# Patient Record
Sex: Male | Born: 1935
Health system: Southern US, Community
[De-identification: ages and names within clinical notes are randomized; demographics above are authoritative.]

## PROBLEM LIST (undated history)

## (undated) DIAGNOSIS — I10 Essential (primary) hypertension: Secondary | ICD-10-CM

## (undated) DIAGNOSIS — C801 Malignant (primary) neoplasm, unspecified: Secondary | ICD-10-CM

## (undated) DIAGNOSIS — N189 Chronic kidney disease, unspecified: Secondary | ICD-10-CM

## (undated) DIAGNOSIS — E785 Hyperlipidemia, unspecified: Secondary | ICD-10-CM

## (undated) DIAGNOSIS — J189 Pneumonia, unspecified organism: Secondary | ICD-10-CM

## (undated) DIAGNOSIS — E039 Hypothyroidism, unspecified: Secondary | ICD-10-CM

## (undated) DIAGNOSIS — M549 Dorsalgia, unspecified: Secondary | ICD-10-CM

## (undated) HISTORY — DX: Dorsalgia, unspecified: M54.9

## (undated) HISTORY — PX: BACK SURGERY: SHX140

## (undated) HISTORY — PX: COLONOSCOPY: SHX174

## (undated) HISTORY — DX: Chronic kidney disease, unspecified: N18.9

## (undated) HISTORY — PX: CARPAL TUNNEL RELEASE: SHX101

## (undated) HISTORY — DX: Hyperlipidemia, unspecified: E78.5

## (undated) HISTORY — PX: OTHER SURGICAL HISTORY: SHX169

## (undated) HISTORY — PX: HERNIA REPAIR: SHX51

---

## 1998-10-25 ENCOUNTER — Encounter: Payer: Self-pay | Admitting: Family Medicine

## 1998-10-25 ENCOUNTER — Ambulatory Visit (HOSPITAL_COMMUNITY): Admission: RE | Admit: 1998-10-25 | Discharge: 1998-10-25 | Payer: Self-pay | Admitting: Family Medicine

## 1998-11-23 ENCOUNTER — Encounter: Payer: Self-pay | Admitting: Neurosurgery

## 1998-11-23 ENCOUNTER — Ambulatory Visit (HOSPITAL_COMMUNITY): Admission: RE | Admit: 1998-11-23 | Discharge: 1998-11-23 | Payer: Self-pay | Admitting: Neurosurgery

## 1998-11-30 ENCOUNTER — Ambulatory Visit (HOSPITAL_COMMUNITY): Admission: RE | Admit: 1998-11-30 | Discharge: 1998-11-30 | Payer: Self-pay | Admitting: Neurosurgery

## 1998-11-30 ENCOUNTER — Encounter: Payer: Self-pay | Admitting: Neurosurgery

## 1998-12-15 ENCOUNTER — Ambulatory Visit (HOSPITAL_COMMUNITY): Admission: RE | Admit: 1998-12-15 | Discharge: 1998-12-15 | Payer: Self-pay | Admitting: Neurosurgery

## 2002-12-15 ENCOUNTER — Ambulatory Visit (HOSPITAL_COMMUNITY): Admission: RE | Admit: 2002-12-15 | Discharge: 2002-12-15 | Payer: Self-pay | Admitting: Gastroenterology

## 2002-12-15 ENCOUNTER — Encounter (INDEPENDENT_AMBULATORY_CARE_PROVIDER_SITE_OTHER): Payer: Self-pay | Admitting: *Deleted

## 2008-07-21 ENCOUNTER — Ambulatory Visit (HOSPITAL_COMMUNITY): Admission: RE | Admit: 2008-07-21 | Discharge: 2008-07-21 | Payer: Self-pay | Admitting: Urology

## 2008-07-22 ENCOUNTER — Ambulatory Visit: Admission: RE | Admit: 2008-07-22 | Discharge: 2008-10-20 | Payer: Self-pay | Admitting: Radiation Oncology

## 2008-09-02 ENCOUNTER — Ambulatory Visit (HOSPITAL_BASED_OUTPATIENT_CLINIC_OR_DEPARTMENT_OTHER): Admission: RE | Admit: 2008-09-02 | Discharge: 2008-09-02 | Payer: Self-pay | Admitting: Urology

## 2009-12-30 ENCOUNTER — Ambulatory Visit (HOSPITAL_COMMUNITY): Admission: RE | Admit: 2009-12-30 | Discharge: 2009-12-30 | Payer: Self-pay | Admitting: General Surgery

## 2011-03-27 LAB — DIFFERENTIAL
Eosinophils Relative: 4 % (ref 0–5)
Lymphocytes Relative: 22 % (ref 12–46)
Lymphs Abs: 2.1 10*3/uL (ref 0.7–4.0)
Monocytes Absolute: 0.8 10*3/uL (ref 0.1–1.0)

## 2011-03-27 LAB — BASIC METABOLIC PANEL
GFR calc Af Amer: >60 mL/min (ref >60)
GFR calc non Af Amer: 54 mL/min — ABNORMAL LOW (ref >60)
Potassium: 4.8 mEq/L (ref 3.5–5.1)
Sodium: 141 mEq/L (ref 135–145)

## 2011-03-27 LAB — CBC
HCT: 42.7 % (ref 39.0–52.0)
Hemoglobin: 14.3 g/dL (ref 13.0–17.0)
Platelets: 253 10*3/uL (ref 150–400)
WBC: 9.7 10*3/uL (ref 4.0–10.5)

## 2011-05-09 NOTE — Op Note (Signed)
NAMEJADARRIAN, Keith Wiley               ACCOUNT NO.:  [id number]   MEDICAL RECORD NO.:  [id number]          PATIENT TYPE:  AMB   LOCATION:  [facility]                         FACILITY:  [facility]:  Corky Downs, M.D.DATE OF BIRTH:  1936-03-13   DATE OF PROCEDURE:  09/02/2008  DATE OF DISCHARGE:                               OPERATIVE REPORT   PREOPERATIVE DIAGNOSIS:  T2a Gleason 4 + 3 adenocarcinoma of prostate.   POSTOPERATIVE DIAGNOSIS:  T2a Gleason 4 + 3 adenocarcinoma of prostate.   OPERATION:  Prostate brachytherapy and cystoscopy.   ANESTHESIA:  General.   SURGEON:  Corky Downs, M.D.   ASSISTANT:  Arloa Koh, M.D. .   BRIEF HISTORY:  This 75 year old white male was admitted with a clinical  T2a Gleason 4 +3 adenocarcinoma of prostate for prostate brachytherapy  as definitive treatment.  His PSA was 6.2 in December.  He had a 40-gram  prostate.  Most of the biopsies were positive on both apex.  He had  previous biopsy 12 years ago that was negative.  He did have good  general health otherwise.   The patient was placed in dorsal lithotomy position, after satisfactory  induction of general endotracheal anesthesia, was prepped and draped  with Betadine and given IV Cipro.  Time out was then performed, and the  rectal tube, the Foley catheter and the 7.5 MHz prostate ultrasound  probe was inserted in the rectum.  Dr. Valere Dross then proceeded to plan the  treatment for the brachytherapy.   After this was done, I was called to the OR and began placing the  needles per protocol, sparing the rectum and the urethra as much as  possible.  A total of 68 seeds with 25 needles of 125 were used which  seemed to give a nice pattern on the post-therapy films.  This was done  with ultrasound guidance.  At the completion, the Foley catheter was  then removed, and a flexible cystoscope used to inspect the anterior  urethra.  No seeds were seen in the prostatic urethra.  The  bladder was  entered.  Bladder was also free of any seeds; even on the retrospective  view of the bladder neck, I could not see any loose seeds.  The bladder  was normal, 2+ trabeculated.  Scope removed, and a #16 Foley catheter  was replaced, and the patient taken to the recovery room in good  condition.  He will be later discharged as an outpatient to have his  catheter removed in 48 hours and come back to the office in 3 weeks for  both Dr. Valere Dross and Dr. Serita Butcher.      Corky Downs, M.D.  Electronically Signed    HMK/MEDQ  D:  09/02/2008  T:  09/02/2008  Job:  [id number]

## 2011-05-12 NOTE — Op Note (Signed)
   NAME:  Keith Wiley, Keith Wiley                         ACCOUNT NO.:  [id number]   MEDICAL RECORD NO.:  [id number]                   PATIENT TYPE:  AMB   LOCATION:  ENDO                                 FACILITY:  [facility]   PHYSICIAN:  Jeryl Columbia, M.D.                 DATE OF BIRTH:  07-27-1936   DATE OF PROCEDURE:  12/15/2002  DATE OF DISCHARGE:                                 OPERATIVE REPORT   PROCEDURE PERFORMED:  Colonoscopy with biopsy.   ENDOSCOPIST:  Jeryl Columbia, M.D.   INDICATIONS FOR PROCEDURE:  Screening and bright red blood per rectum.  Consent was signed after the risks, benefits, methods and options were  thoroughly discussed in the office.   MEDICINES USED:  Demerol 50 mg, Versed 4 mg.   DESCRIPTION OF PROCEDURE:  Rectal inspection was pertinent for external  hemorrhoids, small.  Digital exam was negative.  A video colonoscope was  inserted and easily advanced around the colon to the cecum.  This did  require some abdominal pressure but no position changes.  On insertion, no  abnormalities were seen.  The cecum was identified by the appendiceal  orifice and the ileocecal valve.  The scope was slowly withdrawn.  The prep  was adequate.  There was some liquid stool that required washing and  suctioning.  The cecum, ascending and majority of the transverse were  normal.  Beginning at about the splenic flexure, a few tiny hyperplastic  appearing polyps were seen both the splenic flexure, descending and sigmoid.  They were cold biopsied and put in the same container.  No other  abnormalities were seen as we slowly withdrew back to the rectum.  Once back  in the rectum, the scope was retroflexed pertinent for some internal  hemorrhoids.  Scope was straightened and readvanced a short ways up the left  side of the colon, air was suctioned, scope removed.  The patient tolerated  the procedure well without obvious complication.   ENDOSCOPIC DIAGNOSIS:  1. Internal and external  small hemorrhoids.  2. Left-sided probable hyperplastic appearing polyps cold biopsied.  3. Otherwise within normal limits to the cecum.   PLAN:  Await pathology to determine future colonic screening, yearly rectals  and guaiacs per Dr. Laurance Flatten, happy to see back p.r.n.                                               Jeryl Columbia, M.D.   MEM/MEDQ  D:  12/15/2002  T:  12/15/2002  Job:  [id number]   cc:   Chipper Herb, M.D.  [facility]  [place] [address]  Fax: [phone]

## 2011-09-27 LAB — COMPREHENSIVE METABOLIC PANEL
Albumin: 4
BUN: 25 — ABNORMAL HIGH
Chloride: 105
Creatinine, Ser: 1.5
GFR calc non Af Amer: 46 — ABNORMAL LOW
Glucose, Bld: 92
Total Bilirubin: 0.8

## 2011-09-27 LAB — URINALYSIS, ROUTINE W REFLEX MICROSCOPIC
Bilirubin Urine: NEGATIVE
Hgb urine dipstick: NEGATIVE
Ketones, ur: NEGATIVE
Nitrite: NEGATIVE
Protein, ur: NEGATIVE
Specific Gravity, Urine: 1.018
Urobilinogen, UA: 0.2

## 2011-09-27 LAB — CBC
HCT: 45.9
Hemoglobin: 15.2
MCV: 88.5
Platelets: 261
WBC: 7.5

## 2011-09-27 LAB — PROTIME-INR
INR: 1
Prothrombin Time: 12.9

## 2011-09-27 LAB — APTT: aPTT: 30

## 2012-03-29 ENCOUNTER — Other Ambulatory Visit: Payer: Self-pay | Admitting: Neurosurgery

## 2012-03-29 DIAGNOSIS — M5410 Radiculopathy, site unspecified: Secondary | ICD-10-CM

## 2012-03-29 DIAGNOSIS — M542 Cervicalgia: Secondary | ICD-10-CM

## 2012-04-01 ENCOUNTER — Ambulatory Visit
Admission: RE | Admit: 2012-04-01 | Discharge: 2012-04-01 | Disposition: A | Discharge status: Home / Self Care | Payer: Medicare Other | Source: Ambulatory Visit | Attending: Neurosurgery | Admitting: Neurosurgery

## 2012-04-01 VITALS — BP 148/79 | HR 83 | Ht 67.0 in | Wt 206.0 lb

## 2012-04-01 DIAGNOSIS — M5410 Radiculopathy, site unspecified: Secondary | ICD-10-CM

## 2012-04-01 DIAGNOSIS — M542 Cervicalgia: Secondary | ICD-10-CM

## 2012-04-01 MED ORDER — IOHEXOL 300 MG/ML  SOLN
10.0000 mL | Freq: Once | INTRAMUSCULAR | Status: AC | PRN
  Administered 2012-04-01: 10 mL via INTRATHECAL

## 2012-04-01 MED ORDER — DIAZEPAM 5 MG PO TABS
5.0000 mg | ORAL_TABLET | Freq: Once | ORAL | Status: AC
  Administered 2012-04-01: 5 mg via ORAL

## 2012-04-01 NOTE — Discharge Instructions (Signed)
Myelogram Discharge Instructions  1. Go home and rest quietly for the next 24 hours.  It is important to lie flat for the next 24 hours.  Get up only to go to the restroom.  You may lie in the bed or on a couch on your back, your stomach, your left side or your right side.  You may have one pillow under your head.  You may have pillows between your knees while you are on your side or under your knees while you are on your back.  2. DO NOT drive today.  Recline the seat as far back as it will go, while still wearing your seat belt, on the way home.  3. You may get up to go to the bathroom as needed.  You may sit up for 10 minutes to eat.  You may resume your normal diet and medications unless otherwise indicated.  Drink lots of extra fluids today and tomorrow.  4. The incidence of headache, nausea, or vomiting is about 5% (one in 20 patients).  If you develop a headache, lie flat and drink plenty of fluids until the headache goes away.  Caffeinated beverages may be helpful.  If you develop severe nausea and vomiting or a headache that does not go away with flat bed rest, call [phone].  5. You may resume normal activities after your 24 hours of bed rest is over; however, do not exert yourself strongly or do any heavy lifting tomorrow.  6. Call your physician for a follow-up appointment.  The results of your myelogram will be sent directly to your physician by the following day.  7. If you have any questions or if complications develop after you arrive home, please call [phone].  Discharge instructions have been explained to the patient.  The patient, or the person responsible for the patient, fully understands these instructions.

## 2012-04-02 ENCOUNTER — Other Ambulatory Visit (HOSPITAL_COMMUNITY): Payer: Self-pay | Admitting: Diagnostic Radiology

## 2012-05-02 ENCOUNTER — Other Ambulatory Visit: Payer: Self-pay | Admitting: Neurosurgery

## 2012-05-06 ENCOUNTER — Encounter (HOSPITAL_COMMUNITY): Payer: Self-pay

## 2012-05-07 ENCOUNTER — Encounter (HOSPITAL_COMMUNITY)
Admission: RE | Admit: 2012-05-07 | Discharge: 2012-05-07 | Disposition: A | Discharge status: Home / Self Care | Payer: Medicare Other | Source: Ambulatory Visit | Attending: Anesthesiology | Admitting: Anesthesiology

## 2012-05-07 ENCOUNTER — Encounter (HOSPITAL_COMMUNITY)
Admission: RE | Admit: 2012-05-07 | Discharge: 2012-05-07 | Disposition: A | Discharge status: Home / Self Care | Payer: Medicare Other | Source: Ambulatory Visit | Attending: Neurosurgery | Admitting: Neurosurgery

## 2012-05-07 ENCOUNTER — Encounter (HOSPITAL_COMMUNITY): Payer: Self-pay

## 2012-05-07 HISTORY — DX: Essential (primary) hypertension: I10

## 2012-05-07 HISTORY — DX: Hypothyroidism, unspecified: E03.9

## 2012-05-07 HISTORY — DX: Malignant (primary) neoplasm, unspecified: C80.1

## 2012-05-07 LAB — BASIC METABOLIC PANEL
Calcium: 10 mg/dL (ref 8.4–10.5)
GFR calc Af Amer: 31 mL/min — ABNORMAL LOW (ref >90)
GFR calc non Af Amer: 27 mL/min — ABNORMAL LOW (ref >90)
Sodium: 143 mEq/L (ref 135–145)

## 2012-05-07 LAB — CBC
Platelets: 293 10*3/uL (ref 150–400)
RBC: 4.88 MIL/uL (ref 4.22–5.81)
WBC: 19.8 10*3/uL — ABNORMAL HIGH (ref 4.0–10.5)

## 2012-05-07 LAB — SURGICAL PCR SCREEN: Staphylococcus aureus: POSITIVE — AB

## 2012-05-07 NOTE — Pre-Procedure Instructions (Signed)
[place]  05/07/2012   Your procedure is scheduled on:  05/08/12  Report to [facility] at 1000 AM.  Call this number if you have problems the morning of surgery: [phone]   Remember:   Do not eat food:After Midnight.  May have clear liquids: up to 4 Hours before arrival.  Clear liquids include soda, tea, black coffee, apple or grape juice, broth.  Take these medicines the morning of surgery with A SIP OF WATER: norvasc,synthroid   Do not wear jewelry, make-up or nail polish.  Do not wear lotions, powders, or perfumes. You may wear deodorant.  Do not shave 48 hours prior to surgery. Men may shave face and neck.  Do not bring valuables to the hospital.  Contacts, dentures or bridgework may not be worn into surgery.  Leave suitcase in the car. After surgery it may be brought to your room.  For patients admitted to the hospital, checkout time is 11:00 AM the day of discharge.   Patients discharged the day of surgery will not be allowed to drive home.  Name and phone number of your driver: family  Special Instructions: CHG Shower Use Special Wash: 1/2 bottle night before surgery and 1/2 bottle morning of surgery.   Please read over the following fact sheets that you were given: Pain Booklet, Coughing and Deep Breathing, MRSA Information and Surgical Site Infection Prevention

## 2012-05-14 ENCOUNTER — Encounter (HOSPITAL_COMMUNITY)
Admission: RE | Disposition: A | Discharge status: Home / Self Care | Payer: Self-pay | Source: Ambulatory Visit | Attending: Neurosurgery

## 2012-05-14 ENCOUNTER — Inpatient Hospital Stay (HOSPITAL_COMMUNITY): Payer: Medicare Other

## 2012-05-14 ENCOUNTER — Encounter (HOSPITAL_COMMUNITY): Payer: Self-pay | Admitting: *Deleted

## 2012-05-14 ENCOUNTER — Inpatient Hospital Stay (HOSPITAL_COMMUNITY): Payer: Medicare Other | Admitting: *Deleted

## 2012-05-14 ENCOUNTER — Inpatient Hospital Stay (HOSPITAL_COMMUNITY)
Admission: RE | Admit: 2012-05-14 | Discharge: 2012-05-17 | DRG: 473 | Disposition: A | Discharge status: Home / Self Care | Payer: Medicare Other | Source: Ambulatory Visit | Attending: Neurosurgery | Admitting: Neurosurgery

## 2012-05-14 DIAGNOSIS — M109 Gout, unspecified: Secondary | ICD-10-CM | POA: Diagnosis present

## 2012-05-14 DIAGNOSIS — Z79899 Other long term (current) drug therapy: Secondary | ICD-10-CM

## 2012-05-14 DIAGNOSIS — I10 Essential (primary) hypertension: Secondary | ICD-10-CM | POA: Diagnosis present

## 2012-05-14 DIAGNOSIS — M5412 Radiculopathy, cervical region: Secondary | ICD-10-CM | POA: Diagnosis present

## 2012-05-14 DIAGNOSIS — Z8546 Personal history of malignant neoplasm of prostate: Secondary | ICD-10-CM

## 2012-05-14 DIAGNOSIS — M5030 Other cervical disc degeneration, unspecified cervical region: Principal | ICD-10-CM | POA: Diagnosis present

## 2012-05-14 DIAGNOSIS — Z01810 Encounter for preprocedural cardiovascular examination: Secondary | ICD-10-CM

## 2012-05-14 DIAGNOSIS — Z7982 Long term (current) use of aspirin: Secondary | ICD-10-CM

## 2012-05-14 DIAGNOSIS — Z87891 Personal history of nicotine dependence: Secondary | ICD-10-CM

## 2012-05-14 DIAGNOSIS — Z01812 Encounter for preprocedural laboratory examination: Secondary | ICD-10-CM

## 2012-05-14 DIAGNOSIS — E039 Hypothyroidism, unspecified: Secondary | ICD-10-CM | POA: Diagnosis present

## 2012-05-14 SURGERY — ANTERIOR CERVICAL DECOMPRESSION/DISCECTOMY FUSION 4 LEVELS
Anesthesia: General | Site: Neck | Wound class: Clean

## 2012-05-14 MED ORDER — CEFAZOLIN SODIUM-DEXTROSE 2-3 GM-% IV SOLR
INTRAVENOUS | Status: AC
  Filled 2012-05-14: qty 50

## 2012-05-14 MED ORDER — ACETAMINOPHEN 325 MG PO TABS: 650.0000 mg | ORAL_TABLET | ORAL | Status: DC | PRN

## 2012-05-14 MED ORDER — VECURONIUM BROMIDE 10 MG IV SOLR
INTRAVENOUS | Status: DC | PRN
  Administered 2012-05-14 (×2): 5 mg via INTRAVENOUS
  Administered 2012-05-14: 1 mg via INTRAVENOUS
  Administered 2012-05-14: 2 mg via INTRAVENOUS
  Administered 2012-05-14 (×2): 5 mg via INTRAVENOUS

## 2012-05-14 MED ORDER — ACETAMINOPHEN 650 MG RE SUPP: 650.0000 mg | RECTAL | Status: DC | PRN

## 2012-05-14 MED ORDER — PROPOFOL 10 MG/ML IV EMUL
INTRAVENOUS | Status: DC | PRN
  Administered 2012-05-14: 200 mg via INTRAVENOUS

## 2012-05-14 MED ORDER — LACTATED RINGERS IV SOLN
INTRAVENOUS | Status: DC | PRN
  Administered 2012-05-14 (×3): via INTRAVENOUS

## 2012-05-14 MED ORDER — HYDROCHLOROTHIAZIDE 25 MG PO TABS
25.0000 mg | ORAL_TABLET | Freq: Every day | ORAL | Status: DC
  Administered 2012-05-14 – 2012-05-17 (×4): 25 mg via ORAL
  Filled 2012-05-14 (×4): qty 1

## 2012-05-14 MED ORDER — THROMBIN 20000 UNITS EX KIT
PACK | CUTANEOUS | Status: DC | PRN
  Administered 2012-05-14: 13:00:00 via TOPICAL

## 2012-05-14 MED ORDER — ONDANSETRON HCL 4 MG/2ML IJ SOLN: 4.0000 mg | INTRAMUSCULAR | Status: DC | PRN

## 2012-05-14 MED ORDER — PHENOL 1.4 % MT LIQD: 1.0000 | OROMUCOSAL | Status: DC | PRN

## 2012-05-14 MED ORDER — MENTHOL 3 MG MT LOZG: 1.0000 | LOZENGE | OROMUCOSAL | Status: DC | PRN

## 2012-05-14 MED ORDER — MIDAZOLAM HCL 5 MG/5ML IJ SOLN
INTRAMUSCULAR | Status: DC | PRN
  Administered 2012-05-14: 2 mg via INTRAVENOUS

## 2012-05-14 MED ORDER — THROMBIN 5000 UNITS EX SOLR
OROMUCOSAL | Status: DC | PRN
  Administered 2012-05-14 (×2): via TOPICAL

## 2012-05-14 MED ORDER — VITAMIN C 500 MG PO TABS
500.0000 mg | ORAL_TABLET | Freq: Every day | ORAL | Status: DC
  Administered 2012-05-14 – 2012-05-17 (×4): 500 mg via ORAL
  Filled 2012-05-14 (×4): qty 1

## 2012-05-14 MED ORDER — CEFAZOLIN SODIUM 1-5 GM-% IV SOLN
INTRAVENOUS | Status: DC | PRN
  Administered 2012-05-14: 1 g via INTRAVENOUS
  Administered 2012-05-14: 2 g via INTRAVENOUS

## 2012-05-14 MED ORDER — HEMOSTATIC AGENTS (NO CHARGE) OPTIME: TOPICAL | Status: DC | PRN

## 2012-05-14 MED ORDER — THROMBIN 5000 UNITS EX SOLR: CUTANEOUS | Status: DC | PRN

## 2012-05-14 MED ORDER — CEFAZOLIN SODIUM 1-5 GM-% IV SOLN
1.0000 g | Freq: Three times a day (TID) | INTRAVENOUS | Status: AC
  Administered 2012-05-14 – 2012-05-15 (×2): 1 g via INTRAVENOUS
  Filled 2012-05-14 (×2): qty 50

## 2012-05-14 MED ORDER — SIMVASTATIN 20 MG PO TABS
20.0000 mg | ORAL_TABLET | Freq: Every day | ORAL | Status: DC
  Administered 2012-05-15 – 2012-05-16 (×2): 20 mg via ORAL
  Filled 2012-05-14 (×4): qty 1

## 2012-05-14 MED ORDER — DEXAMETHASONE SODIUM PHOSPHATE 4 MG/ML IJ SOLN
4.0000 mg | Freq: Four times a day (QID) | INTRAMUSCULAR | Status: DC
  Administered 2012-05-15 (×2): 4 mg via INTRAVENOUS
  Filled 2012-05-14 (×14): qty 1

## 2012-05-14 MED ORDER — SODIUM CHLORIDE 0.9 % IJ SOLN: 3.0000 mL | INTRAMUSCULAR | Status: DC | PRN

## 2012-05-14 MED ORDER — OXYCODONE-ACETAMINOPHEN 5-325 MG PO TABS
1.0000 | ORAL_TABLET | ORAL | Status: DC | PRN
  Administered 2012-05-15: 1 via ORAL
  Filled 2012-05-14: qty 2

## 2012-05-14 MED ORDER — SUCCINYLCHOLINE CHLORIDE 20 MG/ML IJ SOLN
INTRAMUSCULAR | Status: DC | PRN
  Administered 2012-05-14: 100 mg via INTRAVENOUS

## 2012-05-14 MED ORDER — SODIUM CHLORIDE 0.9 % IJ SOLN
3.0000 mL | Freq: Two times a day (BID) | INTRAMUSCULAR | Status: DC
  Administered 2012-05-14: 3 mL via INTRAVENOUS
  Administered 2012-05-15: 09:00:00 via INTRAVENOUS
  Administered 2012-05-15 – 2012-05-17 (×4): 3 mL via INTRAVENOUS

## 2012-05-14 MED ORDER — ALLOPURINOL 100 MG PO TABS
100.0000 mg | ORAL_TABLET | Freq: Every day | ORAL | Status: DC
  Administered 2012-05-14 – 2012-05-17 (×4): 100 mg via ORAL
  Filled 2012-05-14 (×4): qty 1

## 2012-05-14 MED ORDER — CEFAZOLIN SODIUM 1-5 GM-% IV SOLN
INTRAVENOUS | Status: AC
  Filled 2012-05-14: qty 50

## 2012-05-14 MED ORDER — ACETAMINOPHEN 10 MG/ML IV SOLN
INTRAVENOUS | Status: AC
  Administered 2012-05-14: 1000 mg via INTRAVENOUS
  Filled 2012-05-14: qty 100

## 2012-05-14 MED ORDER — AMLODIPINE BESYLATE 5 MG PO TABS
5.0000 mg | ORAL_TABLET | Freq: Every day | ORAL | Status: DC
  Administered 2012-05-14 – 2012-05-17 (×4): 5 mg via ORAL
  Filled 2012-05-14 (×4): qty 1

## 2012-05-14 MED ORDER — DEXAMETHASONE 4 MG PO TABS
4.0000 mg | ORAL_TABLET | Freq: Four times a day (QID) | ORAL | Status: DC
  Administered 2012-05-14 – 2012-05-17 (×9): 4 mg via ORAL
  Filled 2012-05-14 (×15): qty 1

## 2012-05-14 MED ORDER — SIMVASTATIN 40 MG PO TABS: 40.0000 mg | ORAL_TABLET | Freq: Every day | ORAL | Status: DC

## 2012-05-14 MED ORDER — LEVOTHYROXINE SODIUM 50 MCG PO TABS
50.0000 ug | ORAL_TABLET | Freq: Every day | ORAL | Status: DC
  Administered 2012-05-14 – 2012-05-17 (×4): 50 ug via ORAL
  Filled 2012-05-14 (×4): qty 1

## 2012-05-14 MED ORDER — SODIUM CHLORIDE 0.9 % IV SOLN: 250.0000 mL | INTRAVENOUS | Status: DC

## 2012-05-14 MED ORDER — OLMESARTAN MEDOXOMIL-HCTZ 40-25 MG PO TABS: 1.0000 | ORAL_TABLET | Freq: Every day | ORAL | Status: DC

## 2012-05-14 MED ORDER — GLYCOPYRROLATE 0.2 MG/ML IJ SOLN
INTRAMUSCULAR | Status: DC | PRN
  Administered 2012-05-14: .6 mg via INTRAVENOUS

## 2012-05-14 MED ORDER — PREDNISONE 20 MG PO TABS
40.0000 mg | ORAL_TABLET | Freq: Every day | ORAL | Status: DC
  Administered 2012-05-14: 40 mg via ORAL
  Filled 2012-05-14 (×2): qty 2

## 2012-05-14 MED ORDER — 0.9 % SODIUM CHLORIDE (POUR BTL) OPTIME
TOPICAL | Status: DC | PRN
  Administered 2012-05-14 (×2): 1000 mL

## 2012-05-14 MED ORDER — SODIUM CHLORIDE 0.9 % IV SOLN
INTRAVENOUS | Status: DC
  Administered 2012-05-14: 20:00:00 via INTRAVENOUS

## 2012-05-14 MED ORDER — FENTANYL CITRATE 0.05 MG/ML IJ SOLN
INTRAMUSCULAR | Status: DC | PRN
  Administered 2012-05-14 (×2): 50 ug via INTRAVENOUS
  Administered 2012-05-14: 100 ug via INTRAVENOUS
  Administered 2012-05-14: 50 ug via INTRAVENOUS
  Administered 2012-05-14 (×2): 100 ug via INTRAVENOUS
  Administered 2012-05-14: 50 ug via INTRAVENOUS

## 2012-05-14 MED ORDER — MORPHINE SULFATE 2 MG/ML IJ SOLN
2.0000 mg | INTRAMUSCULAR | Status: DC | PRN
  Administered 2012-05-14: 2 mg via INTRAVENOUS
  Filled 2012-05-14: qty 1

## 2012-05-14 MED ORDER — DEXAMETHASONE SODIUM PHOSPHATE 4 MG/ML IJ SOLN
INTRAMUSCULAR | Status: DC | PRN
  Administered 2012-05-14: 10 mg via INTRAVENOUS

## 2012-05-14 MED ORDER — PHENYLEPHRINE HCL 10 MG/ML IJ SOLN
INTRAMUSCULAR | Status: DC | PRN
  Administered 2012-05-14 (×2): 40 ug via INTRAVENOUS

## 2012-05-14 MED ORDER — ONDANSETRON HCL 4 MG/2ML IJ SOLN
INTRAMUSCULAR | Status: DC | PRN
  Administered 2012-05-14: 4 mg via INTRAVENOUS

## 2012-05-14 MED ORDER — ZOLPIDEM TARTRATE 5 MG PO TABS: 5.0000 mg | ORAL_TABLET | Freq: Every evening | ORAL | Status: DC | PRN

## 2012-05-14 MED ORDER — IRBESARTAN 300 MG PO TABS
300.0000 mg | ORAL_TABLET | Freq: Every day | ORAL | Status: DC
  Administered 2012-05-14 – 2012-05-17 (×4): 300 mg via ORAL
  Filled 2012-05-14 (×4): qty 1

## 2012-05-14 MED ORDER — NEOSTIGMINE METHYLSULFATE 1 MG/ML IJ SOLN
INTRAMUSCULAR | Status: DC | PRN
  Administered 2012-05-14: 4 mg via INTRAVENOUS

## 2012-05-14 SURGICAL SUPPLY — 59 items
BANDAGE GAUZE ELAST BULKY 4 IN (GAUZE/BANDAGES/DRESSINGS) ×4 IMPLANT
BENZOIN TINCTURE PRP APPL 2/3 (GAUZE/BANDAGES/DRESSINGS) ×2 IMPLANT
BIT DRILL SM SPINE QC 14 (BIT) ×2 IMPLANT
BLADE ULTRA TIP 2M (BLADE) ×2 IMPLANT
BUR BARREL STRAIGHT FLUTE 4.0 (BURR) ×2 IMPLANT
BUR MATCHSTICK NEURO 3.0 LAGG (BURR) ×2 IMPLANT
CANISTER SUCTION 2500CC (MISCELLANEOUS) ×2 IMPLANT
CLOTH BEACON ORANGE TIMEOUT ST (SAFETY) ×2 IMPLANT
CONT SPEC 4OZ CLIKSEAL STRL BL (MISCELLANEOUS) ×2 IMPLANT
COVER MAYO STAND STRL (DRAPES) ×2 IMPLANT
DRAIN JACKSON PRATT 10MM FLAT (MISCELLANEOUS) ×2 IMPLANT
DRAPE LAPAROTOMY 100X72 PEDS (DRAPES) ×2 IMPLANT
DRAPE MICROSCOPE LEICA (MISCELLANEOUS) ×2 IMPLANT
DRAPE POUCH INSTRU U-SHP 10X18 (DRAPES) ×2 IMPLANT
DRAPE PROXIMA HALF (DRAPES) ×4 IMPLANT
DURAPREP 6ML APPLICATOR 50/CS (WOUND CARE) ×2 IMPLANT
ELECT CAUTERY BLADE 6.4 (BLADE) ×2 IMPLANT
ELECT REM PT RETURN 9FT ADLT (ELECTROSURGICAL) ×2
ELECTRODE REM PT RTRN 9FT ADLT (ELECTROSURGICAL) ×1 IMPLANT
EVACUATOR SILICONE 100CC (DRAIN) ×2 IMPLANT
GAUZE SPONGE 4X4 16PLY XRAY LF (GAUZE/BANDAGES/DRESSINGS) ×4 IMPLANT
GLOVE BIOGEL M 8.0 STRL (GLOVE) ×2 IMPLANT
GLOVE BIOGEL PI IND STRL 8 (GLOVE) ×1 IMPLANT
GLOVE BIOGEL PI INDICATOR 8 (GLOVE) ×1
GLOVE ECLIPSE 7.5 STRL STRAW (GLOVE) ×8 IMPLANT
GLOVE EXAM NITRILE LRG STRL (GLOVE) IMPLANT
GLOVE EXAM NITRILE MD LF STRL (GLOVE) ×2 IMPLANT
GLOVE EXAM NITRILE XL STR (GLOVE) IMPLANT
GLOVE EXAM NITRILE XS STR PU (GLOVE) IMPLANT
GOWN BRE IMP SLV AUR LG STRL (GOWN DISPOSABLE) IMPLANT
GOWN BRE IMP SLV AUR XL STRL (GOWN DISPOSABLE) IMPLANT
GOWN STRL REIN 2XL LVL4 (GOWN DISPOSABLE) IMPLANT
HEAD HALTER (SOFTGOODS) ×2 IMPLANT
HEMOSTAT POWDER KIT SURGIFOAM (HEMOSTASIS) ×4 IMPLANT
KIT BASIN OR (CUSTOM PROCEDURE TRAY) ×2 IMPLANT
KIT ROOM TURNOVER OR (KITS) ×2 IMPLANT
NEEDLE SPNL 22GX3.5 QUINCKE BK (NEEDLE) ×4 IMPLANT
NS IRRIG 1000ML POUR BTL (IV SOLUTION) ×4 IMPLANT
PACK LAMINECTOMY NEURO (CUSTOM PROCEDURE TRAY) ×2 IMPLANT
PATTIES SURGICAL .5 X1 (DISPOSABLE) ×2 IMPLANT
PENCIL BUTTON HOLSTER BLD 10FT (ELECTRODE) ×2 IMPLANT
PLATE ANT CERV XTEND 4 LV 63 (Plate) ×2 IMPLANT
PUTTY DBX 1CC (Putty) ×2 IMPLANT
PUTTY DBX 1CC DEPUY (Putty) ×1 IMPLANT
RUBBERBAND STERILE (MISCELLANEOUS) ×4 IMPLANT
SCREW XTD VAR 4.2 SELF TAP (Screw) ×4 IMPLANT
SCREW XTD VAR 4.2 SELF TAP 12 (Screw) ×8 IMPLANT
SPACER COLONIAL SZ 9-11X12-7 (Spacer) ×4 IMPLANT
SPONGE GAUZE 4X4 12PLY (GAUZE/BANDAGES/DRESSINGS) ×2 IMPLANT
SPONGE INTESTINAL PEANUT (DISPOSABLE) ×4 IMPLANT
SPONGE SURGIFOAM ABS GEL 100 (HEMOSTASIS) ×2 IMPLANT
STRIP CLOSURE SKIN 1/2X4 (GAUZE/BANDAGES/DRESSINGS) ×2 IMPLANT
STRIP ILIUM TRICORT 2.2CMX60MM (Neuro Prosthesis/Implant) ×2 IMPLANT
SUT VIC AB 3-0 SH 8-18 (SUTURE) ×4 IMPLANT
SYR 20ML ECCENTRIC (SYRINGE) ×2 IMPLANT
TAPE CLOTH SURG 4X10 WHT LF (GAUZE/BANDAGES/DRESSINGS) ×2 IMPLANT
TOWEL OR 17X24 6PK STRL BLUE (TOWEL DISPOSABLE) IMPLANT
TOWEL OR 17X26 10 PK STRL BLUE (TOWEL DISPOSABLE) IMPLANT
WATER STERILE IRR 1000ML POUR (IV SOLUTION) ×2 IMPLANT

## 2012-05-14 NOTE — H&P (Signed)
Keith Wiley is an 76 y.o. male.   Chief Complaint: neck pain HPI: history of neck pain with radiation to the left upper extremity,which going on for at least 3 months influenza 1999 he had a posterior decompression. He feels that the pain and weakness is getting worse and wants to go ahead with surgery  Past Medical History  Diagnosis Date  . Hypothyroidism   . Gout   . Cancer     prostrate  . Hypertension     dr Redge Gainer  pcp    Past Surgical History  Procedure Date  . Carpal tunnel release   . Back surgery     cervical   1989  . Prostrate     seeds inplant   2009  . Hernia repair     No family history on file. Social History:  reports that he quit smoking about 35 years ago. His smoking use included Cigarettes. He has a 20 pack-year smoking history. He has never used smokeless tobacco. He reports that he does not drink alcohol or use illicit drugs.  Allergies: No Known Allergies  No prescriptions prior to admission    No results found for this or any previous visit (from the past 48 hour(s)). No results found.  Review of Systems  Constitutional: Negative.   HENT: Positive for neck pain.   Eyes: Negative.   Respiratory: Negative.   Cardiovascular:       Arterial hypertension  Genitourinary: Negative.   Skin: Negative.   Neurological: Positive for focal weakness.  Endo/Heme/Allergies: Negative.   Psychiatric/Behavioral: Negative.     There were no vitals taken for this visit. Physical Exam hent, nl. Neck,decrease of flexibility associated with pain cv,nl. Lungs, ronchii. Abdomen, nl. Extremities, nl NEURO weakness of deltoids. Dtr, nl. Sensory, nl. Mri, laminectomies from 3 to 7. Severe ddd at c45, 56 67 7t1.  Assessment/Plan The procedure will be anterior decompression and fusion c4 t0 t1 with poss corpectomy of 6. Risks and benefits were fully explained to him  Shanitra Phillippi M 05/14/2012, 7:54 AM

## 2012-05-14 NOTE — Preoperative (Signed)
Beta Blockers   Reason not to administer Beta Blockers:Not Applicable 

## 2012-05-14 NOTE — Progress Notes (Signed)
Op note [id number]

## 2012-05-14 NOTE — Progress Notes (Signed)
C45,and c7t1 discectomy, corpectomy 6 and fusion c4 to t1

## 2012-05-14 NOTE — Anesthesia Preprocedure Evaluation (Addendum)
Anesthesia Evaluation  Patient identified by MRN, date of birth, ID band Patient awake    Reviewed: Allergy & Precautions, H&P , NPO status , Patient's Chart, lab work & pertinent test results  Airway Mallampati: II TM Distance: >3 FB Neck ROM: Limited    Dental  (+) Upper Dentures, Lower Dentures and Dental Advisory Given   Pulmonary neg pulmonary ROS,  breath sounds clear to auscultation        Cardiovascular hypertension, Pt. on medications Rhythm:Regular Rate:Normal     Neuro/Psych negative neurological ROS     GI/Hepatic negative GI ROS, Neg liver ROS,   Endo/Other  Hypothyroidism   Renal/GU negative Renal ROS     Musculoskeletal  (+) Arthritis -,   Abdominal (+) + obese,   Peds  Hematology negative hematology ROS (+)   Anesthesia Other Findings   Reproductive/Obstetrics                           Anesthesia Physical Anesthesia Plan  ASA: II  Anesthesia Plan: General   Post-op Pain Management:    Induction: Intravenous  Airway Management Planned: Oral ETT  Additional Equipment:   Intra-op Plan:   Post-operative Plan: Extubation in OR  Informed Consent: I have reviewed the patients History and Physical, chart, labs and discussed the procedure including the risks, benefits and alternatives for the proposed anesthesia with the patient or authorized representative who has indicated his/her understanding and acceptance.   Dental advisory given  Plan Discussed with: Anesthesiologist  Anesthesia Plan Comments:        Anesthesia Quick Evaluation

## 2012-05-14 NOTE — Anesthesia Postprocedure Evaluation (Signed)
  Anesthesia Post-op Note  Patient: Keith Wiley  Procedure(s) Performed: Procedure(s) (LRB): ANTERIOR CERVICAL DECOMPRESSION/DISCECTOMY FUSION 4 LEVELS (N/A)  Patient Location: PACU  Anesthesia Type: General  Level of Consciousness: awake, alert  and oriented  Airway and Oxygen Therapy: Patient Spontanous Breathing and Patient connected to nasal cannula oxygen  Post-op Pain: mild  Post-op Assessment: Post-op Vital signs reviewed  Post-op Vital Signs: Reviewed  Complications: No apparent anesthesia complications

## 2012-05-14 NOTE — Transfer of Care (Signed)
Immediate Anesthesia Transfer of Care Note  Patient: Keith Wiley  Procedure(s) Performed: Procedure(s) (LRB): ANTERIOR CERVICAL DECOMPRESSION/DISCECTOMY FUSION 4 LEVELS (N/A)  Patient Location: PACU  Anesthesia Type: General  Level of Consciousness: awake, alert  and oriented  Airway & Oxygen Therapy: Patient Spontanous Breathing and Patient connected to nasal cannula oxygen  Post-op Assessment: Report given to PACU RN and Post -op Vital signs reviewed and stable  Post vital signs: Reviewed and stable  Complications: No apparent anesthesia complications

## 2012-05-15 NOTE — Progress Notes (Signed)
UR COMPLETED  

## 2012-05-15 NOTE — Op Note (Signed)
NAMEDETRON, TURVEY NO.:  [id number]  MEDICAL RECORD NO.:  [id number]  LOCATION:  [id number]                         FACILITY:  [facility]  PHYSICIAN:  Leeroy Cha, M.D.   DATE OF BIRTH:  02-09-1936  DATE OF PROCEDURE:  05/14/2012 DATE OF DISCHARGE:                              OPERATIVE REPORT   PREOPERATIVE DIAGNOSIS:  Degenerative disk disease, C4 to thoracic 1 with chronic radiculopathy, left worse than the right side, status post cervical laminectomy.  PREOPERATIVE DIAGNOSIS:  Degenerative disk disease C4 to thoracic 1 with chronic radiculopathy, left worse than the right side, status post cervical laminectomy.  PROCEDURE:  Anterior 4-5 diskectomy with fusion with cage 9-mm height, C7 through thoracic 1 diskectomy, decompression of spinal cord, foraminotomy, fusion with cage of 9 mm. C6 corpectomy, decompression of spinal cord, foraminotomy to decompress the C6 and C7 nerve root bilaterally and interbody fusion with allograft.  Plate from C4 to thoracic 1.  Microscope.  SURGEON:  Leeroy Cha, M.D.  ASSISTANT:  Faythe Ghee, MD  CLINICAL HISTORY:  Mr. Maharrey is a gentleman who has been complaining of neck pain with radiation to both upper extremities, mostly going to the right upper extremity__________. The patient had failed conservative treatment. In the past, this gentlemen had posterior cervical laminectomy from c3 to C7. He developed _kyphosis at_________ 5-6 and 6-7.  The patient had lot of neck pain with poor mobility__________  of the cervical spine.  Because of the finding, surgery was advised.he and his wife knew the risks of the __________   surgery.  PROCEDURE:  The patient was taken to the OR.  After intubation, the left side of the neck was cleaned with DuraPrep. Drapes were applied. Longitudinal incision in the left side from the angle of maxilla down to the _sternum_________ was made through the skin, subcutaneous tissue,  platysma. Retraction was done laterally, and we went straight to the cervical area.  The x-ray showed that the needle was at a level of C3-C4, and then we went 1 space below to c45 __________ .  From then on, we drilled anterior ligament at the level of C4 through C5. __________ space. We opened the posterior ligament and decompression of the C5 nerve root was done bilaterally.  The left side was worse than the right side. A cage was inserted._then a discectomy at c7t1 was done with insertion of a cage at this level_________ . __________ .  Decompression of the c8 nerve roots was done. We tried to see the spaces at c56 and 67 but there was no landmark. space between those levels.  The x-ray showed no space__________ whatsoever.  Because of that, we made  A mark __________ between C5 and C7, and with the drill we shaved part ot the anterior spine __________   __________ , and we were able to see part of the C5-C6 and C6-C7 spaces  Decompression was done posteriorly, yellow ligament was also removed and we were able __________ to decompress the C6 and C7 nerve roots bilaterally. We have a good space to the spinal cord _and nerve roots_________. Allograft, was taylor to fill up the gap_. Manually traction of the neck was  done and the allograft was introduced_________  . Then, the area was irrigated.  X-ray were repeated, which showed good position of the graft. Then a plate through C4 down to thoracic 1, 6 screws was used.   .  The area was irrigated. A large drain aws left in the precervical area, __________  the wound was closed with Vicryl and Steri-Strips. Because of the long procedure, we are going to keep Mr. Bembenek   Will be in the intensive care unit overnight.          ______________________________ Leeroy Cha, M.D.     EB/MEDQ  D:  05/14/2012  T:  05/14/2012  Job:  [id number]

## 2012-05-15 NOTE — Progress Notes (Signed)
[name], male   DOB: March 09, 1936, 76 y.o.   MRN: [id number] Doing well,no weakness, drain working well. Plan, to 3000

## 2012-05-15 NOTE — Progress Notes (Signed)
Occupational Therapy Evaluation Patient Details Name: Keith Wiley MRN: [id number] DOB: Jan 10, 1936 Today's Date: 05/15/2012 Time: [id number] OT Time Calculation (min): 34 min  OT Assessment / Plan / Recommendation Clinical Impression  Pt s/p ACDF 4 levels thus affecting PLOF.  Will benefit from acute OT to address below problem list in prep for d/c home with wife.    OT Assessment  Patient needs continued OT Services    Follow Up Recommendations  No OT follow up;Supervision/Assistance - 24 hour    Barriers to Discharge      Equipment Recommendations   (to determine need for shower chair)    Recommendations for Other Services    Frequency  Min 2X/week    Precautions / Restrictions Precautions Precautions: Cervical Required Braces or Orthoses: Cervical Brace (thoracic lumbar attachment) Cervical Brace: Hard collar;Applied in supine position   Pertinent Vitals/Pain Pt reporting 1/10 pain in neck.    ADL  Upper Body Dressing: Performed;Set up Where Assessed - Upper Body Dressing: Unsupported sitting Lower Body Dressing: Performed;Min guard Where Assessed - Lower Body Dressing: Unsupported sit to stand Toilet Transfer: Simulated;Min guard Toilet Transfer Method:  (ambulating) Science writer:  (chair) Equipment Used: Gait belt (cervical collar) Transfers/Ambulation Related to ADLs: Pt ambulaed ~100 ft with min guard assist for safety. ADL Comments: Educated pt and wife on adhering to cervical precautions during ADL activities.  Discussed use of shower chair for tub at home for safety and to maximize independence with ADLs while also maintaining cervical precautions.    OT Diagnosis: Acute pain  OT Problem List: Decreased knowledge of use of DME or AE;Decreased knowledge of precautions;Pain OT Treatment Interventions: Self-care/ADL training;Therapeutic activities;Patient/family education   OT Goals Acute Rehab OT Goals OT Goal Formulation: With patient Time For  Goal Achievement: 05/29/12 Potential to Achieve Goals: Good ADL Goals Pt Will Perform Tub/Shower Transfer: Tub transfer;with supervision;Shower seat with back;Ambulation ADL Goal: Tub/Shower Transfer - Progress: Goal set today Additional ADL Goal #1: Pt will perform all 3 toileting tasks with supervision. ADL Goal: Additional Goal #1 - Progress: Goal set today Miscellaneous OT Goals Miscellaneous OT Goal #1: Pt will be able to verbalize and maintain cervical precautions during all ADL activity with supervision. OT Goal: Miscellaneous Goal #1 - Progress: Goal set today  Visit Information  Last OT Received On: 05/15/12 Assistance Needed: +1 PT/OT Co-Evaluation/Treatment: Yes    Subjective Data      Prior Functioning  Home Living Lives With: Spouse Available Help at Discharge: Family Type of Home: House Home Access: Stairs to enter [employer] of Steps: 1 Entrance Stairs-Rails: None Home Layout: One level Bathroom Shower/Tub: Tub/shower unit;Door [employer] Toilet: Standard Bathroom Accessibility: Yes How Accessible: Accessible via walker Home Adaptive Equipment: Hand-held shower hose Prior Function Level of Independence: Independent Able to Take Stairs?: Yes Driving: Yes Vocation: Retired Corporate investment banker: No difficulties Dominant Hand: Right    Cognition  Overall Cognitive Status: Appears within functional limits for tasks assessed/performed Arousal/Alertness: Awake/alert Orientation Level: Appears intact for tasks assessed Behavior During Session: [facility] for tasks performed    Extremity/Trunk Assessment Right Upper Extremity Assessment RUE ROM/Strength/Tone: Within functional levels RUE Sensation: WFL - Light Touch;WFL - Proprioception RUE Coordination: WFL - gross/fine motor Left Upper Extremity Assessment LUE ROM/Strength/Tone: Within functional levels LUE Sensation: WFL - Proprioception;WFL - Light Touch LUE Coordination: WFL - gross/fine  motor   Mobility Bed Mobility Bed Mobility: Not assessed (pt up in chair) Transfers Transfers: Sit to Stand;Stand to Sit Sit to Stand: 4: Min  guard;From chair/3-in-1;With armrests Stand to Sit: 4: Min guard;To chair/3-in-1;With armrests Details for Transfer Assistance: [place] for safety with cues for proper technique   Exercise    Balance    End of Session OT - End of Session Equipment Utilized During Treatment: Gait belt;Cervical collar Activity Tolerance: Patient tolerated treatment well Patient left: in chair;with call bell/phone within reach;with family/visitor present Nurse Communication: Mobility status  05/15/2012 Darrol Jump OTR/L Pager [phone] Office [phone]  Darrol Jump 05/15/2012, 4:33 PM

## 2012-05-15 NOTE — Progress Notes (Signed)
Orthopedic Tech Progress Note Patient Details:  Keith Wiley Nov 22, 1936 [id number] Brace order in chart review under notes completed 05/14/2012 at 6:00pm. Patient ID: [name], male   DOB: 06/18/1936, 76 y.o.   MRN: [id number]   [name] 05/15/2012, 6:47 PM

## 2012-05-15 NOTE — Progress Notes (Signed)
Clinical Social Worker received referral for New-SNF placement.  CSW reviewed chart and noted that current plan is home with family.  CSW to sign off, please re consult Unit Based CSW for referral if needed.   Dala Dock, MSW, [place]

## 2012-05-15 NOTE — Evaluation (Signed)
Physical Therapy Evaluation Patient Details Name: Keith Wiley MRN: [id number] DOB: 1936-11-27 Today's Date: 05/15/2012 Time: [id number] PT Time Calculation (min): 34 min  PT Assessment / Plan / Recommendation Clinical Impression  Pt is 76 y/o male admitted for s/p anterior lumbar fusion.  Pt moving well and will benefit from acute PT services to prepare for safe d/c home.    PT Assessment  Patient needs continued PT services    Follow Up Recommendations  No PT follow up    Barriers to Discharge        lEquipment Recommendations  None recommended by PT    Recommendations for Other Services     Frequency Min 5X/week    Precautions / Restrictions Precautions Precautions: Cervical Required Braces or Orthoses: Cervical Brace (thoracic lumbar attachment) Cervical Brace: Hard collar;Applied in supine position   Pertinent Vitals/Pain 1/10 neck pain      Mobility  Transfers Transfers: Sit to Stand;Stand to Sit Sit to Stand: 4: Min guard;From chair/3-in-1;With armrests Stand to Sit: 4: Min guard;To chair/3-in-1;With armrests Details for Transfer Assistance: Minguard for safety with cues for proper technique Ambulation/Gait Ambulation/Gait Assistance: 4: Min guard Ambulation Distance (Feet): 100 Feet Assistive device: None Ambulation/Gait Assistance Details: Minguard for safety;  Cues for proper LE placement  Gait Pattern: Right flexed knee in stance;Decreased trunk rotation;Wide base of support    Exercises     PT Diagnosis: Difficulty walking;Abnormality of gait;Generalized weakness;Acute pain  PT Problem List: Decreased knowledge of use of DME;Decreased balance;Decreased activity tolerance;Pain PT Treatment Interventions: DME instruction;Gait training;Stair training;Functional mobility training;Therapeutic activities;Therapeutic exercise;Balance training;Patient/family education   PT Goals Acute Rehab PT Goals PT Goal Formulation: With patient Time For Goal  Achievement: 05/22/12 Potential to Achieve Goals: Good Pt will Roll Supine to Right Side: with modified independence PT Goal: Rolling Supine to Right Side - Progress: Goal set today Pt will go Supine/Side to Sit: with modified independence PT Goal: Supine/Side to Sit - Progress: Goal set today Pt will Sit at Edge of Bed: Independently;1-2 min PT Goal: Sit at [employer] Of Bed - Progress: Goal set today Pt will go Sit to Supine/Side: with modified independence PT Goal: Sit to Supine/Side - Progress: Goal set today Pt will go Sit to Stand: with modified independence PT Goal: Sit to Stand - Progress: Goal set today Pt will go Stand to Sit: with modified independence PT Goal: Stand to Sit - Progress: Goal set today Pt will Ambulate: >150 feet;with modified independence;with least restrictive assistive device PT Goal: Ambulate - Progress: Goal set today Pt will Go Up / Down Stairs: 1-2 stairs;with modified independence;with least restrictive assistive device PT Goal: Up/Down Stairs - Progress: Goal set today  Visit Information  Last PT Received On: 05/15/12 Assistance Needed: +1 PT/OT Co-Evaluation/Treatment: Yes    Subjective Data  Subjective: "I'm feeling good and ready to walk." Patient Stated Goal: To go home and return playing with my great-grandson.   Prior Functioning  Home Living Lives With: Spouse Available Help at Discharge: Family Type of Home: House Home Access: Stairs to enter [employer] of Steps: 1 Entrance Stairs-Rails: None Home Layout: One level Bathroom Shower/Tub: Tub/shower unit;Door [employer] Toilet: Standard Bathroom Accessibility: Yes How Accessible: Accessible via walker Home Adaptive Equipment: Hand-held shower hose Prior Function Level of Independence: Independent Able to Take Stairs?: Yes Driving: Yes Vocation: Retired Comments: Likes to play with grandson. Communication Communication: No difficulties Dominant Hand: Right    Cognition   Overall Cognitive Status: Appears within functional limits for tasks  assessed/performed Arousal/Alertness: Awake/alert Orientation Level: Appears intact for tasks assessed Behavior During Session: [facility] for tasks performed    Extremity/Trunk Assessment Right Lower Extremity Assessment RLE ROM/Strength/Tone: Within functional levels RLE Sensation: WFL - Light Touch Left Lower Extremity Assessment LLE ROM/Strength/Tone: Within functional levels LLE Sensation: WFL - Light Touch   Balance    End of Session PT - End of Session Equipment Utilized During Treatment: Gait belt;Cervical collar Activity Tolerance: Patient tolerated treatment well Patient left: in chair;with call bell/phone within reach Nurse Communication: Mobility status   Maycel Riffe 05/15/2012, 1:12 PM Antoine Poche, [place] DPT [phone]

## 2012-05-16 MED ORDER — MAGNESIUM HYDROXIDE 400 MG/5ML PO SUSP: 30.0000 mL | Freq: Every day | ORAL | Status: DC | PRN

## 2012-05-16 NOTE — Progress Notes (Signed)
Occupational Therapy Treatment Patient Details Name: Keith Wiley MRN: [id number] DOB: 07-28-1936 Today's Date: 05/16/2012 Time: 0930-1002 OT Time Calculation (min): 32 min  OT Assessment / Plan / Recommendation Comments on Treatment Session Pt doing very well today and has met all goals.  Pt and wife able to demonstrate don/doff cervical collar with attachment correctly and safely.  Pt able to independently maintain cervical precautions during all activity.  Discussed with pt and wife need for shower chair for tub during shower at home.  Pt and wife agree that a seat would be beneficial to maximize pt's independence during shower ADL while maintaining cervical precautions.     Follow Up Recommendations  Supervision/Assistance - 24 hour;No OT follow up    Barriers to Discharge       Equipment Recommendations  Tub/shower seat (shower chair)    Recommendations for Other Services    Frequency Min 2X/week   Plan All goals met and education completed, patient discharged from OT services    Precautions / Restrictions Precautions Precautions: Cervical Precaution Comments: Handout given and reviewed precautions with patient Required Braces or Orthoses: Cervical Brace (thoracic lumbar attachment) Cervical Brace: Hard collar;Applied in supine position   Pertinent Vitals/Pain N/A    ADL  Grooming: Performed;Wash/dry face;Modified independent Where Assessed - Grooming: Unsupported standing Lower Body Dressing: Performed;Supervision/safety Where Assessed - Lower Body Dressing: Unsupported sit to stand Toilet Transfer: Performed;Modified independent Toilet Transfer Method:  (ambulating) Toilet Transfer Equipment: Regular height toilet Toileting - Clothing Manipulation and Hygiene: Performed;Modified independent Where Assessed - Toileting Clothing Manipulation and Hygiene: Sit to stand from 3-in-1 or toilet Tub/Shower Transfer: Performed;Modified independent Tub/Shower Transfer Method:  Ambulating Equipment Used: Gait belt Transfers/Ambulation Related to ADLs: Pt ambulating with mod I    OT Diagnosis:    OT Problem List:   OT Treatment Interventions:     OT Goals ADL Goals Pt Will Perform Tub/Shower Transfer: Tub transfer;with supervision;Shower seat with back;Ambulation ADL Goal: Tub/Shower Transfer - Progress: Met Additional ADL Goal #1: Pt will perform all 3 toileting tasks with supervision. ADL Goal: Additional Goal #1 - Progress: Met Miscellaneous OT Goals Miscellaneous OT Goal #1: Pt will be able to verbalize and maintain cervical precautions during all ADL activity with supervision. OT Goal: Miscellaneous Goal #1 - Progress: Met  Visit Information  Last OT Received On: 05/16/12    Subjective Data      Prior Functioning       Cognition  Overall Cognitive Status: Appears within functional limits for tasks assessed/performed Arousal/Alertness: Awake/alert Orientation Level: Appears intact for tasks assessed Behavior During Session: [facility] for tasks performed    Mobility Transfers Transfers: Stand to Sit;Sit to Stand Sit to Stand: 7: Independent;From bed;Without upper extremity assist Stand to Sit: 7: Independent;To bed   Exercises    Balance    End of Session OT - End of Session Equipment Utilized During Treatment: Gait belt;Cervical collar Activity Tolerance: Patient tolerated treatment well Patient left: in chair;with call bell/phone within reach;with family/visitor present Nurse Communication: Mobility status  05/16/2012 Darrol Jump OTR/L Pager [phone] Office [phone]   Darrol Jump 05/16/2012, 4:25 PM

## 2012-05-16 NOTE — Progress Notes (Signed)
[name], male   DOB: 02-01-1936, 76 y.o.   MRN: [id number] Very active, no pain. Still draining quite a bit.

## 2012-05-16 NOTE — Progress Notes (Signed)
Physical Therapy Treatment Patient Details Name: Keith Wiley MRN: [id number] DOB: October 02, 1936 Today's Date: 05/16/2012 Time: 0750-0808 PT Time Calculation (min): 18 min  PT Assessment / Plan / Recommendation Comments on Treatment Session  Patient making really great progress. Will sign off on acute PT    Follow Up Recommendations  No PT follow up    Barriers to Discharge        Equipment Recommendations       Recommendations for Other Services    Frequency     Plan All goals met and education completed, patient dischaged from PT services    Precautions / Restrictions Precautions Precaution Comments: Handout given and reviewed precautions with patient Required Braces or Orthoses: Cervical Brace (thoracic lumbar attachment) Cervical Brace: Hard collar;Applied in supine position   Pertinent Vitals/Pain Denied pain    Mobility  Bed Mobility Bed Mobility: Supine to Sit;Sit to Supine Transfers Sit to Stand: 7: Independent Stand to Sit: 7: Independent Ambulation/Gait Ambulation/Gait Assistance: 6: Modified independent (Device/Increase time) Ambulation Distance (Feet): 600 Feet Gait Pattern: Within Functional Limits Stairs: Yes Stairs Assistance: 6: Modified independent (Device/Increase time) Stair Management Technique: Two rails;Forwards;Alternating pattern    Exercises     PT Diagnosis:    PT Problem List:   PT Treatment Interventions:     PT Goals Acute Rehab PT Goals PT Goal: Rolling Supine to Right Side - Progress: Met PT Goal: Supine/Side to Sit - Progress: Met PT Goal: Sit at Edge Of Bed - Progress: Met PT Goal: Sit to Supine/Side - Progress: Met PT Goal: Sit to Stand - Progress: Met PT Goal: Stand to Sit - Progress: Met PT Goal: Ambulate - Progress: Met PT Goal: Up/Down Stairs - Progress: Met  Visit Information  Last PT Received On: 05/16/12 Assistance Needed: +1    Subjective Data  Subjective: Ive been walking alot in my room   Cognition  Overall Cognitive Status: Appears within functional limits for tasks assessed/performed Arousal/Alertness: Awake/alert Orientation Level: Appears intact for tasks assessed Behavior During Session: [facility] for tasks performed    Balance     End of Session PT - End of Session Equipment Utilized During Treatment: Cervical collar Activity Tolerance: Patient tolerated treatment well Patient left: in chair;with family/visitor present Nurse Communication: Mobility status    Jacqualyn Posey 05/16/2012, 9:24 AM 05/16/2012 Jacqualyn Posey PTA [phone] pager [phone] office

## 2012-05-16 NOTE — Progress Notes (Signed)
Agree with PTA discharge note.  [place], [place] DPT [phone]

## 2012-05-17 NOTE — Discharge Summary (Signed)
Physician Discharge Summary  [name] MRN: [id number] DOB/AGE: Apr 18, 1936 76 y.o.  Admit date: 05/14/2012 Discharge date: 05/17/2012  Admission Diagnoses:cervical stenosis  Discharge Diagnoses: same  Discharged Condition: no pain.no weakness  Hospital Course:surgery  Consults: none  Significant Diagnostic Studies: myelogram}  Treatments:cervical fusion  Discharge Exam: Blood pressure 159/80, pulse 96, temperature 97.8 F (36.6 C), temperature source Oral, resp. rate 18, height 5\' 7"  (1.702 m), weight 100.245 kg (221 lb), SpO2 98.00%. No pain  Disposition: home on percocet and diazepam   Medication List  As of 05/17/2012 12:21 PM   ASK your doctor about these medications         allopurinol 100 MG tablet   Commonly known as: ZYLOPRIM   Take 100 mg by mouth daily.      amLODipine 5 MG tablet   Commonly known as: NORVASC   Take 5 mg by mouth daily.      aspirin EC 81 MG tablet   Take 81 mg by mouth daily.      cholecalciferol 1000 UNITS tablet   Commonly known as: VITAMIN D   Take 1,000 Units by mouth daily.      fenofibrate 160 MG tablet   Take 160 mg by mouth daily.      Fish Oil 1000 MG Caps   Take 1,000 mg by mouth 2 (two) times daily.      levothyroxine 50 MCG tablet   Commonly known as: SYNTHROID, LEVOTHROID   Take 50 mcg by mouth daily.      olmesartan-hydrochlorothiazide 40-25 MG per tablet   Commonly known as: BENICAR HCT   Take 1 tablet by mouth daily.      pravastatin 80 MG tablet   Commonly known as: PRAVACHOL   Take 80 mg by mouth daily.      predniSONE 20 MG tablet   Commonly known as: DELTASONE   Take 40 mg by mouth daily.      vitamin C 500 MG tablet   Commonly known as: ASCORBIC ACID   Take 500 mg by mouth daily.             Signed: Floyce Stakes 05/17/2012, 12:21 PM

## 2012-05-17 NOTE — Care Management Note (Signed)
    Page 1 of 1   05/17/2012     2:20:04 PM   CARE MANAGEMENT NOTE 05/17/2012  Patient:  Keith Wiley, Keith Wiley   Account Number:  [id number]  Date Initiated:  05/17/2012  Documentation initiated by:  Lars Pinks  Subjective/Objective Assessment:   PT WAS ADMITTED FOR SURGERY     Action/Plan:   PROGRESSION OF CARE AND DISCHARGE PLANNING   Anticipated DC Date:  05/17/2012   Anticipated DC Plan:  [place]  CM consult      Choice offered to / List presented to:  [name]   DME arranged  [place]      DME agency  [place]        Status of service:  Completed, signed off Medicare Important Message given?   (If response is "NO", the following Medicare IM given date fields will be blank) Date Medicare IM given:   Date Additional Medicare IM given:    Discharge Disposition:  HOME/SELF CARE  Per UR Regulation:  Reviewed for med. necessity/level of care/duration of stay  If discussed at [place] of Stay Meetings, dates discussed:    Comments:  05/17/12 Lars Pinks, RN, BSN [place]

## 2012-06-29 ENCOUNTER — Encounter (HOSPITAL_COMMUNITY): Payer: Self-pay | Admitting: Emergency Medicine

## 2012-06-29 ENCOUNTER — Emergency Department (HOSPITAL_COMMUNITY): Payer: Medicare Other

## 2012-06-29 ENCOUNTER — Emergency Department (HOSPITAL_COMMUNITY)
Admission: EM | Admit: 2012-06-29 | Discharge: 2012-06-29 | Disposition: A | Discharge status: Home / Self Care | Payer: Medicare Other | Attending: Emergency Medicine | Admitting: Emergency Medicine

## 2012-06-29 DIAGNOSIS — M542 Cervicalgia: Secondary | ICD-10-CM

## 2012-06-29 DIAGNOSIS — Z87891 Personal history of nicotine dependence: Secondary | ICD-10-CM | POA: Insufficient documentation

## 2012-06-29 DIAGNOSIS — Z981 Arthrodesis status: Secondary | ICD-10-CM | POA: Insufficient documentation

## 2012-06-29 DIAGNOSIS — M109 Gout, unspecified: Secondary | ICD-10-CM | POA: Insufficient documentation

## 2012-06-29 DIAGNOSIS — Z8546 Personal history of malignant neoplasm of prostate: Secondary | ICD-10-CM | POA: Insufficient documentation

## 2012-06-29 DIAGNOSIS — I10 Essential (primary) hypertension: Secondary | ICD-10-CM | POA: Insufficient documentation

## 2012-06-29 DIAGNOSIS — E039 Hypothyroidism, unspecified: Secondary | ICD-10-CM | POA: Insufficient documentation

## 2012-06-29 LAB — POCT I-STAT, CHEM 8
Creatinine, Ser: 2.3 mg/dL — ABNORMAL HIGH (ref 0.50–1.35)
Glucose, Bld: 114 mg/dL — ABNORMAL HIGH (ref 70–99)
Hemoglobin: 13.9 g/dL (ref 13.0–17.0)
Sodium: 140 mEq/L (ref 135–145)
TCO2: 22 mmol/L (ref 0–100)

## 2012-06-29 MED ORDER — DIAZEPAM 5 MG PO TABS: 5.0000 mg | ORAL_TABLET | Freq: Two times a day (BID) | ORAL | Status: AC

## 2012-06-29 MED ORDER — METHYLPREDNISOLONE 4 MG PO KIT: PACK | ORAL | Status: AC

## 2012-06-29 MED ORDER — MORPHINE SULFATE 4 MG/ML IJ SOLN
4.0000 mg | Freq: Once | INTRAMUSCULAR | Status: AC
  Administered 2012-06-29: 4 mg via INTRAMUSCULAR
  Filled 2012-06-29: qty 1

## 2012-06-29 NOTE — ED Provider Notes (Signed)
History     CSN: [id number]  Arrival date & time 06/29/12  [id number]   First MD Initiated Contact with Patient 06/29/12 1008      Chief Complaint  Patient presents with  . Neck Pain  . Back Pain    (Consider location/radiation/quality/duration/timing/severity/associated sxs/prior treatment) HPI  H/o cervical radiculopathy s/p anterior 4-5 diskectomy with fusion (see EPIC for full details) presents with neck pain. The patient states that he had no pain postoperatively. He states that 6 days ago he did step off a curb and it "jarred my neck". He states that he had minimal pain at that time which resolved that day. 3 days later he began physical neck pain again. He states now the pain is shooting from his neck in the middle, right side, down his back. He is having minimal pain in both arms. He's having numbness in his left upper extremity. He denies weakness. He states that the numbness is new from previous. Denies fevers, chills. He denies anterior neck pain. There is no drainage from the surgical wound. Has been taking percocet with min relief.  OPERATIVE REPORT PER EPIC/DR. BOTERO:  DATE OF PROCEDURE: 05/14/2012  DATE OF DISCHARGE:  OPERATIVE REPORT  PREOPERATIVE DIAGNOSIS: Degenerative disk disease, C4 to thoracic 1  with chronic radiculopathy, left worse than the right side, status post  cervical laminectomy.  PREOPERATIVE DIAGNOSIS: Degenerative disk disease C4 to thoracic 1 with  chronic radiculopathy, left worse than the right side, status post  cervical laminectomy.  PROCEDURE: Anterior 4-5 diskectomy with fusion with cage 9-mm height,  C7 through thoracic 1 diskectomy, decompression of spinal cord,  foraminotomy, fusion with cage of 9 mm. C6 corpectomy, decompression of  spinal cord, foraminotomy to decompress the C6 and C7 nerve root  bilaterally and interbody fusion with allograft. Plate from C4 to  thoracic 1. Microscope.  Past Medical History  Diagnosis Date  .  Hypothyroidism   . Gout   . Cancer     prostrate  . Hypertension     dr Redge Gainer  pcp    Past Surgical History  Procedure Date  . Carpal tunnel release   . Back surgery     cervical   1989  . Prostrate     seeds inplant   2009  . Hernia repair     Family History  Problem Relation Age of Onset  . Anesthesia problems Neg Hx     History  Substance Use Topics  . Smoking status: Former Smoker -- 1.0 packs/day for 20 years    Types: Cigarettes    Quit date: 04/01/1977  . Smokeless tobacco: Never Used  . Alcohol Use: No      Review of Systems  All other systems reviewed and are negative.   except as noted HPI   Allergies  Review of patient's allergies indicates no known allergies.  Home Medications   Current Outpatient Rx  Name Route Sig Dispense Refill  . ALLOPURINOL 100 MG PO TABS Oral Take 100 mg by mouth daily.    Marland Kitchen AMLODIPINE BESYLATE 5 MG PO TABS Oral Take 5 mg by mouth daily.    . ASPIRIN EC 81 MG PO TBEC Oral Take 81 mg by mouth daily.    Marland Kitchen VITAMIN D 1000 UNITS PO TABS Oral Take 1,000 Units by mouth daily.    . FENOFIBRATE 160 MG PO TABS Oral Take 160 mg by mouth daily.    . OMEGA-3 FATTY ACIDS 1000 MG PO CAPS Oral Take 1  g by mouth 2 (two) times daily.    Marland Kitchen LEVOTHYROXINE SODIUM 50 MCG PO TABS Oral Take 50 mcg by mouth daily.    Marland Kitchen OLMESARTAN MEDOXOMIL-HCTZ 40-25 MG PO TABS Oral Take 1 tablet by mouth daily.    . OXYCODONE-ACETAMINOPHEN 5-325 MG PO TABS Oral Take 1 tablet by mouth every 6 (six) hours as needed. pain    . PRAVASTATIN SODIUM 80 MG PO TABS Oral Take 80 mg by mouth daily.    Marland Kitchen VITAMIN C 500 MG PO TABS Oral Take 500 mg by mouth daily.    Marland Kitchen DIAZEPAM 5 MG PO TABS Oral Take 1 tablet (5 mg total) by mouth 2 (two) times daily. 10 tablet 0    BP 111/72  Pulse 100  Temp 98 F (36.7 C) (Oral)  Resp 18  Ht 5\' 8"  (1.727 m)  Wt 210 lb (95.255 kg)  BMI 31.93 kg/m2  SpO2 100%  Physical Exam  Nursing note and vitals reviewed. Constitutional:  He is oriented to person, place, and time. He appears well-developed and well-nourished. No distress.  HENT:  Head: Atraumatic.  Mouth/Throat: Oropharynx is clear and moist.  Eyes: Conjunctivae are normal. Pupils are equal, round, and reactive to light.  Neck:       c collar in place with thoracic brace Minimal diffuse tenderness to palpation midline, right, left neck. Incision c/d/i   Cardiovascular: Normal rate, regular rhythm, normal heart sounds and intact distal pulses.  Exam reveals no gallop and no friction rub.   No murmur heard. Pulmonary/Chest: Effort normal. No respiratory distress. He has no wheezes. He has no rales. He exhibits tenderness.  Abdominal: Soft. Bowel sounds are normal. There is no tenderness. There is no rebound and no guarding.  Musculoskeletal: Normal range of motion. He exhibits no edema and no tenderness.       Strength 5/5 b/l UE except Lt triceps 4+/5  Neurological: He is alert and oriented to person, place, and time.  Skin: Skin is warm and dry.  Psychiatric: He has a normal mood and affect.    ED Course  Procedures (including critical care time)  Labs Reviewed  POCT I-STAT, CHEM 8 - Abnormal; Notable for the following:    BUN 32 (*)     Creatinine, Ser 2.30 (*)     Glucose, Bld 114 (*)     All other components within normal limits   Dg Cervical Spine Complete  06/29/2012  *RADIOLOGY REPORT*  Clinical Data: Neck pain and bilateral shoulder pain.  Numbness and weakness in the left arm.  Neck surgery 1 month ago.  CERVICAL SPINE - COMPLETE 4+ VIEW  Comparison: Previous examinations, the most recent dated 06/10/2012 at [facility].  Findings: Again demonstrated is anterior screw and plate fixation extending from the C4 level to the upper T1 level with interbody bone plugs at the C4-5 and C7-T1 levels.  Mild acute kyphosis at the C5-6 level is unchanged.  The plate remains anteriorly positioned relative to the C5 and C6 vertebral bodies without screws  at those levels.  A collar remains in place.  IMPRESSION: Stable postoperative changes, as described above.  Original Report Authenticated By: Gerald Stabs, M.D.    1. Neck pain     MDM  Neck pain after surgery. XR without change from previous, no apparent hardware malfunction. Discussed with neurosurgery on call Dr. Annette Stable who recommends medrol dose pak, pain control, and f/u with Dr. Joya Salm. Outpatient MRI. No EMC precluding discharge at this time.  Given Precautions for return. PMD f/u.         Blair Heys, MD 06/29/12 1213

## 2012-06-29 NOTE — ED Notes (Signed)
Pt c/o neck and back pain since Wednesday. Pt reports he had surgery 6 weeks ago and hasn't had much pain at all until Wednesday. Pt reports the pain radiates down back of arms and shoulder blades. Pt denies chest pain and sob.

## 2012-07-03 ENCOUNTER — Inpatient Hospital Stay (HOSPITAL_COMMUNITY): Admission: RE | Admit: 2012-07-03 | Payer: Medicare Other | Source: Ambulatory Visit

## 2012-07-23 ENCOUNTER — Ambulatory Visit: Payer: Medicare Other | Attending: Neurosurgery | Admitting: Physical Therapy

## 2012-07-23 DIAGNOSIS — M6281 Muscle weakness (generalized): Secondary | ICD-10-CM | POA: Insufficient documentation

## 2012-07-23 DIAGNOSIS — R5381 Other malaise: Secondary | ICD-10-CM | POA: Insufficient documentation

## 2012-07-23 DIAGNOSIS — M542 Cervicalgia: Secondary | ICD-10-CM | POA: Insufficient documentation

## 2012-07-23 DIAGNOSIS — IMO0001 Reserved for inherently not codable concepts without codable children: Secondary | ICD-10-CM | POA: Insufficient documentation

## 2012-08-01 ENCOUNTER — Ambulatory Visit: Payer: Medicare Other | Attending: Neurosurgery | Admitting: *Deleted

## 2012-08-01 DIAGNOSIS — M6281 Muscle weakness (generalized): Secondary | ICD-10-CM | POA: Insufficient documentation

## 2012-08-01 DIAGNOSIS — IMO0001 Reserved for inherently not codable concepts without codable children: Secondary | ICD-10-CM | POA: Insufficient documentation

## 2012-08-01 DIAGNOSIS — M542 Cervicalgia: Secondary | ICD-10-CM | POA: Insufficient documentation

## 2012-08-01 DIAGNOSIS — R5381 Other malaise: Secondary | ICD-10-CM | POA: Insufficient documentation

## 2012-08-07 ENCOUNTER — Ambulatory Visit: Payer: Medicare Other | Admitting: Physical Therapy

## 2013-03-21 ENCOUNTER — Other Ambulatory Visit: Payer: Self-pay | Admitting: Family Medicine

## 2013-04-21 ENCOUNTER — Other Ambulatory Visit: Payer: Self-pay | Admitting: Family Medicine

## 2013-04-22 NOTE — Telephone Encounter (Signed)
Mail order scripts, call pt. At [phone] for pick up

## 2013-04-23 ENCOUNTER — Other Ambulatory Visit: Payer: Self-pay | Admitting: *Deleted

## 2013-04-23 MED ORDER — OLMESARTAN MEDOXOMIL-HCTZ 40-25 MG PO TABS: 1.0000 | ORAL_TABLET | Freq: Every day | ORAL | Status: DC

## 2013-04-23 NOTE — Progress Notes (Signed)
Pt called for mail order RX for Benicar/HCT RX to front for pt pickup

## 2013-04-23 NOTE — Telephone Encounter (Signed)
RX's to front for pick up Pt's wife notified

## 2013-05-28 ENCOUNTER — Ambulatory Visit (INDEPENDENT_AMBULATORY_CARE_PROVIDER_SITE_OTHER): Payer: Medicare Other | Admitting: Family Medicine

## 2013-05-28 ENCOUNTER — Encounter: Payer: Self-pay | Admitting: Family Medicine

## 2013-05-28 VITALS — BP 144/83 | HR 87 | Temp 97.4°F | Ht 67.5 in | Wt 206.0 lb

## 2013-05-28 DIAGNOSIS — E559 Vitamin D deficiency, unspecified: Secondary | ICD-10-CM | POA: Insufficient documentation

## 2013-05-28 DIAGNOSIS — Z8546 Personal history of malignant neoplasm of prostate: Secondary | ICD-10-CM

## 2013-05-28 DIAGNOSIS — M109 Gout, unspecified: Secondary | ICD-10-CM

## 2013-05-28 DIAGNOSIS — E785 Hyperlipidemia, unspecified: Secondary | ICD-10-CM

## 2013-05-28 DIAGNOSIS — I10 Essential (primary) hypertension: Secondary | ICD-10-CM | POA: Insufficient documentation

## 2013-05-28 DIAGNOSIS — N184 Chronic kidney disease, stage 4 (severe): Secondary | ICD-10-CM

## 2013-05-28 DIAGNOSIS — J309 Allergic rhinitis, unspecified: Secondary | ICD-10-CM

## 2013-05-28 LAB — HEPATIC FUNCTION PANEL
AST: 28 U/L (ref 0–37)
Albumin: 4.8 g/dL (ref 3.5–5.2)
Alkaline Phosphatase: 48 U/L (ref 39–117)
Total Protein: 7.2 g/dL (ref 6.0–8.3)

## 2013-05-28 LAB — POCT CBC
Granulocyte percent: 69.8 % (ref 37–80)
HCT, POC: 43.8 % (ref 43.5–53.7)
Hemoglobin: 15.1 g/dL (ref 14.1–18.1)
Lymph, poc: 2 (ref 0.6–3.4)
MCH, POC: 29.8 pg (ref 27–31.2)
MCHC: 34.4 g/dL (ref 31.8–35.4)
MCV: 86.5 fL (ref 80–97)
MPV: 7.3 fL (ref 0–99.8)
POC Granulocyte: 6.1 (ref 2–6.9)
POC LYMPH PERCENT: 22.4 % (ref 10–50)
Platelet Count, POC: 318 K/uL (ref 142–424)
RBC: 5.1 M/uL (ref 4.69–6.13)
RDW, POC: 13.5 %
WBC: 8.8 K/uL (ref 4.6–10.2)

## 2013-05-28 LAB — BASIC METABOLIC PANEL WITHOUT GFR
BUN: 28 mg/dL — ABNORMAL HIGH (ref 6–23)
CO2: 22 meq/L (ref 19–32)
Calcium: 9.6 mg/dL (ref 8.4–10.5)
Chloride: 108 meq/L (ref 96–112)
Creat: 2.21 mg/dL — ABNORMAL HIGH (ref 0.50–1.35)
GFR, Est African American: 32 mL/min — ABNORMAL LOW
GFR, Est Non African American: 28 mL/min — ABNORMAL LOW
Glucose, Bld: 104 mg/dL — ABNORMAL HIGH (ref 70–99)
Potassium: 4.8 meq/L (ref 3.5–5.3)
Sodium: 141 meq/L (ref 135–145)

## 2013-05-28 NOTE — Addendum Note (Signed)
Addended by: Selmer Dominion on: 05/28/2013 09:05 AM   Modules accepted: Orders

## 2013-05-28 NOTE — Progress Notes (Signed)
  Subjective:    [name], male    DOB: Jan 04, 1936, 77 y.o.   MRN: [id number]  HPI This patient presents for recheck of multiple medical problems. No one accompanies the patient today.  Patient Active Problem List   Diagnosis Date Noted  . Hypertension 05/28/2013  . Hyperlipidemia 05/28/2013  . Vitamin D deficiency 05/28/2013  . Chronic kidney disease (CKD), stage IV (severe) 05/28/2013  . History of prostate cancer 05/28/2013  . Gout 05/28/2013    In addition, he has complaints with seasonal allergic rhinitis.  The allergies, current medications, past medical history, surgical history, family and social history are reviewed.  Immunizations reviewed.  Health maintenance reviewed.  The following items are outstanding: None      Review of Systems  Constitutional: Negative.   HENT: Positive for postnasal drip (slight).   Eyes: Negative.   Respiratory: Negative.   Cardiovascular: Negative.   Gastrointestinal: Negative.   Endocrine: Negative.   Genitourinary: Negative.   Musculoskeletal: Positive for back pain (LBP).  Skin: Negative.   Allergic/Immunologic: Positive for environmental allergies.  Neurological: Negative.   Psychiatric/Behavioral: Negative.    See outside blood pressures    Objective:   Physical Exam BP 144/83  Pulse 87  Temp(Src) 97.4 F (36.3 C) (Oral)  Ht 5' 7.5" (1.715 m)  Wt 206 lb (93.441 kg)  BMI 31.77 kg/m2  The patient appeared well nourished and normally developed, alert and oriented to time and place. Speech, behavior and judgement appear normal. Vital signs as documented.  Head exam is unremarkable. No scleral icterus or pallor noted. He wears dentures. Mouth and throat were normal. TMs were normal. Neck is without jugular venous distension, thyromegally, or carotid bruits. Carotid upstrokes are brisk bilaterally. No cervical adenopathy. Lungs are clear anteriorly and posteriorly to auscultation. Normal respiratory  effort. Minimal expiratory wheezes at the bases. Cardiac exam reveals regular rate and rhythm at 84 per minute. First and second heart sounds normal.  No murmurs, rubs or gallops.  Abdominal exam reveals normal bowl sounds, no masses, no organomegaly and no aortic enlargement. No inguinal adenopathy. No tenderness. Extremities are nonedematous and both femoral and pedal pulses are normal. Skin without pallor or jaundice.  Warm and dry, without rash. Neurologic exam reveals normal deep tendon reflexes and normal sensation.  Chest x-ray from one year ago was within normal limits.        Assessment & Plan:  1. Hypertension - POCT CBC; Standing - BASIC METABOLIC PANEL WITH GFR; Standing  2. Hyperlipidemia - Hepatic function panel; Standing - NMR Lipoprofile with Lipids; Standing  3. Vitamin D deficiency - Vitamin D 25 hydroxy; Standing  4. Chronic kidney disease (CKD), stage IV (severe) Patient sees Dr. Justin Mend periodically Reminded to avoid NSAID  5. History of prostate cancer Patient sees Dr. Jeffie Pollock yearly  6. Gout Stable  7. Mild allergic rhinitis No treatment necessary at this time Patient Instructions  Fall precautions discussed Continue current meds and therapeutic lifestyle changes   If wheezing continues or patient notices any increased shortness of breath we will get a repeat chest x-ray.

## 2013-05-28 NOTE — Patient Instructions (Addendum)
Fall precautions discussed Continue current meds and therapeutic lifestyle changes

## 2013-05-29 LAB — NMR LIPOPROFILE WITH LIPIDS
HDL Particle Number: 35.4 umol/L (ref >=30.5)
HDL-C: 39 mg/dL — ABNORMAL LOW (ref >=40)
LDL Size: 20.5 nm — ABNORMAL LOW (ref >20.5)
Large HDL-P: 1.5 umol/L — ABNORMAL LOW (ref >=4.8)
Small LDL Particle Number: 505 nmol/L (ref <=527)

## 2013-06-18 ENCOUNTER — Other Ambulatory Visit: Payer: Self-pay | Admitting: Gastroenterology

## 2013-07-29 ENCOUNTER — Other Ambulatory Visit: Payer: Self-pay | Admitting: Family Medicine

## 2013-07-30 ENCOUNTER — Other Ambulatory Visit: Payer: Self-pay | Admitting: Family Medicine

## 2013-09-05 ENCOUNTER — Other Ambulatory Visit (INDEPENDENT_AMBULATORY_CARE_PROVIDER_SITE_OTHER): Payer: Medicare Other

## 2013-09-05 DIAGNOSIS — E785 Hyperlipidemia, unspecified: Secondary | ICD-10-CM

## 2013-09-05 DIAGNOSIS — I10 Essential (primary) hypertension: Secondary | ICD-10-CM

## 2013-09-05 DIAGNOSIS — E559 Vitamin D deficiency, unspecified: Secondary | ICD-10-CM

## 2013-09-05 LAB — BASIC METABOLIC PANEL WITH GFR
CO2: 24 mEq/L (ref 19–32)
Calcium: 9.6 mg/dL (ref 8.4–10.5)
GFR, Est African American: 36 mL/min — ABNORMAL LOW
Sodium: 138 mEq/L (ref 135–145)

## 2013-09-09 NOTE — Progress Notes (Signed)
Patient came in for labs only.

## 2013-09-19 ENCOUNTER — Other Ambulatory Visit: Payer: Self-pay | Admitting: Family Medicine

## 2013-10-13 ENCOUNTER — Ambulatory Visit (INDEPENDENT_AMBULATORY_CARE_PROVIDER_SITE_OTHER): Payer: Medicare Other | Admitting: Family Medicine

## 2013-10-13 ENCOUNTER — Encounter: Payer: Self-pay | Admitting: Family Medicine

## 2013-10-13 VITALS — BP 161/88 | HR 87 | Temp 97.0°F | Ht 67.5 in | Wt 202.0 lb

## 2013-10-13 DIAGNOSIS — Z23 Encounter for immunization: Secondary | ICD-10-CM

## 2013-10-13 DIAGNOSIS — N184 Chronic kidney disease, stage 4 (severe): Secondary | ICD-10-CM

## 2013-10-13 DIAGNOSIS — I10 Essential (primary) hypertension: Secondary | ICD-10-CM

## 2013-10-13 DIAGNOSIS — E559 Vitamin D deficiency, unspecified: Secondary | ICD-10-CM

## 2013-10-13 DIAGNOSIS — E785 Hyperlipidemia, unspecified: Secondary | ICD-10-CM

## 2013-10-13 LAB — POCT CBC
HCT, POC: 45.6 % (ref 43.5–53.7)
Hemoglobin: 14.9 g/dL (ref 14.1–18.1)
Lymph, poc: 2.6 (ref 0.6–3.4)
MCH, POC: 28.3 pg (ref 27–31.2)
MCHC: 32.8 g/dL (ref 31.8–35.4)
MCV: 86.3 fL (ref 80–97)
RBC: 5.3 M/uL (ref 4.69–6.13)
WBC: 7.9 10*3/uL (ref 4.6–10.2)

## 2013-10-13 MED ORDER — AMLODIPINE BESYLATE 5 MG PO TABS: ORAL_TABLET | ORAL | Status: DC

## 2013-10-13 MED ORDER — CLOTRIMAZOLE-BETAMETHASONE 1-0.05 % EX CREA: TOPICAL_CREAM | Freq: Two times a day (BID) | CUTANEOUS | Status: DC

## 2013-10-13 MED ORDER — LEVOTHYROXINE SODIUM 50 MCG PO TABS: ORAL_TABLET | ORAL | Status: DC

## 2013-10-13 MED ORDER — OLMESARTAN MEDOXOMIL-HCTZ 40-25 MG PO TABS: 1.0000 | ORAL_TABLET | Freq: Every day | ORAL | Status: DC

## 2013-10-13 MED ORDER — ALLOPURINOL 100 MG PO TABS: ORAL_TABLET | ORAL | Status: DC

## 2013-10-13 NOTE — Progress Notes (Signed)
Subjective:    [name], male    DOB: 1936-09-23, 77 y.o.   MRN: [id number]  HPI Pt here for follow up and management of chronic medical problems. Patient has been doing well. His biggest complaints continue to be his back pain and neuropathy from the back pain. He also has a history of chronic kidney disease and recently saw the nephrologist who rescheduled him for one year out. The patient has seen Dr. Joya Salm regarding his neck pain and indicates that if he has to go see someone regarding his back and his neuropathy that he would like to see him again. I did indicate that he needed an MRI before he sees Dr. Joya Salm. He'll get a Prevnar and flu shot today. He'll also get labs drawn today. His most recent creatinine with [place] in March was 2.26. He only takes Tylenol for pain and that is that he should not take any anti-inflammatory agents.     Patient Active Problem List   Diagnosis Date Noted  . Hypertension 05/28/2013  . Hyperlipidemia 05/28/2013  . Vitamin D deficiency 05/28/2013  . Chronic kidney disease (CKD), stage IV (severe) 05/28/2013  . History of prostate cancer 05/28/2013  . Gout 05/28/2013   Outpatient Encounter Prescriptions as of 10/13/2013  Medication Sig Dispense Refill  . allopurinol (ZYLOPRIM) 100 MG tablet Take 1 tablet by mouth  daily  90 tablet  3  . amLODipine (NORVASC) 5 MG tablet Take 1 and 1/2 tablets by  mouth every day  135 tablet  0  . aspirin EC 81 MG tablet Take 81 mg by mouth daily.      . cholecalciferol (VITAMIN D) 1000 UNITS tablet Take 1,000 Units by mouth daily.      . fenofibrate 160 MG tablet Take 160 mg by mouth daily.      . fish oil-omega-3 fatty acids 1000 MG capsule Take 1 g by mouth 2 (two) times daily.      Marland Kitchen levothyroxine (SYNTHROID, LEVOTHROID) 50 MCG tablet Take 1 tablet by mouth  daily  90 tablet  0  . olmesartan-hydrochlorothiazide (BENICAR HCT) 40-25 MG per tablet Take 1 tablet by mouth daily.  90 tablet  3  .  pravastatin (PRAVACHOL) 80 MG tablet TAKE ONE TABLET BY MOUTH ONCE DAILY  90 tablet  1  . vitamin C (ASCORBIC ACID) 500 MG tablet Take 500 mg by mouth daily.      . [DISCONTINUED] pravastatin (PRAVACHOL) 80 MG tablet TAKE ONE TABLET BY MOUTH EVERY DAY  90 tablet  1   No facility-administered encounter medications on file as of 10/13/2013.    Review of Systems  Constitutional: Negative.   HENT: Negative.   Eyes: Negative.   Respiratory: Negative.   Cardiovascular: Negative.   Gastrointestinal: Negative.   Endocrine: Negative.   Genitourinary: Negative.   Musculoskeletal: Positive for back pain.  Skin: Negative.   Allergic/Immunologic: Negative.   Neurological: Negative.   Hematological: Negative.   Psychiatric/Behavioral: Negative.        Objective:   Physical Exam  Nursing note and vitals reviewed. Constitutional: He is oriented to person, place, and time. He appears well-developed and well-nourished. No distress.  HENT:  Head: Normocephalic and atraumatic.  Right Ear: External ear normal.  Left Ear: External ear normal.  Nose: Nose normal.  Mouth/Throat: Oropharynx is clear and moist. No oropharyngeal exudate.  Eyes: Conjunctivae and EOM are normal. Pupils are equal, round, and reactive to light. Right eye exhibits no discharge. Left eye  exhibits no discharge. No scleral icterus.  Neck: Normal range of motion. Neck supple. No tracheal deviation present. No thyromegaly present.  No bruits in the neck  Cardiovascular: Normal rate, regular rhythm, normal heart sounds and intact distal pulses.  Exam reveals no gallop and no friction rub.   No murmur heard. At 84 per minute  Pulmonary/Chest: Effort normal and breath sounds normal. No respiratory distress. He has no wheezes. He has no rales.  Abdominal: Soft. Bowel sounds are normal. He exhibits no mass. There is no tenderness. There is no rebound and no guarding.  Genitourinary:  This exam is done by Dr. Jeffie Pollock   Musculoskeletal: Normal range of motion. He exhibits no edema and no tenderness.  He does have bilateral bunions. Sensation was intact on both feet.  Lymphadenopathy:    He has no cervical adenopathy.  Neurological: He is alert and oriented to person, place, and time. He has normal reflexes. No cranial nerve deficit.  Skin: Skin is warm and dry. No rash noted. No erythema. No pallor.  Dry skin on both dorsal feet with nail fungus   Psychiatric: He has a normal mood and affect. His behavior is normal. Judgment and thought content normal.   BP 161/88  Pulse 87  Temp(Src) 97 F (36.1 C) (Oral)  Ht 5' 7.5" (1.715 m)  Wt 202 lb (91.627 kg)  BMI 31.15 kg/m2  His repeat blood pressure was 120/84 right arm sitting      Assessment & Plan:   1. Hypertension   2. Hyperlipidemia   3. Vitamin D deficiency   4. Chronic kidney disease (CKD), stage IV (severe)    Orders Placed This Encounter  Procedures  . Hepatic function panel  . BMP8+EGFR  . NMR, lipoprofile  . Vit D  25 hydroxy (rtn osteoporosis monitoring)  . POCT CBC   Meds ordered this encounter  Medications  . clotrimazole-betamethasone (LOTRISONE) cream    Sig: Apply topically 2 (two) times daily.    Dispense:  15 g    Refill:  3  . allopurinol (ZYLOPRIM) 100 MG tablet    Sig: Take 1 tablet by mouth  daily    Dispense:  90 tablet    Refill:  3  . amLODipine (NORVASC) 5 MG tablet    Sig: Take 1 and 1/2 tablets by  mouth every day    Dispense:  135 tablet    Refill:  3  . levothyroxine (SYNTHROID, LEVOTHROID) 50 MCG tablet    Sig: Take 1 tablet by mouth  daily    Dispense:  90 tablet    Refill:  3    Need to be seen before next refill  . olmesartan-hydrochlorothiazide (BENICAR HCT) 40-25 MG per tablet    Sig: Take 1 tablet by mouth daily.    Dispense:  90 tablet    Refill:  3   Patient Instructions  Continue current medications. Continue good therapeutic lifestyle changes.  Fall precautions discussed with  patient. Follow up as planned and earlier as needed.  Continue to use moisturizers on both feet Try to walk and exercise as much as possible If burning and stinging and feet or pain get worse or in the legs, please call us and we will arrange to get an MRI    Arrie Senate MD

## 2013-10-13 NOTE — Addendum Note (Signed)
Addended by: Priscille Heidelberg on: 10/13/2013 03:41 PM   Modules accepted: Orders

## 2013-10-13 NOTE — Patient Instructions (Addendum)
Continue current medications. Continue good therapeutic lifestyle changes.  Fall precautions discussed with patient. Follow up as planned and earlier as needed.  Continue to use moisturizers on both feet Try to walk and exercise as much as possible If burning and stinging and feet or pain get worse or in the legs, please call us and we will arrange to get an MRI

## 2013-10-15 LAB — BMP8+EGFR
CO2: 20 mmol/L (ref 18–29)
Calcium: 10.1 mg/dL (ref 8.6–10.2)
Chloride: 103 mmol/L (ref 97–108)
Creatinine, Ser: 2.18 mg/dL — ABNORMAL HIGH (ref 0.76–1.27)
Glucose: 94 mg/dL (ref 65–99)
Potassium: 5.3 mmol/L — ABNORMAL HIGH (ref 3.5–5.2)
Sodium: 143 mmol/L (ref 134–144)

## 2013-10-15 LAB — NMR, LIPOPROFILE
Cholesterol: 136 mg/dL (ref <200)
LDL Particle Number: 1112 nmol/L — ABNORMAL HIGH (ref <1000)
LDL Size: 21 nm (ref >20.5)
LP-IR Score: 57 — ABNORMAL HIGH (ref <=45)
Small LDL Particle Number: 730 nmol/L — ABNORMAL HIGH (ref <=527)
Triglycerides by NMR: 71 mg/dL (ref <150)

## 2013-10-15 LAB — HEPATIC FUNCTION PANEL
ALT: 18 IU/L (ref 0–44)
AST: 25 IU/L (ref 0–40)
Alkaline Phosphatase: 56 IU/L (ref 39–117)
Bilirubin, Direct: 0.15 mg/dL (ref 0.00–0.40)
Total Bilirubin: 0.3 mg/dL (ref 0.0–1.2)

## 2013-10-23 ENCOUNTER — Telehealth: Payer: Self-pay | Admitting: Family Medicine

## 2013-10-27 NOTE — Telephone Encounter (Signed)
benicar cost is $125 /mo Could we try losartan hct - per insurance will cover for only $8. Will need printed for mail order.

## 2013-10-27 NOTE — Telephone Encounter (Signed)
Change to losartan H CT 100/25, if he is taking Benicar 40-25 one daily, then he would take Losartan 100/25 one daily This is okay for 3 months with one refill

## 2013-10-28 MED ORDER — LOSARTAN POTASSIUM-HCTZ 100-25 MG PO TABS: 1.0000 | ORAL_TABLET | Freq: Every day | ORAL | Status: DC

## 2013-11-17 ENCOUNTER — Ambulatory Visit: Payer: Medicare Other | Admitting: Family Medicine

## 2013-11-22 ENCOUNTER — Other Ambulatory Visit: Payer: Self-pay | Admitting: Family Medicine

## 2013-12-26 ENCOUNTER — Other Ambulatory Visit: Payer: Self-pay | Admitting: Family Medicine

## 2014-02-12 ENCOUNTER — Encounter: Payer: Self-pay | Admitting: Family Medicine

## 2014-02-12 ENCOUNTER — Ambulatory Visit (INDEPENDENT_AMBULATORY_CARE_PROVIDER_SITE_OTHER): Payer: Medicare Other | Admitting: Family Medicine

## 2014-02-12 VITALS — BP 161/84 | HR 92 | Temp 98.1°F | Ht 67.5 in | Wt 206.0 lb

## 2014-02-12 DIAGNOSIS — Z8546 Personal history of malignant neoplasm of prostate: Secondary | ICD-10-CM

## 2014-02-12 DIAGNOSIS — N184 Chronic kidney disease, stage 4 (severe): Secondary | ICD-10-CM

## 2014-02-12 DIAGNOSIS — I10 Essential (primary) hypertension: Secondary | ICD-10-CM

## 2014-02-12 DIAGNOSIS — E8881 Metabolic syndrome: Secondary | ICD-10-CM | POA: Insufficient documentation

## 2014-02-12 DIAGNOSIS — E785 Hyperlipidemia, unspecified: Secondary | ICD-10-CM

## 2014-02-12 DIAGNOSIS — E559 Vitamin D deficiency, unspecified: Secondary | ICD-10-CM

## 2014-02-12 LAB — POCT CBC
Granulocyte percent: 62.3 %G (ref 37–80)
HEMATOCRIT: 43.5 % (ref 43.5–53.7)
HEMOGLOBIN: 13.9 g/dL — AB (ref 14.1–18.1)
LYMPH, POC: 2.2 (ref 0.6–3.4)
MCH: 27.7 pg (ref 27–31.2)
MCHC: 32 g/dL (ref 31.8–35.4)
MCV: 86.5 fL (ref 80–97)
MPV: 7.3 fL (ref 0–99.8)
POC GRANULOCYTE: 4.4 (ref 2–6.9)
POC LYMPH %: 32.1 % (ref 10–50)
Platelet Count, POC: 311 10*3/uL (ref 142–424)
RBC: 5 M/uL (ref 4.69–6.13)
RDW, POC: 14.1 %
WBC: 7 10*3/uL (ref 4.6–10.2)

## 2014-02-12 LAB — POCT GLYCOSYLATED HEMOGLOBIN (HGB A1C): Hemoglobin A1C: 5.4

## 2014-02-12 NOTE — Patient Instructions (Signed)
Keep appointments with the urologist and the nephrologist Be careful and don't put yourself at risk for falling Return the FOBT Continue current medications Drink plenty of fluids use saline nose spray frequently in the day Use a cool mist humidifier in her bedroom at nighttime

## 2014-02-12 NOTE — Progress Notes (Signed)
Subjective:    [name], male    DOB: 1936-02-28, 77 y.o.   MRN: [id number]  HPI Pt here for follow up and management of chronic medical problems. The patient will get a PSA as prostate exam done by Dr.Wrenn, the urologist next month. He will have lab work drawn today and will be given an FOBT return. His home blood pressures were reviewed and these were all good. He does see on a yearly basis the nephrologist Dr. Edrick Oh.       Patient Active Problem List   Diagnosis Date Noted  . Hypertension 05/28/2013  . Hyperlipidemia 05/28/2013  . Vitamin D deficiency 05/28/2013  . Chronic kidney disease (CKD), stage IV (severe) 05/28/2013  . History of prostate cancer 05/28/2013  . Gout 05/28/2013   Outpatient Encounter Prescriptions as of 02/12/2014  Medication Sig  . allopurinol (ZYLOPRIM) 100 MG tablet Take 1 tablet by mouth  daily  . amLODipine (NORVASC) 5 MG tablet Take 1 and 1/2 tablets by  mouth every day  . aspirin EC 81 MG tablet Take 81 mg by mouth daily.  . cholecalciferol (VITAMIN D) 1000 UNITS tablet Take 1,000 Units by mouth daily.  . clotrimazole-betamethasone (LOTRISONE) cream Apply topically 2 (two) times daily.  . fenofibrate 160 MG tablet TAKE ONE TABLET BY MOUTH EVERY DAY  . fish oil-omega-3 fatty acids 1000 MG capsule Take 1 g by mouth 2 (two) times daily.  Marland Kitchen levothyroxine (SYNTHROID, LEVOTHROID) 50 MCG tablet Take 1 tablet by mouth  daily  . losartan-hydrochlorothiazide (HYZAAR) 100-25 MG per tablet Take 1 tablet by mouth daily.  . pravastatin (PRAVACHOL) 80 MG tablet TAKE ONE TABLET BY MOUTH ONCE DAILY  . vitamin C (ASCORBIC ACID) 500 MG tablet Take 500 mg by mouth daily.  . [DISCONTINUED] olmesartan-hydrochlorothiazide (BENICAR HCT) 40-25 MG per tablet Take 1 tablet by mouth daily.    Review of Systems  Constitutional: Negative.   HENT: Negative.   Eyes: Negative.   Respiratory: Negative.   Cardiovascular: Negative.   Gastrointestinal:  Negative.   Endocrine: Negative.   Genitourinary: Negative.   Musculoskeletal: Negative.   Skin: Negative.   Allergic/Immunologic: Negative.   Neurological: Negative.   Hematological: Negative.   Psychiatric/Behavioral: Negative.        Objective:   Physical Exam  Nursing note and vitals reviewed. Constitutional: He is oriented to person, place, and time. He appears well-developed and well-nourished. No distress.  HENT:  Head: Normocephalic and atraumatic.  Right Ear: External ear normal.  Left Ear: External ear normal.  Mouth/Throat: Oropharynx is clear and moist. No oropharyngeal exudate.  Nasal congestion and dryness left greater than right  Eyes: Conjunctivae and EOM are normal. Pupils are equal, round, and reactive to light. Right eye exhibits no discharge. Left eye exhibits no discharge. No scleral icterus.  Neck: Normal range of motion. Neck supple. No thyromegaly present.  Cardiovascular: Normal rate, regular rhythm, normal heart sounds and intact distal pulses.  Exam reveals no gallop and no friction rub.   No murmur heard. At 84 per minute  Pulmonary/Chest: Effort normal and breath sounds normal. No respiratory distress. He has no wheezes. He has no rales. He exhibits no tenderness.  No axillary adenopathy  Abdominal: Soft. Bowel sounds are normal. He exhibits no mass. There is no tenderness. There is no rebound and no guarding.  Musculoskeletal: Normal range of motion. He exhibits no edema and no tenderness.  Lymphadenopathy:    He has no cervical adenopathy.  Neurological: He is alert and oriented to person, place, and time. He has normal reflexes. No cranial nerve deficit.  Skin: Skin is warm and dry. No rash noted. No erythema. No pallor.  The toenails of both feet have tinea unguium  Psychiatric: He has a normal mood and affect. His behavior is normal. Judgment and thought content normal.   BP 161/84  Pulse 92  Temp(Src) 98.1 F (36.7 C) (Oral)  Ht 5' 7.5"  (1.715 m)  Wt 206 lb (93.441 kg)  BMI 31.77 kg/m2        Assessment & Plan:  1. Chronic kidney disease (CKD), stage IV (severe) - POCT CBC - BMP8+EGFR  2. History of prostate cancer - POCT CBC - PSA, total and free  3. Hyperlipidemia - POCT CBC - NMR, lipoprofile  4. Hypertension - POCT CBC - BMP8+EGFR - Hepatic function panel  5. Vitamin D deficiency - POCT CBC - Vit D  25 hydroxy (rtn osteoporosis monitoring)  6. Metabolic syndrome -Check hemoglobin A1c - POCT glycosylated hemoglobin (Hb A1C)  No orders of the defined types were placed in this encounter.   Patient Instructions  Keep appointments with the urologist and the nephrologist Be careful and don't put yourself at risk for falling Return the FOBT Continue current medications Drink plenty of fluids use saline nose spray frequently in the day Use a cool mist humidifier in her bedroom at nighttime    Arrie Senate MD

## 2014-02-13 LAB — PSA, TOTAL AND FREE
PSA FREE PCT: 15 %
PSA, Free: 0.03 ng/mL
PSA: 0.2 ng/mL (ref 0.0–4.0)

## 2014-02-13 LAB — NMR, LIPOPROFILE
Cholesterol: 121 mg/dL (ref <200)
HDL Cholesterol by NMR: 49 mg/dL (ref >=40)
HDL Particle Number: 39 umol/L (ref >=30.5)
LDL Particle Number: 790 nmol/L (ref <1000)
LDL SIZE: 20.5 nm — AB (ref >20.5)
LDLC SERPL CALC-MCNC: 57 mg/dL (ref <100)
LP-IR SCORE: 69 — AB (ref <=45)
SMALL LDL PARTICLE NUMBER: 374 nmol/L (ref <=527)
Triglycerides by NMR: 77 mg/dL (ref <150)

## 2014-02-13 LAB — BMP8+EGFR
BUN/Creatinine Ratio: 15 (ref 10–22)
BUN: 29 mg/dL — ABNORMAL HIGH (ref 8–27)
CHLORIDE: 106 mmol/L (ref 97–108)
CO2: 20 mmol/L (ref 18–29)
Calcium: 10.3 mg/dL — ABNORMAL HIGH (ref 8.6–10.2)
Creatinine, Ser: 1.98 mg/dL — ABNORMAL HIGH (ref 0.76–1.27)
GFR calc Af Amer: 37 mL/min/{1.73_m2} — ABNORMAL LOW (ref >59)
GFR calc non Af Amer: 32 mL/min/{1.73_m2} — ABNORMAL LOW (ref >59)
GLUCOSE: 104 mg/dL — AB (ref 65–99)
POTASSIUM: 5.1 mmol/L (ref 3.5–5.2)
Sodium: 144 mmol/L (ref 134–144)

## 2014-02-13 LAB — HEPATIC FUNCTION PANEL
ALK PHOS: 45 IU/L (ref 39–117)
ALT: 25 IU/L (ref 0–44)
AST: 33 IU/L (ref 0–40)
Albumin: 4.7 g/dL (ref 3.5–4.8)
BILIRUBIN DIRECT: 0.18 mg/dL (ref 0.00–0.40)
BILIRUBIN TOTAL: 0.5 mg/dL (ref 0.0–1.2)
Total Protein: 7.2 g/dL (ref 6.0–8.5)

## 2014-02-13 LAB — VITAMIN D 25 HYDROXY (VIT D DEFICIENCY, FRACTURES): Vit D, 25-Hydroxy: 41.9 ng/mL (ref 30.0–100.0)

## 2014-06-25 ENCOUNTER — Encounter: Payer: Self-pay | Admitting: Family Medicine

## 2014-06-25 ENCOUNTER — Ambulatory Visit (INDEPENDENT_AMBULATORY_CARE_PROVIDER_SITE_OTHER): Payer: Medicare Other | Admitting: Family Medicine

## 2014-06-25 VITALS — BP 143/83 | HR 92 | Temp 97.3°F | Ht 67.5 in | Wt 203.0 lb

## 2014-06-25 DIAGNOSIS — N184 Chronic kidney disease, stage 4 (severe): Secondary | ICD-10-CM

## 2014-06-25 DIAGNOSIS — E8881 Metabolic syndrome: Secondary | ICD-10-CM

## 2014-06-25 DIAGNOSIS — Z8546 Personal history of malignant neoplasm of prostate: Secondary | ICD-10-CM

## 2014-06-25 DIAGNOSIS — E785 Hyperlipidemia, unspecified: Secondary | ICD-10-CM

## 2014-06-25 DIAGNOSIS — I10 Essential (primary) hypertension: Secondary | ICD-10-CM

## 2014-06-25 DIAGNOSIS — E559 Vitamin D deficiency, unspecified: Secondary | ICD-10-CM

## 2014-06-25 DIAGNOSIS — J189 Pneumonia, unspecified organism: Secondary | ICD-10-CM

## 2014-06-25 LAB — POCT CBC
GRANULOCYTE PERCENT: 69.6 % (ref 37–80)
HCT, POC: 42.5 % — AB (ref 43.5–53.7)
HEMOGLOBIN: 14.1 g/dL (ref 14.1–18.1)
Lymph, poc: 2.1 (ref 0.6–3.4)
MCH, POC: 28.7 pg (ref 27–31.2)
MCHC: 33.2 g/dL (ref 31.8–35.4)
MCV: 86.4 fL (ref 80–97)
MPV: 7.4 fL (ref 0–99.8)
POC Granulocyte: 5.2 (ref 2–6.9)
POC LYMPH %: 27.4 % (ref 10–50)
Platelet Count, POC: 420 10*3/uL (ref 142–424)
RBC: 4.9 M/uL (ref 4.69–6.13)
RDW, POC: 13.4 %
WBC: 7.5 10*3/uL (ref 4.6–10.2)

## 2014-06-25 NOTE — Patient Instructions (Addendum)
Medicare Annual Wellness Visit  [facility] and the medical providers at [facility] strive to bring you the best medical care.  In doing so we not only want to address your current medical conditions and concerns but also to detect new conditions early and prevent illness, disease and health-related problems.    Medicare offers a yearly Wellness Visit which allows our clinical staff to assess your need for preventative services including immunizations, lifestyle education, counseling to decrease risk of preventable diseases and screening for fall risk and other medical concerns.    This visit is provided free of charge (no copay) for all Medicare recipients. The clinical pharmacists at [facility] have begun to conduct these Wellness Visits which will also include a thorough review of all your medications.    As you primary medical provider recommend that you make an appointment for your Annual Wellness Visit if you have not done so already this year.  You may set up this appointment before you leave today or you may call back [phone] and schedule an appointment.  Please make sure when you call that you mention that you are scheduling your Annual Wellness Visit with the clinical pharmacist so that the appointment may be made for the proper length of time.     Continue current medications. Continue good therapeutic lifestyle changes which include good diet and exercise. Fall precautions discussed with patient. If an FOBT was given today- please return it to our front desk. If you are over 76 years old - you may need Prevnar 57 or the adult Pneumonia vaccine.  Continue and complete antibiotics Drink plenty of fluids Get plenty of rest If any change return to clinic sooner Purchase Mucinex, blue and white,plain, over-the-counter and take one twice a day with a large glass of water to help loosen cough and congestion

## 2014-06-25 NOTE — Progress Notes (Signed)
Subjective:    [name], male    DOB: Nov 01, 1936, 78 y.o.   MRN: [id number]  HPI Pt here for follow up and management of chronic medical problems. Patient was also in [facility] for 2 days last week for pneumonia. The hospital discharge summary was reviewed. He had community-acquired pneumonia, acute renal failure and severe sepsis secondary to pneumonia. He comes in today for recheck and followup. His main complaint is weakness. He is still taking Augmentin and Zithromax. The WBC in the hospital was 12,800. Creatinine was 2.33. His last creatinine through ou r office is 1.98 and this was in February.      Patient Active Problem List   Diagnosis Date Noted  . Metabolic syndrome 50/53/9767  . Hypertension 05/28/2013  . Hyperlipidemia 05/28/2013  . Vitamin D deficiency 05/28/2013  . Chronic kidney disease (CKD), stage IV (severe) 05/28/2013  . History of prostate cancer 05/28/2013  . Gout 05/28/2013   Outpatient Encounter Prescriptions as of 06/25/2014  Medication Sig  . allopurinol (ZYLOPRIM) 100 MG tablet Take 1 tablet by mouth  daily  . amLODipine (NORVASC) 5 MG tablet Take 1 and 1/2 tablets by  mouth every day  . amoxicillin-clavulanate (AUGMENTIN) 875-125 MG per tablet Take 1 tablet by mouth 2 (two) times daily.  Marland Kitchen aspirin EC 81 MG tablet Take 81 mg by mouth daily.  Marland Kitchen azithromycin (ZITHROMAX) 250 MG tablet Take by mouth daily.  . cholecalciferol (VITAMIN D) 1000 UNITS tablet Take 1,000 Units by mouth daily.  . clotrimazole-betamethasone (LOTRISONE) cream Apply topically 2 (two) times daily.  . fenofibrate 160 MG tablet TAKE ONE TABLET BY MOUTH EVERY DAY  . fish oil-omega-3 fatty acids 1000 MG capsule Take 1 g by mouth 2 (two) times daily.  Marland Kitchen levothyroxine (SYNTHROID, LEVOTHROID) 50 MCG tablet Take 1 tablet by mouth  daily  . pravastatin (PRAVACHOL) 80 MG tablet TAKE ONE TABLET BY MOUTH ONCE DAILY  . vitamin C (ASCORBIC ACID) 500 MG tablet Take 500 mg by  mouth daily.  Marland Kitchen losartan-hydrochlorothiazide (HYZAAR) 100-25 MG per tablet Take 1 tablet by mouth daily.    Review of Systems  HENT: Negative.   Eyes: Negative.   Respiratory: Negative.   Cardiovascular: Negative.   Gastrointestinal: Negative.   Endocrine: Negative.   Genitourinary: Negative.   Musculoskeletal: Negative.   Skin: Negative.   Allergic/Immunologic: Negative.   Neurological: Positive for weakness.  Hematological: Negative.   Psychiatric/Behavioral: Negative.        Objective:   Physical Exam  Nursing note and vitals reviewed. Constitutional: He is oriented to person, place, and time. He appears well-developed and well-nourished. No distress.  HENT:  Head: Normocephalic and atraumatic.  Right Ear: External ear normal.  Left Ear: External ear normal.  Nose: Nose normal.  Mouth/Throat: Oropharynx is clear and moist. No oropharyngeal exudate.  Eyes: Conjunctivae and EOM are normal. Pupils are equal, round, and reactive to light. Right eye exhibits no discharge. Left eye exhibits no discharge. No scleral icterus.  Neck: Normal range of motion. Neck supple. No thyromegaly present.  Cardiovascular: Normal rate, regular rhythm, normal heart sounds and intact distal pulses.  Exam reveals no gallop and no friction rub.   No murmur heard. At 84 per minute  Pulmonary/Chest: Effort normal and breath sounds normal. No respiratory distress. He has no wheezes. He has no rales. He exhibits no tenderness.  The chest was clear anteriorly and posteriorly. There was a slightly tight cough.  Abdominal: Soft. Bowel sounds  are normal.  Musculoskeletal: Normal range of motion. He exhibits no edema.  Lymphadenopathy:    He has no cervical adenopathy.  Neurological: He is alert and oriented to person, place, and time.  Skin: Skin is warm and dry. No rash noted.  Psychiatric: He has a normal mood and affect. His behavior is normal. Judgment and thought content normal.   BP 143/83   Pulse 92  Temp(Src) 97.3 F (36.3 C) (Oral)  Ht 5' 7.5" (1.715 m)  Wt 203 lb (92.08 kg)  BMI 31.31 kg/m2        Assessment & Plan:  1. Chronic kidney disease (CKD), stage IV (severe) - POCT CBC - BMP8+EGFR  2. History of prostate cancer - POCT CBC  3. Hyperlipidemia - POCT CBC - NMR, lipoprofile  4. Essential hypertension - POCT CBC - BMP8+EGFR - Hepatic function panel  5. Metabolic syndrome - POCT CBC - POCT glycosylated hemoglobin (Hb A1C)  6. Vitamin D deficiency - POCT CBC - Vit D  25 hydroxy (rtn osteoporosis monitoring)  7. CAP (community acquired pneumonia) -CBC and BMP -Chest x-ray at future visit  Patient Instructions                       Medicare Annual Wellness Visit  [facility] and the medical providers at [facility] strive to bring you the best medical care.  In doing so we not only want to address your current medical conditions and concerns but also to detect new conditions early and prevent illness, disease and health-related problems.    Medicare offers a yearly Wellness Visit which allows our clinical staff to assess your need for preventative services including immunizations, lifestyle education, counseling to decrease risk of preventable diseases and screening for fall risk and other medical concerns.    This visit is provided free of charge (no copay) for all Medicare recipients. The clinical pharmacists at [facility] have begun to conduct these Wellness Visits which will also include a thorough review of all your medications.    As you primary medical provider recommend that you make an appointment for your Annual Wellness Visit if you have not done so already this year.  You may set up this appointment before you leave today or you may call back [phone] and schedule an appointment.  Please make sure when you call that you mention that you are scheduling your Annual Wellness Visit with the  clinical pharmacist so that the appointment may be made for the proper length of time.     Continue current medications. Continue good therapeutic lifestyle changes which include good diet and exercise. Fall precautions discussed with patient. If an FOBT was given today- please return it to our front desk. If you are over 72 years old - you may need Prevnar 30 or the adult Pneumonia vaccine.  Continue and complete antibiotics Drink plenty of fluids Get plenty of rest If any change return to clinic sooner Purchase Mucinex, blue and white,plain, over-the-counter and take one twice a day with a large glass of water to help loosen cough and congestion   Arrie Senate MD

## 2014-06-26 LAB — BMP8+EGFR
BUN/Creatinine Ratio: 12 (ref 10–22)
BUN: 22 mg/dL (ref 8–27)
CALCIUM: 10 mg/dL (ref 8.6–10.2)
CO2: 22 mmol/L (ref 18–29)
Chloride: 101 mmol/L (ref 97–108)
Creatinine, Ser: 1.78 mg/dL — ABNORMAL HIGH (ref 0.76–1.27)
GFR calc Af Amer: 42 mL/min/{1.73_m2} — ABNORMAL LOW (ref >59)
GFR calc non Af Amer: 36 mL/min/{1.73_m2} — ABNORMAL LOW (ref >59)
GLUCOSE: 94 mg/dL (ref 65–99)
POTASSIUM: 4.3 mmol/L (ref 3.5–5.2)
Sodium: 140 mmol/L (ref 134–144)

## 2014-06-27 ENCOUNTER — Other Ambulatory Visit: Payer: Self-pay | Admitting: Family Medicine

## 2014-06-30 NOTE — Progress Notes (Signed)
Pt aware of lab results 

## 2014-07-08 ENCOUNTER — Ambulatory Visit (INDEPENDENT_AMBULATORY_CARE_PROVIDER_SITE_OTHER): Payer: Medicare Other | Admitting: Family Medicine

## 2014-07-08 ENCOUNTER — Ambulatory Visit (INDEPENDENT_AMBULATORY_CARE_PROVIDER_SITE_OTHER): Payer: Medicare Other

## 2014-07-08 ENCOUNTER — Encounter: Payer: Self-pay | Admitting: Family Medicine

## 2014-07-08 VITALS — BP 149/88 | HR 86 | Temp 98.0°F | Ht 67.5 in | Wt 202.0 lb

## 2014-07-08 DIAGNOSIS — J189 Pneumonia, unspecified organism: Secondary | ICD-10-CM

## 2014-07-08 DIAGNOSIS — I10 Essential (primary) hypertension: Secondary | ICD-10-CM

## 2014-07-08 DIAGNOSIS — Z8546 Personal history of malignant neoplasm of prostate: Secondary | ICD-10-CM

## 2014-07-08 DIAGNOSIS — L570 Actinic keratosis: Secondary | ICD-10-CM

## 2014-07-08 DIAGNOSIS — E559 Vitamin D deficiency, unspecified: Secondary | ICD-10-CM

## 2014-07-08 DIAGNOSIS — E785 Hyperlipidemia, unspecified: Secondary | ICD-10-CM

## 2014-07-08 DIAGNOSIS — N184 Chronic kidney disease, stage 4 (severe): Secondary | ICD-10-CM

## 2014-07-08 DIAGNOSIS — E8881 Metabolic syndrome: Secondary | ICD-10-CM

## 2014-07-08 LAB — POCT GLYCOSYLATED HEMOGLOBIN (HGB A1C): Hemoglobin A1C: 5.3

## 2014-07-08 NOTE — Progress Notes (Signed)
Subjective:    [name], male    DOB: 1936-01-07, 78 y.o.   MRN: [id number]  HPI Pt here for follow up and management of chronic medical problems. The patient continues to do well following his recent bout with community acquired pneumonia and hospitalization. He is due to get a followup chest x-ray today. He also wants me to look at a skin lesion on his right  ear. Please see the home blood pressures the patient brought in. These were reviewed and they're all well within the normal range.         Patient Active Problem List   Diagnosis Date Noted  . Metabolic syndrome 40/98/1191  . Hypertension 05/28/2013  . Hyperlipidemia 05/28/2013  . Vitamin D deficiency 05/28/2013  . Chronic kidney disease (CKD), stage IV (severe) 05/28/2013  . History of prostate cancer 05/28/2013  . Gout 05/28/2013   Outpatient Encounter Prescriptions as of 07/08/2014  Medication Sig  . allopurinol (ZYLOPRIM) 100 MG tablet Take 1 tablet by mouth  daily  . amLODipine (NORVASC) 5 MG tablet Take 1 and 1/2 tablets by  mouth every day  . aspirin EC 81 MG tablet Take 81 mg by mouth daily.  . cholecalciferol (VITAMIN D) 1000 UNITS tablet Take 1,000 Units by mouth daily.  . fenofibrate 160 MG tablet TAKE ONE TABLET BY MOUTH ONCE DAILY  . fish oil-omega-3 fatty acids 1000 MG capsule Take 1 g by mouth 2 (two) times daily.  Marland Kitchen levothyroxine (SYNTHROID, LEVOTHROID) 50 MCG tablet Take 1 tablet by mouth  daily  . losartan-hydrochlorothiazide (HYZAAR) 100-25 MG per tablet Take 1 tablet by mouth daily.  . pravastatin (PRAVACHOL) 80 MG tablet TAKE ONE TABLET BY MOUTH ONCE DAILY  . vitamin C (ASCORBIC ACID) 500 MG tablet Take 500 mg by mouth daily.  . clotrimazole-betamethasone (LOTRISONE) cream Apply topically 2 (two) times daily.  . [DISCONTINUED] amoxicillin-clavulanate (AUGMENTIN) 875-125 MG per tablet Take 1 tablet by mouth 2 (two) times daily.  . [DISCONTINUED] azithromycin (ZITHROMAX) 250 MG tablet  Take by mouth daily.    Review of Systems  Constitutional: Negative.   HENT: Negative.   Eyes: Negative.   Respiratory: Negative.   Cardiovascular: Negative.   Gastrointestinal: Negative.   Endocrine: Negative.   Genitourinary: Negative.   Musculoskeletal: Negative.   Skin: Negative.        Skin lesion on right ear  Allergic/Immunologic: Negative.   Neurological: Negative.   Hematological: Negative.   Psychiatric/Behavioral: Negative.        Objective:   Physical Exam  Nursing note and vitals reviewed. Constitutional: He is oriented to person, place, and time. He appears well-developed and well-nourished. No distress.  Alert pleasant and feeling well  HENT:  Head: Normocephalic and atraumatic.  Right Ear: External ear normal.  Left Ear: External ear normal.  Nose: Nose normal.  Mouth/Throat: Oropharynx is clear and moist. No oropharyngeal exudate.  Eyes: Conjunctivae and EOM are normal. Pupils are equal, round, and reactive to light. Right eye exhibits no discharge. Left eye exhibits no discharge. No scleral icterus.  Neck: Normal range of motion. Neck supple. No thyromegaly present.  Cardiovascular: Normal rate, regular rhythm and intact distal pulses.   No murmur heard. At 84 per minute  Pulmonary/Chest: Effort normal and breath sounds normal. No respiratory distress. He has no wheezes. He has no rales. He exhibits no tenderness.  The breath sounds were good anteriorly and posterior  Abdominal: Soft. Bowel sounds are normal. He exhibits no mass.  There is no tenderness. There is no rebound and no guarding.  Musculoskeletal: Normal range of motion. He exhibits no edema and no tenderness.  Lymphadenopathy:    He has no cervical adenopathy.  Neurological: He is alert and oriented to person, place, and time. He has normal reflexes. No cranial nerve deficit.  Skin: Skin is warm and dry. No rash noted. No erythema. No pallor.  Cryotherapy to right superior ear pinna appears  to be an actinic keratosis. This was done successfully and without problems.  Psychiatric: He has a normal mood and affect. His behavior is normal. Judgment and thought content normal.   BP 149/88  Pulse 86  Temp(Src) 98 F (36.7 C) (Oral)  Ht 5' 7.5" (1.715 m)  Wt 202 lb (91.627 kg)  BMI 31.15 kg/m2  [facility] reading (PRIMARY) by  Dr. Rita Ohara-- pneumonia resolved                                       Assessment & Plan:  1. Chronic kidney disease (CKD), stage IV (severe) - POCT CBC  2. Hyperlipidemia - POCT CBC - NMR, lipoprofile  3. Essential hypertension - DG Chest 2 View; Future - POCT CBC - BMP8+EGFR - Hepatic function panel  4. Metabolic syndrome - POCT CBC - POCT glycosylated hemoglobin (Hb A1C)  5. Vitamin D deficiency - POCT CBC - Vit D  25 hydroxy (rtn osteoporosis monitoring)  6. History of prostate cancer - POCT CBC  7. CAP (community acquired pneumonia), followup  8. Actinic keratosis  Patient Instructions                       Medicare Annual Wellness Visit  [facility] and the medical providers at [facility] strive to bring you the best medical care.  In doing so we not only want to address your current medical conditions and concerns but also to detect new conditions early and prevent illness, disease and health-related problems.    Medicare offers a yearly Wellness Visit which allows our clinical staff to assess your need for preventative services including immunizations, lifestyle education, counseling to decrease risk of preventable diseases and screening for fall risk and other medical concerns.    This visit is provided free of charge (no copay) for all Medicare recipients. The clinical pharmacists at [facility] have begun to conduct these Wellness Visits which will also include a thorough review of all your medications.    As you primary medical provider recommend that you make an appointment for  your Annual Wellness Visit if you have not done so already this year.  You may set up this appointment before you leave today or you may call back [phone]) and schedule an appointment.  Please make sure when you call that you mention that you are scheduling your Annual Wellness Visit with the clinical pharmacist so that the appointment may be made for the proper length of time.     Continue current medications. Continue good therapeutic lifestyle changes which include good diet and exercise. Fall precautions discussed with patient. If an FOBT was given today- please return it to our front desk. If you are over 96 years old - you may need Prevnar 76 or the adult Pneumonia vaccine.  Continue to gradually increase your activity Drink lots of water Protect your airways from environmental irritants The medication that most  likely cause your loose bowel movements that you took while you were in the hospital was Augmentin. You may want to remember this. We will call you with the results of the chest x-ray and lab work to send [place] those results are available Return the FOBT The lesion that was frozen on your right ear may form a small blister and that may rupture and form a scab, the scab comes off, the lesion should be gone. If it is not, we may need to have you see the dermatologist.   Arrie Senate MD

## 2014-07-08 NOTE — Patient Instructions (Addendum)
Medicare Annual Wellness Visit  [facility] and the medical providers at [facility] strive to bring you the best medical care.  In doing so we not only want to address your current medical conditions and concerns but also to detect new conditions early and prevent illness, disease and health-related problems.    Medicare offers a yearly Wellness Visit which allows our clinical staff to assess your need for preventative services including immunizations, lifestyle education, counseling to decrease risk of preventable diseases and screening for fall risk and other medical concerns.    This visit is provided free of charge (no copay) for all Medicare recipients. The clinical pharmacists at [facility] have begun to conduct these Wellness Visits which will also include a thorough review of all your medications.    As you primary medical provider recommend that you make an appointment for your Annual Wellness Visit if you have not done so already this year.  You may set up this appointment before you leave today or you may call back [phone] and schedule an appointment.  Please make sure when you call that you mention that you are scheduling your Annual Wellness Visit with the clinical pharmacist so that the appointment may be made for the proper length of time.     Continue current medications. Continue good therapeutic lifestyle changes which include good diet and exercise. Fall precautions discussed with patient. If an FOBT was given today- please return it to our front desk. If you are over 26 years old - you may need Prevnar 53 or the adult Pneumonia vaccine.  Continue to gradually increase your activity Drink lots of water Protect your airways from environmental irritants The medication that most likely cause your loose bowel movements that you took while you were in the hospital was Augmentin. You may want to remember  this. We will call you with the results of the chest x-ray and lab work to send [place] those results are available Return the FOBT The lesion that was frozen on your right ear may form a small blister and that may rupture and form a scab, the scab comes off, the lesion should be gone. If it is not, we may need to have you see the dermatologist.

## 2014-07-09 ENCOUNTER — Telehealth: Payer: Self-pay

## 2014-07-09 LAB — NMR, LIPOPROFILE
Cholesterol: 141 mg/dL (ref 100–199)
HDL Cholesterol by NMR: 53 mg/dL (ref >39)
HDL PARTICLE NUMBER: 44.5 umol/L (ref >=30.5)
LDL Particle Number: 615 nmol/L (ref <1000)
LDL Size: 21.5 nm (ref >20.5)
LDLC SERPL CALC-MCNC: 69 mg/dL (ref 0–99)
LP-IR Score: 45 (ref <=45)
SMALL LDL PARTICLE NUMBER: 220 nmol/L (ref <=527)
Triglycerides by NMR: 94 mg/dL (ref 0–149)

## 2014-07-09 LAB — BMP8+EGFR
BUN / CREAT RATIO: 15 (ref 10–22)
BUN: 29 mg/dL — AB (ref 8–27)
CHLORIDE: 102 mmol/L (ref 97–108)
CO2: 23 mmol/L (ref 18–29)
Calcium: 10.4 mg/dL — ABNORMAL HIGH (ref 8.6–10.2)
Creatinine, Ser: 1.89 mg/dL — ABNORMAL HIGH (ref 0.76–1.27)
GFR calc non Af Amer: 34 mL/min/{1.73_m2} — ABNORMAL LOW (ref >59)
GFR, EST AFRICAN AMERICAN: 39 mL/min/{1.73_m2} — AB (ref >59)
Glucose: 93 mg/dL (ref 65–99)
Potassium: 4.5 mmol/L (ref 3.5–5.2)
SODIUM: 142 mmol/L (ref 134–144)

## 2014-07-09 LAB — CBC WITH DIFFERENTIAL
Basophils Absolute: 0.1 10*3/uL (ref 0.0–0.2)
Basos: 1 %
Eos: 2 %
Eosinophils Absolute: 0.2 10*3/uL (ref 0.0–0.4)
HCT: 43.3 % (ref 37.5–51.0)
Hemoglobin: 14.5 g/dL (ref 12.6–17.7)
Immature Grans (Abs): 0 10*3/uL (ref 0.0–0.1)
Immature Granulocytes: 0 %
Lymphocytes Absolute: 3.4 10*3/uL — ABNORMAL HIGH (ref 0.7–3.1)
Lymphs: 37 %
MCH: 28.8 pg (ref 26.6–33.0)
MCHC: 33.5 g/dL (ref 31.5–35.7)
MCV: 86 fL (ref 79–97)
MONOS ABS: 0.8 10*3/uL (ref 0.1–0.9)
Monocytes: 8 %
Neutrophils Absolute: 4.8 10*3/uL (ref 1.4–7.0)
Neutrophils Relative %: 52 %
PLATELETS: 400 10*3/uL — AB (ref 150–379)
RBC: 5.04 x10E6/uL (ref 4.14–5.80)
RDW: 14.2 % (ref 12.3–15.4)
WBC: 9.2 10*3/uL (ref 3.4–10.8)

## 2014-07-09 LAB — HEPATIC FUNCTION PANEL
ALBUMIN: 4.7 g/dL (ref 3.5–4.8)
ALK PHOS: 50 IU/L (ref 39–117)
ALT: 17 IU/L (ref 0–44)
AST: 27 IU/L (ref 0–40)
Bilirubin, Direct: 0.15 mg/dL (ref 0.00–0.40)
Total Bilirubin: 0.3 mg/dL (ref 0.0–1.2)
Total Protein: 7.3 g/dL (ref 6.0–8.5)

## 2014-07-09 LAB — VITAMIN D 25 HYDROXY (VIT D DEFICIENCY, FRACTURES): VIT D 25 HYDROXY: 30.2 ng/mL (ref 30.0–100.0)

## 2014-07-09 NOTE — Telephone Encounter (Signed)
Pt aware of CXR results and need for f/u in 4 weeks Pt will be placed on recall list

## 2014-07-09 NOTE — Telephone Encounter (Signed)
Message copied by Koren Bound on Thu Jul 09, 2014  2:30 PM ------      Message from: Chipper Herb      Created: Wed Jul 08, 2014  5:24 PM       As per radiology report------ repeat chest x-ray per radiologist in 4 weeks ------

## 2014-08-10 ENCOUNTER — Encounter (INDEPENDENT_AMBULATORY_CARE_PROVIDER_SITE_OTHER): Payer: Self-pay

## 2014-08-10 ENCOUNTER — Other Ambulatory Visit (INDEPENDENT_AMBULATORY_CARE_PROVIDER_SITE_OTHER): Payer: Medicare Other

## 2014-08-10 DIAGNOSIS — R7989 Other specified abnormal findings of blood chemistry: Secondary | ICD-10-CM

## 2014-08-10 LAB — POCT CBC
Granulocyte percent: 68.6 %G (ref 37–80)
HCT, POC: 43.7 % (ref 43.5–53.7)
Hemoglobin: 14.3 g/dL (ref 14.1–18.1)
Lymph, poc: 2.2 (ref 0.6–3.4)
MCH: 28.7 pg (ref 27–31.2)
MCHC: 32.8 g/dL (ref 31.8–35.4)
MCV: 87.5 fL (ref 80–97)
MPV: 7.5 fL (ref 0–99.8)
POC Granulocyte: 5.3 (ref 2–6.9)
POC LYMPH PERCENT: 28.3 %L (ref 10–50)
Platelet Count, POC: 293 10*3/uL (ref 142–424)
RBC: 5 M/uL (ref 4.69–6.13)
RDW, POC: 13.9 %
WBC: 7.7 10*3/uL (ref 4.6–10.2)

## 2014-08-10 NOTE — Progress Notes (Signed)
Pt came in for lab  only 

## 2014-08-19 ENCOUNTER — Telehealth: Payer: Self-pay

## 2014-08-19 NOTE — Telephone Encounter (Signed)
LM with wife for patient to call us back about f/u x-ray for resolution of pneumonia; Per wife, pt is much better. He may not want to repeat CXR ; If he does, please give him the hours we are available

## 2014-08-20 ENCOUNTER — Telehealth: Payer: Self-pay

## 2014-08-20 ENCOUNTER — Ambulatory Visit (INDEPENDENT_AMBULATORY_CARE_PROVIDER_SITE_OTHER): Payer: Medicare Other

## 2014-08-20 ENCOUNTER — Other Ambulatory Visit: Payer: Self-pay | Admitting: Family Medicine

## 2014-08-20 ENCOUNTER — Other Ambulatory Visit: Payer: Medicare Other

## 2014-08-20 DIAGNOSIS — J189 Pneumonia, unspecified organism: Secondary | ICD-10-CM

## 2014-08-20 NOTE — Telephone Encounter (Signed)
[facility] to x-ray 

## 2014-08-20 NOTE — Telephone Encounter (Signed)
Pt came in for f/u CXR

## 2014-08-20 NOTE — Telephone Encounter (Signed)
Message copied by Koren Bound on Thu Aug 20, 2014  3:59 PM ------      Message from: Chipper Herb      Created: Thu Aug 20, 2014  1:19 PM       As per radiology report ------

## 2014-08-20 NOTE — Telephone Encounter (Signed)
Pt aware of CXR results and resolution of pneumonia

## 2014-08-20 NOTE — Telephone Encounter (Signed)
Message copied by Koren Bound on Thu Aug 20, 2014  2:28 PM ------      Message from: Chipper Herb      Created: Thu Aug 20, 2014  1:19 PM       As per radiology report ------

## 2014-09-04 ENCOUNTER — Other Ambulatory Visit: Payer: Medicare Other

## 2014-09-04 DIAGNOSIS — Z1212 Encounter for screening for malignant neoplasm of rectum: Secondary | ICD-10-CM

## 2014-09-04 NOTE — Progress Notes (Signed)
LAB ONLY 

## 2014-09-06 LAB — FECAL OCCULT BLOOD, IMMUNOCHEMICAL: Fecal Occult Bld: NEGATIVE

## 2014-09-19 ENCOUNTER — Other Ambulatory Visit: Payer: Self-pay | Admitting: Family Medicine

## 2014-11-03 ENCOUNTER — Ambulatory Visit: Payer: Medicare Other | Admitting: Family Medicine

## 2014-11-04 ENCOUNTER — Ambulatory Visit (INDEPENDENT_AMBULATORY_CARE_PROVIDER_SITE_OTHER): Payer: Medicare Other | Admitting: Family Medicine

## 2014-11-04 ENCOUNTER — Encounter: Payer: Self-pay | Admitting: Family Medicine

## 2014-11-04 VITALS — BP 142/78 | HR 85 | Temp 97.3°F | Ht 67.5 in | Wt 205.0 lb

## 2014-11-04 DIAGNOSIS — E8881 Metabolic syndrome: Secondary | ICD-10-CM

## 2014-11-04 DIAGNOSIS — Z8546 Personal history of malignant neoplasm of prostate: Secondary | ICD-10-CM

## 2014-11-04 DIAGNOSIS — I10 Essential (primary) hypertension: Secondary | ICD-10-CM

## 2014-11-04 DIAGNOSIS — Z23 Encounter for immunization: Secondary | ICD-10-CM

## 2014-11-04 DIAGNOSIS — N184 Chronic kidney disease, stage 4 (severe): Secondary | ICD-10-CM

## 2014-11-04 DIAGNOSIS — E559 Vitamin D deficiency, unspecified: Secondary | ICD-10-CM

## 2014-11-04 DIAGNOSIS — E785 Hyperlipidemia, unspecified: Secondary | ICD-10-CM

## 2014-11-04 LAB — POCT CBC
GRANULOCYTE PERCENT: 68 % (ref 37–80)
HCT, POC: 47.1 % (ref 43.5–53.7)
HEMOGLOBIN: 15.1 g/dL (ref 14.1–18.1)
Lymph, poc: 1.9 (ref 0.6–3.4)
MCH: 28.1 pg (ref 27–31.2)
MCHC: 32.1 g/dL (ref 31.8–35.4)
MCV: 87.6 fL (ref 80–97)
MPV: 7 fL (ref 0–99.8)
POC GRANULOCYTE: 4.8 (ref 2–6.9)
POC LYMPH PERCENT: 26.3 %L (ref 10–50)
Platelet Count, POC: 295 10*3/uL (ref 142–424)
RBC: 5.4 M/uL (ref 4.69–6.13)
RDW, POC: 13.5 %
WBC: 7.1 10*3/uL (ref 4.6–10.2)

## 2014-11-04 MED ORDER — LEVOTHYROXINE SODIUM 50 MCG PO TABS: ORAL_TABLET | ORAL | Status: DC

## 2014-11-04 MED ORDER — AMLODIPINE BESYLATE 5 MG PO TABS: ORAL_TABLET | ORAL | Status: DC

## 2014-11-04 MED ORDER — PRAVASTATIN SODIUM 80 MG PO TABS: ORAL_TABLET | ORAL | Status: DC

## 2014-11-04 MED ORDER — ALLOPURINOL 100 MG PO TABS: ORAL_TABLET | ORAL | Status: DC

## 2014-11-04 MED ORDER — FENOFIBRATE 160 MG PO TABS: ORAL_TABLET | ORAL | Status: DC

## 2014-11-04 MED ORDER — CLOTRIMAZOLE-BETAMETHASONE 1-0.05 % EX CREA: TOPICAL_CREAM | Freq: Two times a day (BID) | CUTANEOUS | Status: DC

## 2014-11-04 MED ORDER — LOSARTAN POTASSIUM-HCTZ 100-25 MG PO TABS: 1.0000 | ORAL_TABLET | Freq: Every day | ORAL | Status: DC

## 2014-11-04 NOTE — Progress Notes (Signed)
Subjective:    [name], male    DOB: July 27, 1936, 78 y.o.   MRN: [id number]  HPI Pt here for follow up and management of chronic medical problems. The patient comes in today with no specific complaints. He brings in his home blood pressures for review and all of these are excellent. A copy of these will be scanned into the record. He is due to receive his flu shot today and to get his lab work today. He needs all his medications refilled today. The patient is due to get his eyes checked son. He has seen the nephrologist, Dr. Jacqualin Combes up recently and he will not see him back again for a year. He is followed by Dr. Jeffie Pollock for his prostate cancer every February. He has a history of seed implants in 2009.he sees Dr. Jeffie Pollock yearly.         Patient Active Problem List   Diagnosis Date Noted  . Metabolic syndrome 33/54/5625  . Hypertension 05/28/2013  . Hyperlipidemia 05/28/2013  . Vitamin D deficiency 05/28/2013  . Chronic kidney disease (CKD), stage IV (severe) 05/28/2013  . History of prostate cancer 05/28/2013  . Gout 05/28/2013   Outpatient Encounter Prescriptions as of 11/04/2014  Medication Sig  . allopurinol (ZYLOPRIM) 100 MG tablet Take 1 tablet by mouth  daily  . amLODipine (NORVASC) 5 MG tablet Take 1 and 1/2 tablets by  mouth every day  . aspirin EC 81 MG tablet Take 81 mg by mouth daily.  . cholecalciferol (VITAMIN D) 1000 UNITS tablet Take 1,000 Units by mouth daily.  . clotrimazole-betamethasone (LOTRISONE) cream Apply topically 2 (two) times daily.  . fenofibrate 160 MG tablet TAKE ONE TABLET BY MOUTH ONCE DAILY  . fish oil-omega-3 fatty acids 1000 MG capsule Take 1 g by mouth 2 (two) times daily.  Marland Kitchen levothyroxine (SYNTHROID, LEVOTHROID) 50 MCG tablet Take 1 tablet by mouth  daily  . losartan-hydrochlorothiazide (HYZAAR) 100-25 MG per tablet Take 1 tablet by mouth daily.  . pravastatin (PRAVACHOL) 80 MG tablet TAKE ONE TABLET BY MOUTH ONCE DAILY  . vitamin  C (ASCORBIC ACID) 500 MG tablet Take 500 mg by mouth daily.    Review of Systems  Constitutional: Negative.   HENT: Negative.   Eyes: Negative.   Respiratory: Negative.   Cardiovascular: Negative.   Gastrointestinal: Negative.   Endocrine: Negative.   Genitourinary: Negative.   Musculoskeletal: Negative.   Skin: Negative.   Allergic/Immunologic: Negative.   Neurological: Negative.   Hematological: Negative.   Psychiatric/Behavioral: Negative.        Objective:   Physical Exam  Constitutional: He is oriented to person, place, and time. He appears well-developed and well-nourished. No distress.  HENT:  Head: Normocephalic and atraumatic.  Right Ear: External ear normal.  Left Ear: External ear normal.  Mouth/Throat: Oropharynx is clear and moist. No oropharyngeal exudate.  Nasal congestion bilaterally  Eyes: Conjunctivae and EOM are normal. Pupils are equal, round, and reactive to light. Right eye exhibits no discharge. Left eye exhibits no discharge. No scleral icterus.  Neck: Normal range of motion. Neck supple. No thyromegaly present.  No carotid bruits or anterior cervical adenopathy  Cardiovascular: Normal rate, regular rhythm, normal heart sounds and intact distal pulses.  Exam reveals no gallop and no friction rub.   No murmur heard. At 84/m  Pulmonary/Chest: Effort normal and breath sounds normal. No respiratory distress. He has no wheezes. He has no rales. He exhibits no tenderness.  Abdominal: Soft. Bowel  sounds are normal. He exhibits no mass. There is no tenderness. There is no rebound and no guarding.  Genitourinary: Penis normal.  Musculoskeletal: Normal range of motion. He exhibits no edema or tenderness.  Lymphadenopathy:    He has no cervical adenopathy.  Neurological: He is alert and oriented to person, place, and time. He has normal reflexes. No cranial nerve deficit.  Skin: Skin is warm and dry. No rash noted. No erythema. No pallor.  Psychiatric: He has  a normal mood and affect. His behavior is normal. Judgment and thought content normal.  Nursing note and vitals reviewed.  BP 147/86 mmHg  Pulse 85  Temp(Src) 97.3 F (36.3 C) (Oral)  Ht 5' 7.5" (1.715 m)  Wt 205 lb (92.987 kg)  BMI 31.62 kg/m2  Repeat blood pressure 142/78.      Assessment & Plan:  1. Chronic kidney disease (CKD), stage IV (severe) - POCT CBC - BMP8+EGFR -continue to follow-up with Dr. Justin Mend  2. Hyperlipidemia - POCT CBC - NMR, lipoprofile  3. Essential hypertension - POCT CBC - BMP8+EGFR - Hepatic function panel  4. Metabolic syndrome - POCT CBC  5. Vitamin D deficiency - POCT CBC - Vit D  25 hydroxy (rtn osteoporosis monitoring)  6. History of prostate cancer - POCT CBC -continue follow-up with Dr. Jeffie Pollock  Meds ordered this encounter  Medications  . pravastatin (PRAVACHOL) 80 MG tablet    Sig: TAKE ONE TABLET BY MOUTH ONCE DAILY    Dispense:  90 tablet    Refill:  3  . losartan-hydrochlorothiazide (HYZAAR) 100-25 MG per tablet    Sig: Take 1 tablet by mouth daily.    Dispense:  90 tablet    Refill:  3  . levothyroxine (SYNTHROID, LEVOTHROID) 50 MCG tablet    Sig: Take 1 tablet by mouth  daily    Dispense:  90 tablet    Refill:  3    Need to be seen before next refill  . fenofibrate 160 MG tablet    Sig: TAKE ONE TABLET BY MOUTH ONCE DAILY    Dispense:  90 tablet    Refill:  3  . amLODipine (NORVASC) 5 MG tablet    Sig: Take 1 and 1/2 tablets by  mouth every day    Dispense:  135 tablet    Refill:  3  . allopurinol (ZYLOPRIM) 100 MG tablet    Sig: Take 1 tablet by mouth  daily    Dispense:  90 tablet    Refill:  3  . clotrimazole-betamethasone (LOTRISONE) cream    Sig: Apply topically 2 (two) times daily.    Dispense:  15 g    Refill:  3   Patient Instructions                       Medicare Annual Wellness Visit  [facility] and the medical providers at [facility] strive to bring you the best medical  care.  In doing so we not only want to address your current medical conditions and concerns but also to detect new conditions early and prevent illness, disease and health-related problems.    Medicare offers a yearly Wellness Visit which allows our clinical staff to assess your need for preventative services including immunizations, lifestyle education, counseling to decrease risk of preventable diseases and screening for fall risk and other medical concerns.    This visit is provided free of charge (no copay) for all Medicare recipients. The clinical  pharmacists at [facility] have begun to conduct these Wellness Visits which will also include a thorough review of all your medications.    As you primary medical provider recommend that you make an appointment for your Annual Wellness Visit if you have not done so already this year.  You may set up this appointment before you leave today or you may call back [phone]) and schedule an appointment.  Please make sure when you call that you mention that you are scheduling your Annual Wellness Visit with the clinical pharmacist so that the appointment may be made for the proper length of time.     Continue current medications. Continue good therapeutic lifestyle changes which include good diet and exercise. Fall precautions discussed with patient. If an FOBT was given today- please return it to our front desk. If you are over 83 years old - you may need Prevnar 28 or the adult Pneumonia vaccine.  Flu Shots will be available at our office starting mid- September. Please call and schedule a FLU CLINIC APPOINTMENT.   Continue to follow up with nephrologist and urologist. Do not forget to get your eye exam. Continue to monitor blood pressures at home and watch sodium intake The patient needs to work on losing some weight instead of gaining weight Exercise more   Arrie Senate MD

## 2014-11-04 NOTE — Patient Instructions (Addendum)
Medicare Annual Wellness Visit  [facility] and the medical providers at [facility] strive to bring you the best medical care.  In doing so we not only want to address your current medical conditions and concerns but also to detect new conditions early and prevent illness, disease and health-related problems.    Medicare offers a yearly Wellness Visit which allows our clinical staff to assess your need for preventative services including immunizations, lifestyle education, counseling to decrease risk of preventable diseases and screening for fall risk and other medical concerns.    This visit is provided free of charge (no copay) for all Medicare recipients. The clinical pharmacists at [facility] have begun to conduct these Wellness Visits which will also include a thorough review of all your medications.    As you primary medical provider recommend that you make an appointment for your Annual Wellness Visit if you have not done so already this year.  You may set up this appointment before you leave today or you may call back [phone] and schedule an appointment.  Please make sure when you call that you mention that you are scheduling your Annual Wellness Visit with the clinical pharmacist so that the appointment may be made for the proper length of time.     Continue current medications. Continue good therapeutic lifestyle changes which include good diet and exercise. Fall precautions discussed with patient. If an FOBT was given today- please return it to our front desk. If you are over 28 years old - you may need Prevnar 59 or the adult Pneumonia vaccine.  Flu Shots will be available at our office starting mid- September. Please call and schedule a FLU CLINIC APPOINTMENT.   Continue to follow up with nephrologist and urologist. Do not forget to get your eye exam. Continue to monitor blood pressures at home and watch sodium  intake The patient needs to work on losing some weight instead of gaining weight Exercise more

## 2014-11-05 ENCOUNTER — Telehealth: Payer: Self-pay | Admitting: Family Medicine

## 2014-11-05 LAB — BMP8+EGFR
BUN/Creatinine Ratio: 15 (ref 10–22)
BUN: 31 mg/dL — ABNORMAL HIGH (ref 8–27)
CALCIUM: 10.2 mg/dL (ref 8.6–10.2)
CO2: 21 mmol/L (ref 18–29)
CREATININE: 2.01 mg/dL — AB (ref 0.76–1.27)
Chloride: 103 mmol/L (ref 97–108)
GFR calc Af Amer: 36 mL/min/{1.73_m2} — ABNORMAL LOW (ref >59)
GFR calc non Af Amer: 31 mL/min/{1.73_m2} — ABNORMAL LOW (ref >59)
GLUCOSE: 100 mg/dL — AB (ref 65–99)
Potassium: 4.6 mmol/L (ref 3.5–5.2)
SODIUM: 143 mmol/L (ref 134–144)

## 2014-11-05 LAB — HEPATIC FUNCTION PANEL
ALT: 22 IU/L (ref 0–44)
AST: 29 IU/L (ref 0–40)
Albumin: 4.7 g/dL (ref 3.5–4.8)
Alkaline Phosphatase: 55 IU/L (ref 39–117)
BILIRUBIN TOTAL: 0.5 mg/dL (ref 0.0–1.2)
Bilirubin, Direct: 0.2 mg/dL (ref 0.00–0.40)
Total Protein: 7.6 g/dL (ref 6.0–8.5)

## 2014-11-05 LAB — VITAMIN D 25 HYDROXY (VIT D DEFICIENCY, FRACTURES): Vit D, 25-Hydroxy: 38.6 ng/mL (ref 30.0–100.0)

## 2014-11-05 LAB — NMR, LIPOPROFILE
Cholesterol: 130 mg/dL (ref 100–199)
HDL CHOLESTEROL BY NMR: 57 mg/dL (ref >39)
HDL Particle Number: 40.5 umol/L (ref >=30.5)
LDL PARTICLE NUMBER: 667 nmol/L (ref <1000)
LDL Size: 20.8 nm (ref >20.5)
LDL-C: 59 mg/dL (ref 0–99)
LP-IR Score: 49 — ABNORMAL HIGH (ref <=45)
SMALL LDL PARTICLE NUMBER: 320 nmol/L (ref <=527)
Triglycerides by NMR: 70 mg/dL (ref 0–149)

## 2014-11-05 NOTE — Telephone Encounter (Signed)
Pt aware of lab results.  rs °

## 2014-11-05 NOTE — Telephone Encounter (Signed)
-----   Message from Chipper Herb, MD sent at 11/05/2014  7:24 AM EST ----- The blood sugar slightly elevated at 100. The creatinine, the most important kidney function test remains elevated at 2.01. The GFR or glomerular filtration rate remains decreased. The electrolytes including potassium are within normal limits.----please make sure that a copy of this report is sent to his urologist and nephrologist.++++++ All cholesterol numbers with advanced lipid testing are excellent and at goal, continue current treatment The vitamin D level is good, continue current treatment

## 2015-02-11 ENCOUNTER — Other Ambulatory Visit (INDEPENDENT_AMBULATORY_CARE_PROVIDER_SITE_OTHER): Payer: Medicare Other

## 2015-02-11 DIAGNOSIS — C61 Malignant neoplasm of prostate: Secondary | ICD-10-CM

## 2015-02-11 NOTE — Progress Notes (Signed)
Lab for Dr Jeffie Pollock

## 2015-02-12 LAB — PSA, TOTAL AND FREE
PSA, Free Pct: >10 %
PSA, Free: 0.01 ng/mL
PSA: <0.1 ng/mL (ref 0.0–4.0)

## 2015-03-01 DIAGNOSIS — Z8546 Personal history of malignant neoplasm of prostate: Secondary | ICD-10-CM | POA: Diagnosis not present

## 2015-03-18 ENCOUNTER — Ambulatory Visit (INDEPENDENT_AMBULATORY_CARE_PROVIDER_SITE_OTHER): Payer: Medicare Other | Admitting: Family Medicine

## 2015-03-18 ENCOUNTER — Encounter: Payer: Self-pay | Admitting: Family Medicine

## 2015-03-18 VITALS — BP 130/88 | HR 87 | Temp 96.7°F | Ht 67.5 in | Wt 205.0 lb

## 2015-03-18 DIAGNOSIS — J301 Allergic rhinitis due to pollen: Secondary | ICD-10-CM | POA: Diagnosis not present

## 2015-03-18 DIAGNOSIS — R05 Cough: Secondary | ICD-10-CM | POA: Diagnosis not present

## 2015-03-18 DIAGNOSIS — I10 Essential (primary) hypertension: Secondary | ICD-10-CM | POA: Diagnosis not present

## 2015-03-18 DIAGNOSIS — N183 Chronic kidney disease, stage 3 unspecified: Secondary | ICD-10-CM

## 2015-03-18 DIAGNOSIS — R5383 Other fatigue: Secondary | ICD-10-CM

## 2015-03-18 DIAGNOSIS — E785 Hyperlipidemia, unspecified: Secondary | ICD-10-CM

## 2015-03-18 DIAGNOSIS — I129 Hypertensive chronic kidney disease with stage 1 through stage 4 chronic kidney disease, or unspecified chronic kidney disease: Secondary | ICD-10-CM | POA: Insufficient documentation

## 2015-03-18 DIAGNOSIS — N184 Chronic kidney disease, stage 4 (severe): Secondary | ICD-10-CM

## 2015-03-18 DIAGNOSIS — E559 Vitamin D deficiency, unspecified: Secondary | ICD-10-CM | POA: Diagnosis not present

## 2015-03-18 DIAGNOSIS — Z8546 Personal history of malignant neoplasm of prostate: Secondary | ICD-10-CM

## 2015-03-18 DIAGNOSIS — E8881 Metabolic syndrome: Secondary | ICD-10-CM

## 2015-03-18 DIAGNOSIS — R059 Cough, unspecified: Secondary | ICD-10-CM

## 2015-03-18 LAB — POCT CBC
Granulocyte percent: 69.3 %G (ref 37–80)
HCT, POC: 46.6 % (ref 43.5–53.7)
Hemoglobin: 14.5 g/dL (ref 14.1–18.1)
Lymph, poc: 2.3 (ref 0.6–3.4)
MCH, POC: 27.4 pg (ref 27–31.2)
MCHC: 31.2 g/dL — AB (ref 31.8–35.4)
MCV: 87.7 fL (ref 80–97)
MPV: 7.4 fL (ref 0–99.8)
PLATELET COUNT, POC: 312 10*3/uL (ref 142–424)
POC GRANULOCYTE: 6 (ref 2–6.9)
POC LYMPH %: 26 % (ref 10–50)
RBC: 5.31 M/uL (ref 4.69–6.13)
RDW, POC: 13.7 %
WBC: 8.7 10*3/uL (ref 4.6–10.2)

## 2015-03-18 NOTE — Progress Notes (Signed)
Subjective:    [name], male    DOB: [birth year], 79 y.o.   MRN: [id number]  HPI Pt here for follow up and management of chronic medical problems which includes hypertension and hyperlipidemia. He is taking medications regularly. The patient has a history of hypertension and chronic kidney disease. He has some fatigue today and he still has a lingering cough. He has a history of prostate cancer and sees the urologist regularly for this. He will be getting lab work done today. The patient is followed by Dr. Jeffie Pollock yearly and he is up-to-date on these appointments and this is for his prostate cancer. He is also followed by Dr. Justin Mend yearly for his chronic kidney disease and that usually occurs in November. The patient indicates that his home blood pressures have been running in the 120s over the 70s.        Patient Active Problem List   Diagnosis Date Noted  . Metabolic syndrome 25/04/3975  . Hypertension 05/28/2013  . Hyperlipidemia 05/28/2013  . Vitamin D deficiency 05/28/2013  . Chronic kidney disease (CKD), stage IV (severe) 05/28/2013  . History of prostate cancer 05/28/2013  . Gout 05/28/2013   Outpatient Encounter Prescriptions as of 03/18/2015  Medication Sig  . allopurinol (ZYLOPRIM) 100 MG tablet Take 1 tablet by mouth  daily  . amLODipine (NORVASC) 5 MG tablet Take 1 and 1/2 tablets by  mouth every day  . aspirin EC 81 MG tablet Take 81 mg by mouth daily.  . cholecalciferol (VITAMIN D) 1000 UNITS tablet Take 1,000 Units by mouth daily.  . clotrimazole-betamethasone (LOTRISONE) cream Apply topically 2 (two) times daily.  . fenofibrate 160 MG tablet TAKE ONE TABLET BY MOUTH ONCE DAILY  . fish oil-omega-3 fatty acids 1000 MG capsule Take 1 g by mouth 2 (two) times daily.  Marland Kitchen levothyroxine (SYNTHROID, LEVOTHROID) 50 MCG tablet Take 1 tablet by mouth  daily  . losartan-hydrochlorothiazide (HYZAAR) 100-25 MG per tablet Take 1 tablet by mouth daily.  . pravastatin  (PRAVACHOL) 80 MG tablet TAKE ONE TABLET BY MOUTH ONCE DAILY  . vitamin C (ASCORBIC ACID) 500 MG tablet Take 500 mg by mouth daily.    Review of Systems  Constitutional: Positive for fatigue.  HENT: Negative.   Eyes: Negative.   Respiratory: Positive for cough (lingering).   Cardiovascular: Negative.   Gastrointestinal: Negative.   Endocrine: Negative.   Genitourinary: Negative.   Musculoskeletal: Negative.   Skin: Negative.   Allergic/Immunologic: Negative.   Neurological: Negative.   Hematological: Negative.   Psychiatric/Behavioral: Negative.        Objective:   Physical Exam  Constitutional: He is oriented to person, place, and time. He appears well-developed and well-nourished.  HENT:  Head: Normocephalic and atraumatic.  Right Ear: External ear normal.  Left Ear: External ear normal.  Mouth/Throat: Oropharynx is clear and moist. No oropharyngeal exudate.  Nasal congestion bilaterally with turbinate swelling  Eyes: Conjunctivae and EOM are normal. Pupils are equal, round, and reactive to light. Right eye exhibits no discharge. Left eye exhibits no discharge. No scleral icterus.  Neck: Normal range of motion. Neck supple. No thyromegaly present.  No thyromegaly anterior cervical nodes or carotid bruits  Cardiovascular: Normal rate, regular rhythm, normal heart sounds and intact distal pulses.   No murmur heard. The heart has a regular rate and rhythm at 72/m  Pulmonary/Chest: Effort normal and breath sounds normal. No respiratory distress. He has no wheezes. He has no rales. He exhibits no  tenderness.  The lungs were clear anteriorly and posteriorly but with coughing there was slight increased congestion left greater than right. There are no rales.  Abdominal: Soft. Bowel sounds are normal. He exhibits no mass. There is no tenderness. There is no rebound and no guarding.  No abdominal tenderness.  Genitourinary:  This is followed by Dr. Jeffie Pollock  yearly.  Musculoskeletal:  Normal range of motion. He exhibits no edema or tenderness.  Good mobility without joint pain.  Lymphadenopathy:    He has no cervical adenopathy.  Neurological: He is alert and oriented to person, place, and time. He has normal reflexes. No cranial nerve deficit.  Skin: Skin is warm and dry. No rash noted. No erythema. No pallor.  Psychiatric: He has a normal mood and affect. His behavior is normal. Judgment and thought content normal.  Nursing note and vitals reviewed.  BP 153/91 mmHg  Pulse 87  Temp(Src) 96.7 F (35.9 C) (Oral)  Ht 5' 7.5" (1.715 m)  Wt 205 lb (92.987 kg)  BMI 31.62 kg/m2  Repeat blood pressure was 130/88 in the left arm sitting with a large cuff.      Assessment & Plan:  1. Hyperlipidemia -The patient should continue with his pravastatin and aggressive therapeutic lifestyle changes. - POCT CBC - NMR, lipoprofile  2. Chronic kidney disease (CKD), stage IV (severe) -The patient sees the nephrologist yearly and November and he should continue to practice good habits as far as watching his sodium intake and doing all that he can to keep his blood pressure under good control. He should continue with his current medication. - POCT CBC - BMP8+EGFR  3. Essential hypertension -The blood pressure reading was improved today at 130/88 with a second reading. His blood pressures at home are running good in the 120s over the 70s and he should continue with his current treatment. - POCT CBC - BMP8+EGFR - Hepatic function panel  4. Vitamin D deficiency -He should continue with his current vitamin D dose pending results of lab work and kidney function test - POCT CBC - Vit D  25 hydroxy (rtn osteoporosis monitoring)  5. Metabolic syndrome -He should continue to work on his diet aggressively with exercise. - POCT CBC - BMP8+EGFR  6. History of prostate cancer -Patient is doing well with this and is being followed by the urologist, Dr. Jeffie Pollock yearly. - POCT CBC  7.  Other fatigue -We will check the lab work today for any reasons for his increased fatigue. - POCT CBC - Thyroid Panel With TSH  8. Cough -The patient was encouraged to drink plenty of fluids and take Mucinex over-the-counter for cough and congestion - POCT CBC  9. Benign hypertension with chronic kidney disease, stage III -He will continue to manage his blood pressure as well as possible and keep follow-ups with the nephrologist.  10. Allergic rhinitis due to pollen -Nasal saline and Flonase over-the-counter 1 spray each nostril  Patient Instructions                       Medicare Annual Wellness Visit  [facility] and the medical providers at [facility] strive to bring you the best medical care.  In doing so we not only want to address your current medical conditions and concerns but also to detect new conditions early and prevent illness, disease and health-related problems.    Medicare offers a yearly Wellness Visit which allows our clinical staff to assess your need  for preventative services including immunizations, lifestyle education, counseling to decrease risk of preventable diseases and screening for fall risk and other medical concerns.    This visit is provided free of charge (no copay) for all Medicare recipients. The clinical pharmacists at [facility] have begun to conduct these Wellness Visits which will also include a thorough review of all your medications.    As you primary medical provider recommend that you make an appointment for your Annual Wellness Visit if you have not done so already this year.  You may set up this appointment before you leave today or you may call back ([phone]) and schedule an appointment.  Please make sure when you call that you mention that you are scheduling your Annual Wellness Visit with the clinical pharmacist so that the appointment may be made for the proper length of time.     Continue  current medications. Continue good therapeutic lifestyle changes which include good diet and exercise. Fall precautions discussed with patient. If an FOBT was given today- please return it to our front desk. If you are over 45 years old - you may need Prevnar 37 or the adult Pneumonia vaccine.  Flu Shots are still available at our office. If you still haven't had one please call to set up a nurse visit to get one.   After your visit with [place] today you will receive a survey in the mail or online from [employer] regarding your care with [place]. Please take a moment to fill this out. Your feedback is very important to [place] as you can help [place] better understand your patient needs as well as improve your experience and satisfaction. WE CARE ABOUT YOU!!!   Continue to record blood pressure readings at home and bring these in for review with each visit Continue to avoid salt intake as much as possible You can purchase Flonase, over-the-counter and use 1-2 sprays each nostril at bedtime You can also purchase Mucinex, blue and white in color, over-the-counter, 1 twice daily with a large glass of water for cough and congestion Continue to drink plenty of fluids If the cough does not get better, please return to the office for another visit.   Arrie Senate MD

## 2015-03-18 NOTE — Patient Instructions (Addendum)
Medicare Annual Wellness Visit  [facility] and the medical providers at [facility] strive to bring you the best medical care.  In doing so we not only want to address your current medical conditions and concerns but also to detect new conditions early and prevent illness, disease and health-related problems.    Medicare offers a yearly Wellness Visit which allows our clinical staff to assess your need for preventative services including immunizations, lifestyle education, counseling to decrease risk of preventable diseases and screening for fall risk and other medical concerns.    This visit is provided free of charge (no copay) for all Medicare recipients. The clinical pharmacists at [facility] have begun to conduct these Wellness Visits which will also include a thorough review of all your medications.    As you primary medical provider recommend that you make an appointment for your Annual Wellness Visit if you have not done so already this year.  You may set up this appointment before you leave today or you may call back [phone] and schedule an appointment.  Please make sure when you call that you mention that you are scheduling your Annual Wellness Visit with the clinical pharmacist so that the appointment may be made for the proper length of time.     Continue current medications. Continue good therapeutic lifestyle changes which include good diet and exercise. Fall precautions discussed with patient. If an FOBT was given today- please return it to our front desk. If you are over 66 years old - you may need Prevnar 13 or the adult Pneumonia vaccine.  Flu Shots are still available at our office. If you still haven't had one please call to set up a nurse visit to get one.   After your visit with [place] today you will receive a survey in the mail or online from [employer] regarding your care with [place]. Please take a moment to  fill this out. Your feedback is very important to [place] as you can help [place] better understand your patient needs as well as improve your experience and satisfaction. WE CARE ABOUT YOU!!!   Continue to record blood pressure readings at home and bring these in for review with each visit Continue to avoid salt intake as much as possible You can purchase Flonase, over-the-counter and use 1-2 sprays each nostril at bedtime You can also purchase Mucinex, blue and white in color, over-the-counter, 1 twice daily with a large glass of water for cough and congestion Continue to drink plenty of fluids If the cough does not get better, please return to the office for another visit.

## 2015-03-20 LAB — BMP8+EGFR
BUN/Creatinine Ratio: 18 (ref 10–22)
BUN: 34 mg/dL — ABNORMAL HIGH (ref 8–27)
CO2: 21 mmol/L (ref 18–29)
Calcium: 10.1 mg/dL (ref 8.6–10.2)
Chloride: 104 mmol/L (ref 97–108)
Creatinine, Ser: 1.88 mg/dL — ABNORMAL HIGH (ref 0.76–1.27)
GFR calc Af Amer: 39 mL/min/{1.73_m2} — ABNORMAL LOW (ref >59)
GFR calc non Af Amer: 34 mL/min/{1.73_m2} — ABNORMAL LOW (ref >59)
Glucose: 101 mg/dL — ABNORMAL HIGH (ref 65–99)
POTASSIUM: 4.8 mmol/L (ref 3.5–5.2)
SODIUM: 143 mmol/L (ref 134–144)

## 2015-03-20 LAB — NMR, LIPOPROFILE
Cholesterol: 139 mg/dL (ref 100–199)
HDL Cholesterol by NMR: 49 mg/dL (ref >39)
HDL Particle Number: 33 umol/L (ref >=30.5)
LDL Particle Number: 757 nmol/L (ref <1000)
LDL Size: 20.4 nm (ref >20.5)
LDL-C: 72 mg/dL (ref 0–99)
LP-IR Score: 62 — ABNORMAL HIGH (ref <=45)
Small LDL Particle Number: 404 nmol/L (ref <=527)
Triglycerides by NMR: 91 mg/dL (ref 0–149)

## 2015-03-20 LAB — HEPATIC FUNCTION PANEL
ALT: 22 IU/L (ref 0–44)
AST: 28 IU/L (ref 0–40)
Albumin: 4.6 g/dL (ref 3.5–4.8)
Alkaline Phosphatase: 52 IU/L (ref 39–117)
BILIRUBIN, DIRECT: 0.2 mg/dL (ref 0.00–0.40)
Bilirubin Total: 0.6 mg/dL (ref 0.0–1.2)
Total Protein: 7.6 g/dL (ref 6.0–8.5)

## 2015-03-20 LAB — THYROID PANEL WITH TSH
Free Thyroxine Index: 2.2 (ref 1.2–4.9)
T3 UPTAKE RATIO: 26 % (ref 24–39)
T4 TOTAL: 8.3 ug/dL (ref 4.5–12.0)
TSH: 4.18 u[IU]/mL (ref 0.450–4.500)

## 2015-03-20 LAB — VITAMIN D 25 HYDROXY (VIT D DEFICIENCY, FRACTURES): Vit D, 25-Hydroxy: 31 ng/mL (ref 30.0–100.0)

## 2015-05-06 ENCOUNTER — Encounter: Payer: Self-pay | Admitting: Physician Assistant

## 2015-05-06 ENCOUNTER — Ambulatory Visit (INDEPENDENT_AMBULATORY_CARE_PROVIDER_SITE_OTHER): Payer: Medicare Other | Admitting: Physician Assistant

## 2015-05-06 VITALS — BP 148/75 | HR 93 | Temp 97.2°F | Ht 67.5 in | Wt 208.0 lb

## 2015-05-06 DIAGNOSIS — W57XXXA Bitten or stung by nonvenomous insect and other nonvenomous arthropods, initial encounter: Secondary | ICD-10-CM

## 2015-05-06 DIAGNOSIS — S30861A Insect bite (nonvenomous) of abdominal wall, initial encounter: Secondary | ICD-10-CM | POA: Diagnosis not present

## 2015-05-06 MED ORDER — DOXYCYCLINE HYCLATE 100 MG PO TABS: 100.0000 mg | ORAL_TABLET | Freq: Two times a day (BID) | ORAL | Status: DC

## 2015-05-06 MED ORDER — TRIAMCINOLONE ACETONIDE 0.1 % EX CREA: 1.0000 "application " | TOPICAL_CREAM | Freq: Two times a day (BID) | CUTANEOUS | Status: DC

## 2015-05-06 NOTE — Progress Notes (Signed)
   Subjective:    [name], male    DOB: Apr 26, 1936, 79 y.o.   MRN: [id number]  HPI 79 y/o male presents after getting a tick bite 2 days ago. He continues to have itching and redness where tick was attached.     Review of Systems  Constitutional: Negative for fever, chills, diaphoresis and fatigue.  HENT: Negative.   Respiratory: Negative for shortness of breath.   Gastrointestinal: Negative for nausea and vomiting.  Musculoskeletal: Negative for arthralgias.  Skin: Positive for color change (redness, swelling where tick was attached. ). Negative for rash.       Objective:   Physical Exam  Constitutional: He is oriented to person, place, and time. He appears well-developed and well-nourished. No distress.  Musculoskeletal: He exhibits edema. He exhibits no tenderness.  Neurological: He is alert and oriented to person, place, and time.  Skin: No rash noted. He is not diaphoretic. There is erythema (inflammation and erythematous lesion with central bite inferior to umbilicus).  Psychiatric: He has a normal mood and affect. His behavior is normal. Judgment and thought content normal.  Nursing note and vitals reviewed.         Assessment & Plan:  1. Tick bite of abdomen, initial encounter  - doxycycline (VIBRA-TABS) 100 MG tablet; Take 1 tablet (100 mg total) by mouth 2 (two) times daily.  Dispense: 28 tablet; Refill: 0 - triamcinolone cream (KENALOG) 0.1 %; Apply 1 application topically 2 (two) times daily.  Dispense: 30 g; Refill: 0 - Zyrtec 10mg  daily if needed for itch    RTO prn   Yanil Dawe A. Benjamin Stain PA-C

## 2015-05-06 NOTE — Patient Instructions (Signed)
  Can take over the counter Zyrtec for itch relief- may want to take at night if drowsiness occurs.   Tick Bite Information Ticks are insects that attach themselves to the skin. There are many types of ticks. Common types include wood ticks and deer ticks. Sometimes, ticks carry diseases that can make a person very ill. The most common places for ticks to attach themselves are the scalp, neck, armpits, waist, and groin.  HOW CAN YOU PREVENT TICK BITES? Take these steps to help prevent tick bites when you are outdoors:  Wear long sleeves and long pants.  Wear white clothes so you can see ticks more easily.  Tuck your pant legs into your socks.  If walking on a trail, stay in the middle of the trail to avoid brushing against bushes.  Avoid walking through areas with long grass.  Put bug spray on all skin that is showing and along boot tops, pant legs, and sleeve cuffs.  Check clothes, hair, and skin often and before going inside.  Brush off any ticks that are not attached.  Take a shower or bath as soon as possible after being outdoors. HOW SHOULD YOU REMOVE A TICK? Ticks should be removed as soon as possible to help prevent diseases. 1. If latex gloves are available, put them on before trying to remove a tick. 2. Use tweezers to grasp the tick as close to the skin as possible. You may also use curved forceps or a tick removal tool. Grasp the tick as close to its head as possible. Avoid grasping the tick on its body. 3. Pull gently upward until the tick lets go. Do not twist the tick or jerk it suddenly. This may break off the tick's head or mouth parts. 4. Do not squeeze or crush the tick's body. This could force disease-carrying fluids from the tick into your body. 5. After the tick is removed, wash the bite area and your hands with soap and water or alcohol. 6. Apply a small amount of antiseptic cream or ointment to the bite site. 7. Wash any tools that were used. Do not try to  remove a tick by applying a hot match, petroleum jelly, or fingernail polish to the tick. These methods do not work. They may also increase the chances of disease being spread from the tick bite. WHEN SHOULD YOU SEEK HELP? Contact your health care provider if you are unable to remove a tick or if a part of the tick breaks off in the skin. After a tick bite, you need to watch for signs and symptoms of diseases that can be spread by ticks. Contact your health care provider if you develop any of the following:  Fever.  Rash.  Redness and puffiness (swelling) in the area of the tick bite.  Tender, puffy lymph glands.  Watery poop (diarrhea).  Weight loss.  Cough.  Feeling more tired than normal (fatigue).  Muscle, joint, or bone pain.  Belly (abdominal) pain.  Headache.  Change in your level of consciousness.  Trouble walking or moving your legs.  Loss of feeling (numbness) in the legs.  Loss of movement (paralysis).  Shortness of breath.  Confusion.  Throwing up (vomiting) many times. Document Released: 03/07/2010 Document Revised: 08/13/2013 Document Reviewed: 05/21/2013 [facility] Patient Information 2015 [place], [place]. This information is not intended to replace advice given to you by your health care provider. Make sure you discuss any questions you have with your health care provider.

## 2015-07-02 ENCOUNTER — Encounter: Payer: Self-pay | Admitting: Family Medicine

## 2015-07-02 ENCOUNTER — Ambulatory Visit (INDEPENDENT_AMBULATORY_CARE_PROVIDER_SITE_OTHER): Payer: Medicare Other | Admitting: Family Medicine

## 2015-07-02 ENCOUNTER — Encounter (INDEPENDENT_AMBULATORY_CARE_PROVIDER_SITE_OTHER): Payer: Self-pay

## 2015-07-02 VITALS — BP 143/79 | HR 80 | Temp 97.5°F | Ht 67.5 in | Wt 202.0 lb

## 2015-07-02 DIAGNOSIS — I10 Essential (primary) hypertension: Secondary | ICD-10-CM

## 2015-07-02 DIAGNOSIS — I129 Hypertensive chronic kidney disease with stage 1 through stage 4 chronic kidney disease, or unspecified chronic kidney disease: Secondary | ICD-10-CM

## 2015-07-02 DIAGNOSIS — N183 Chronic kidney disease, stage 3 unspecified: Secondary | ICD-10-CM

## 2015-07-02 DIAGNOSIS — C61 Malignant neoplasm of prostate: Secondary | ICD-10-CM

## 2015-07-02 DIAGNOSIS — E8881 Metabolic syndrome: Secondary | ICD-10-CM | POA: Diagnosis not present

## 2015-07-02 DIAGNOSIS — N184 Chronic kidney disease, stage 4 (severe): Secondary | ICD-10-CM | POA: Diagnosis not present

## 2015-07-02 DIAGNOSIS — E785 Hyperlipidemia, unspecified: Secondary | ICD-10-CM

## 2015-07-02 DIAGNOSIS — E559 Vitamin D deficiency, unspecified: Secondary | ICD-10-CM | POA: Diagnosis not present

## 2015-07-02 LAB — POCT CBC
Granulocyte percent: 68 %G (ref 37–80)
HCT, POC: 46.9 % (ref 43.5–53.7)
HEMOGLOBIN: 15.1 g/dL (ref 14.1–18.1)
Lymph, poc: 2.2 (ref 0.6–3.4)
MCH: 27.8 pg (ref 27–31.2)
MCHC: 32.2 g/dL (ref 31.8–35.4)
MCV: 86.2 fL (ref 80–97)
MPV: 7 fL (ref 0–99.8)
POC Granulocyte: 6.1 (ref 2–6.9)
POC LYMPH %: 24.5 % (ref 10–50)
Platelet Count, POC: 318 10*3/uL (ref 142–424)
RBC: 5.45 M/uL (ref 4.69–6.13)
RDW, POC: 13.8 %
WBC: 9 10*3/uL (ref 4.6–10.2)

## 2015-07-02 NOTE — Patient Instructions (Addendum)
Medicare Annual Wellness Visit  [facility] and the medical providers at [facility] strive to bring you the best medical care.  In doing so we not only want to address your current medical conditions and concerns but also to detect new conditions early and prevent illness, disease and health-related problems.    Medicare offers a yearly Wellness Visit which allows our clinical staff to assess your need for preventative services including immunizations, lifestyle education, counseling to decrease risk of preventable diseases and screening for fall risk and other medical concerns.    This visit is provided free of charge (no copay) for all Medicare recipients. The clinical pharmacists at [facility] have begun to conduct these Wellness Visits which will also include a thorough review of all your medications.    As you primary medical provider recommend that you make an appointment for your Annual Wellness Visit if you have not done so already this year.  You may set up this appointment before you leave today or you may call back [phone] and schedule an appointment.  Please make sure when you call that you mention that you are scheduling your Annual Wellness Visit with the clinical pharmacist so that the appointment may be made for the proper length of time.     Continue current medications. Continue good therapeutic lifestyle changes which include good diet and exercise. Fall precautions discussed with patient. If an FOBT was given today- please return it to our front desk. If you are over 56 years old - you may need Prevnar 67 or the adult Pneumonia vaccine.  Flu Shots are still available at our office. If you still haven't had one please call to set up a nurse visit to get one.   After your visit with [place] today you will receive a survey in the mail or online from [employer] regarding your care with [place]. Please take a moment to  fill this out. Your feedback is very important to [place] as you can help [place] better understand your patient needs as well as improve your experience and satisfaction. WE CARE ABOUT YOU!!!    follow-up with urology yearly as planned  This summer drink plenty of fluids   continue to monitor blood pressures

## 2015-07-02 NOTE — Progress Notes (Signed)
Subjective:    [name], male    DOB: Feb 11, 1936, 79 y.o.   MRN: [id number]  HPI Pt here for follow up and management of chronic medical problems which includes hypertension and hyperlipidemia. He is taking medications regularly. Agent is doing well with no specific complaints. He is concerned about a lesion on his scalp.  She denies chest pain shortness of breath trouble swallowing GI symptoms blood in the stool or black tarry black bowel movements. He has a history of having seed implants in his prostate gland in 2009 any continues to follow-up regularly with the urologist on a yearly basis. He is having no problems with voiding. He has a positive     demeanor and stays active. He brings in blood pressures for review and all of these are excellent and under 140 except for 2 readings over 4 months.      Patient Active Problem List   Diagnosis Date Noted  . Benign hypertension with chronic kidney disease, stage III 03/18/2015  . Allergic rhinitis due to pollen 03/18/2015  . Metabolic syndrome 29/24/4628  . Hypertension 05/28/2013  . Hyperlipidemia 05/28/2013  . Vitamin D deficiency 05/28/2013  . Chronic kidney disease (CKD), stage IV (severe) 05/28/2013  . History of prostate cancer 05/28/2013  . Gout 05/28/2013   Outpatient Encounter Prescriptions as of 07/02/2015  Medication Sig  . allopurinol (ZYLOPRIM) 100 MG tablet Take 1 tablet by mouth  daily  . amLODipine (NORVASC) 5 MG tablet Take 1 and 1/2 tablets by  mouth every day  . aspirin EC 81 MG tablet Take 81 mg by mouth daily.  . cholecalciferol (VITAMIN D) 1000 UNITS tablet Take 1,000 Units by mouth daily.  . clotrimazole-betamethasone (LOTRISONE) cream Apply topically 2 (two) times daily.  . fenofibrate 160 MG tablet TAKE ONE TABLET BY MOUTH ONCE DAILY  . fish oil-omega-3 fatty acids 1000 MG capsule Take 1 g by mouth 2 (two) times daily.  Marland Kitchen levothyroxine (SYNTHROID, LEVOTHROID) 50 MCG tablet Take 1 tablet by  mouth  daily  . losartan-hydrochlorothiazide (HYZAAR) 100-25 MG per tablet Take 1 tablet by mouth daily.  . pravastatin (PRAVACHOL) 80 MG tablet TAKE ONE TABLET BY MOUTH ONCE DAILY  . triamcinolone cream (KENALOG) 0.1 % Apply 1 application topically 2 (two) times daily.  . vitamin C (ASCORBIC ACID) 500 MG tablet Take 500 mg by mouth daily.  . [DISCONTINUED] doxycycline (VIBRA-TABS) 100 MG tablet Take 1 tablet (100 mg total) by mouth 2 (two) times daily.   No facility-administered encounter medications on file as of 07/02/2015.     Review of Systems  Constitutional: Negative.   HENT: Negative.   Eyes: Negative.   Respiratory: Negative.   Cardiovascular: Negative.   Gastrointestinal: Negative.   Endocrine: Negative.   Genitourinary: Negative.   Musculoskeletal: Negative.   Skin: Negative.        Skin lesion in scalp  Allergic/Immunologic: Negative.   Neurological: Negative.   Hematological: Negative.   Psychiatric/Behavioral: Negative.        Objective:   Physical Exam  Constitutional: He is oriented to person, place, and time. He appears well-developed and well-nourished. No distress.  The patient is pleasant and alert and looks much younger than his stated age  HENT:  Head: Normocephalic and atraumatic.  Right Ear: External ear normal.  Left Ear: External ear normal.  Nose: Nose normal.  Mouth/Throat: Oropharynx is clear and moist. No oropharyngeal exudate.  Eyes: Conjunctivae and EOM are normal. Pupils are equal,  round, and reactive to light. Right eye exhibits no discharge. Left eye exhibits no discharge. No scleral icterus.  Neck: Normal range of motion. Neck supple. No thyromegaly present.  No carotid bruits anterior cervical adenopathy or thyromegaly  Cardiovascular: Normal rate, regular rhythm, normal heart sounds and intact distal pulses.  Exam reveals no gallop and no friction rub.   No murmur heard. At 72/m  Pulmonary/Chest: Effort normal and breath sounds normal.  No respiratory distress. He has no wheezes. He has no rales. He exhibits no tenderness.  The chest is clear anteriorly and posteriorly and there is no axillary adenopathy and the chest wall has no masses  Abdominal: Soft. Bowel sounds are normal. He exhibits no mass. There is no tenderness. There is no rebound and no guarding.  There are no inguinal nodes and there is a healing tick bite at the waistline.  Musculoskeletal: Normal range of motion. He exhibits no edema or tenderness.  Lymphadenopathy:    He has no cervical adenopathy.  Neurological: He is alert and oriented to person, place, and time. He has normal reflexes. No cranial nerve deficit.  Skin: Skin is warm and dry. No rash noted. No erythema. No pallor.  There is an actinic appearing keratosis in the scalp that is irritating especially when combing his hair etc. We will arrange for him to have this removed and biopsied.  Psychiatric: He has a normal mood and affect. His behavior is normal. Judgment and thought content normal.  Nursing note and vitals reviewed.  BP 143/79 mmHg  Pulse 80  Temp(Src) 97.5 F (36.4 C) (Oral)  Ht 5' 7.5" (1.715 m)  Wt 202 lb (91.627 kg)  BMI 31.15 kg/m2        Assessment & Plan:  1. Hyperlipidemia -Continue current treatment pending results of lab work - POCT CBC - NMR, lipoprofile  2. Chronic kidney disease (CKD), stage IV (severe) -Continue to avoid NSAIDs and we will monitor this with the lab work being done today. - POCT CBC - BMP8+EGFR  3. Essential hypertension -Home blood pressure readings are good and will be no change in treatment - POCT CBC - BMP8+EGFR - Hepatic function panel  4. Vitamin D deficiency -Continue current treatment pending results of lab work - POCT CBC - Vit D  25 hydroxy (rtn osteoporosis monitoring)  5. Metabolic syndrome -Continue with exercise regimen and diet habits to keep weight down as low as possible - POCT CBC - BMP8+EGFR  6. Benign  hypertension with chronic kidney disease, stage III -The patient has been monitoring his blood pressures regularly and he will continue to do this and watch NSAID intake and restrict sodium from his diet - POCT CBC - BMP8+EGFR - Hepatic function panel  7. Prostate cancer -Continue follow-up with urology  Patient Instructions                       Medicare Annual Wellness Visit  [facility] and the medical providers at [facility] strive to bring you the best medical care.  In doing so we not only want to address your current medical conditions and concerns but also to detect new conditions early and prevent illness, disease and health-related problems.    Medicare offers a yearly Wellness Visit which allows our clinical staff to assess your need for preventative services including immunizations, lifestyle education, counseling to decrease risk of preventable diseases and screening for fall risk and other medical concerns.    This  visit is provided free of charge (no copay) for all Medicare recipients. The clinical pharmacists at [facility] have begun to conduct these Wellness Visits which will also include a thorough review of all your medications.    As you primary medical provider recommend that you make an appointment for your Annual Wellness Visit if you have not done so already this year.  You may set up this appointment before you leave today or you may call back ([phone]) and schedule an appointment.  Please make sure when you call that you mention that you are scheduling your Annual Wellness Visit with the clinical pharmacist so that the appointment may be made for the proper length of time.     Continue current medications. Continue good therapeutic lifestyle changes which include good diet and exercise. Fall precautions discussed with patient. If an FOBT was given today- please return it to our front desk. If you are over 54 years old  - you may need Prevnar 69 or the adult Pneumonia vaccine.  Flu Shots are still available at our office. If you still haven't had one please call to set up a nurse visit to get one.   After your visit with [place] today you will receive a survey in the mail or online from [employer] regarding your care with [place]. Please take a moment to fill this out. Your feedback is very important to [place] as you can help [place] better understand your patient needs as well as improve your experience and satisfaction. WE CARE ABOUT YOU!!!    follow-up with urology yearly as planned  This summer drink plenty of fluids   continue to monitor blood pressures   Arrie Senate MD

## 2015-07-03 LAB — BMP8+EGFR
BUN / CREAT RATIO: 14 (ref 10–22)
BUN: 27 mg/dL (ref 8–27)
CHLORIDE: 103 mmol/L (ref 97–108)
CO2: 22 mmol/L (ref 18–29)
CREATININE: 1.93 mg/dL — AB (ref 0.76–1.27)
Calcium: 10.2 mg/dL (ref 8.6–10.2)
GFR calc Af Amer: 38 mL/min/{1.73_m2} — ABNORMAL LOW (ref >59)
GFR, EST NON AFRICAN AMERICAN: 33 mL/min/{1.73_m2} — AB (ref >59)
GLUCOSE: 104 mg/dL — AB (ref 65–99)
Potassium: 4.8 mmol/L (ref 3.5–5.2)
Sodium: 142 mmol/L (ref 134–144)

## 2015-07-03 LAB — NMR, LIPOPROFILE
Cholesterol: 121 mg/dL (ref 100–199)
HDL Cholesterol by NMR: 48 mg/dL (ref >39)
HDL PARTICLE NUMBER: 37.2 umol/L (ref >=30.5)
LDL Particle Number: 695 nmol/L (ref <1000)
LDL SIZE: 20.7 nm (ref >20.5)
LDL-C: 58 mg/dL (ref 0–99)
LP-IR Score: 54 — ABNORMAL HIGH (ref <=45)
SMALL LDL PARTICLE NUMBER: 353 nmol/L (ref <=527)
TRIGLYCERIDES BY NMR: 75 mg/dL (ref 0–149)

## 2015-07-03 LAB — HEPATIC FUNCTION PANEL
ALK PHOS: 57 IU/L (ref 39–117)
ALT: 23 IU/L (ref 0–44)
AST: 31 IU/L (ref 0–40)
Albumin: 4.5 g/dL (ref 3.5–4.8)
BILIRUBIN, DIRECT: 0.21 mg/dL (ref 0.00–0.40)
Bilirubin Total: 0.6 mg/dL (ref 0.0–1.2)
TOTAL PROTEIN: 7.3 g/dL (ref 6.0–8.5)

## 2015-07-03 LAB — VITAMIN D 25 HYDROXY (VIT D DEFICIENCY, FRACTURES): Vit D, 25-Hydroxy: 39.8 ng/mL (ref 30.0–100.0)

## 2015-07-06 ENCOUNTER — Encounter: Payer: Self-pay | Admitting: Physician Assistant

## 2015-07-06 ENCOUNTER — Ambulatory Visit (INDEPENDENT_AMBULATORY_CARE_PROVIDER_SITE_OTHER): Payer: Medicare Other | Admitting: Physician Assistant

## 2015-07-06 VITALS — BP 109/59 | HR 80 | Temp 98.6°F | Ht 67.5 in | Wt 206.0 lb

## 2015-07-06 DIAGNOSIS — L570 Actinic keratosis: Secondary | ICD-10-CM

## 2015-07-06 NOTE — Progress Notes (Signed)
   Subjective:    Patient ID: ARLES LARSSON, male    DOB: 22-Sep-1936, 79 y.o.   MRN: [id number]  HPI 79 y/o male presents with reoccurring lesion on scalp. He states that it will crust up and he peels it off. It has been tender lately when he tried to peel it off. Fair skin   Review of Systems  Skin:       Scaling lesion on right scalp and back of neck. Lesion on scalp ttp due to picking at it.        Objective:   Physical Exam  Skin:  Hyperkeratotic lesions and severe actinic changes on right scalp, bilateral external auricles, BUE and posterior neck. Surround erythema  Psychiatric: He has a normal mood and affect. His behavior is normal. Judgment and thought content normal.          Assessment & Plan:  1. Actinic keratosis - Areas of actinic changes on face, scalp, posterior neck and BUE treated with cryosurgery x 16. I will follow up with patient in 2 months for recheck. If lesions have recurred, I will biopsy at that time.   RTC 2 months  Ziyan Schoon A. Benjamin Stain PA-C

## 2015-07-31 ENCOUNTER — Other Ambulatory Visit: Payer: Self-pay | Admitting: Family Medicine

## 2015-09-06 ENCOUNTER — Ambulatory Visit (INDEPENDENT_AMBULATORY_CARE_PROVIDER_SITE_OTHER): Payer: Medicare Other | Admitting: Physician Assistant

## 2015-09-06 ENCOUNTER — Encounter: Payer: Self-pay | Admitting: Physician Assistant

## 2015-09-06 VITALS — BP 153/89 | HR 88 | Temp 97.5°F | Ht 67.5 in | Wt 209.0 lb

## 2015-09-06 DIAGNOSIS — L570 Actinic keratosis: Secondary | ICD-10-CM

## 2015-09-23 NOTE — Progress Notes (Signed)
[name], male   DOB: Feb 10, 1936, 79 y.o.   MRN: [id number]  79 y/o male presents for follow up of areas of actinic keratoses on his scalp and face that were treated with cryosurgery. He feels that the lesions have healed well and he has not had any problems with scaling.   PE reveals decreased areas of actinic changes on face. Appear to be healing well from prior cryo treatment. I do not feel that biopsy to r/o BCC/SCC is an issue.   I have encouraged patient to wear 50+ spf as well as follow up on a yearly basis for skin checks. No further treatment required at this time.   Shulamit Donofrio A. Benjamin Stain PA-C

## 2015-10-08 DIAGNOSIS — H52203 Unspecified astigmatism, bilateral: Secondary | ICD-10-CM | POA: Diagnosis not present

## 2015-10-08 DIAGNOSIS — H524 Presbyopia: Secondary | ICD-10-CM | POA: Diagnosis not present

## 2015-10-08 DIAGNOSIS — H5203 Hypermetropia, bilateral: Secondary | ICD-10-CM | POA: Diagnosis not present

## 2015-10-08 DIAGNOSIS — H35372 Puckering of macula, left eye: Secondary | ICD-10-CM | POA: Diagnosis not present

## 2015-10-08 DIAGNOSIS — H2513 Age-related nuclear cataract, bilateral: Secondary | ICD-10-CM | POA: Diagnosis not present

## 2015-11-09 ENCOUNTER — Encounter: Payer: Self-pay | Admitting: Family Medicine

## 2015-11-09 ENCOUNTER — Ambulatory Visit (INDEPENDENT_AMBULATORY_CARE_PROVIDER_SITE_OTHER): Payer: Medicare Other | Admitting: Family Medicine

## 2015-11-09 VITALS — BP 122/84 | HR 77 | Temp 97.0°F | Ht 67.0 in | Wt 207.0 lb

## 2015-11-09 DIAGNOSIS — N184 Chronic kidney disease, stage 4 (severe): Secondary | ICD-10-CM

## 2015-11-09 DIAGNOSIS — I10 Essential (primary) hypertension: Secondary | ICD-10-CM

## 2015-11-09 DIAGNOSIS — Z23 Encounter for immunization: Secondary | ICD-10-CM

## 2015-11-09 DIAGNOSIS — E559 Vitamin D deficiency, unspecified: Secondary | ICD-10-CM | POA: Diagnosis not present

## 2015-11-09 DIAGNOSIS — E785 Hyperlipidemia, unspecified: Secondary | ICD-10-CM | POA: Diagnosis not present

## 2015-11-09 NOTE — Progress Notes (Signed)
Subjective:    [name], male    DOB: 23-Jul-1936, 79 y.o.   MRN: [id number]  HPI Pt here for follow up and management of chronic medical problems of hypertension and hyperlipidemia, patient taking medications. The patient is doing well today with no specific complaints. He has chronic kidney disease stage IV and has hypertension and hyperlipidemia and vitamin D deficiency. He also has a history of gout and prostate cancer. The patient monitors the blood pressures at home and indicates easy been running in the 120s over the 70s to 80s routinely. He forgot to bring the readings in with him today to be scanned into the record. He does see Dr. Jacqualin Combes up as his nephrologist. He sees the cardiologist also and Dr. Jeffie Pollock. He denies chest pain shortness of breath trouble swallowing and heartburn and indigestion nausea vomiting diarrhea or blood in the stool. He is passing his water without problems. He did have C. difficile implants for prostate cancer in about 2009.  Patient Active Problem List   Diagnosis Date Noted  . Benign hypertension with chronic kidney disease, stage III 03/18/2015  . Allergic rhinitis due to pollen 03/18/2015  . Metabolic syndrome 18/56/3149  . Hypertension 05/28/2013  . Hyperlipidemia 05/28/2013  . Vitamin D deficiency 05/28/2013  . Chronic kidney disease (CKD), stage IV (severe) ([facility]) 05/28/2013  . History of prostate cancer 05/28/2013  . Gout 05/28/2013   Outpatient Encounter Prescriptions as of 11/09/2015  Medication Sig  . allopurinol (ZYLOPRIM) 100 MG tablet Take 1 tablet by mouth  daily  . amLODipine (NORVASC) 5 MG tablet Take 1 and 1/2 tablets by  mouth every day  . aspirin EC 81 MG tablet Take 81 mg by mouth daily.  . cholecalciferol (VITAMIN D) 1000 UNITS tablet Take 1,000 Units by mouth daily.  . clotrimazole-betamethasone (LOTRISONE) cream Apply topically 2 (two) times daily.  . fenofibrate 160 MG tablet TAKE ONE TABLET BY MOUTH ONCE DAILY  .  fish oil-omega-3 fatty acids 1000 MG capsule Take 1 g by mouth 2 (two) times daily.  Marland Kitchen levothyroxine (SYNTHROID, LEVOTHROID) 50 MCG tablet Take 1 tablet by mouth  daily  . losartan-hydrochlorothiazide (HYZAAR) 100-25 MG per tablet Take 1 tablet by mouth  daily  . pravastatin (PRAVACHOL) 80 MG tablet Take 1 tablet by mouth once a day  . triamcinolone cream (KENALOG) 0.1 % Apply 1 application topically 2 (two) times daily.  . vitamin C (ASCORBIC ACID) 500 MG tablet Take 500 mg by mouth daily.   No facility-administered encounter medications on file as of 11/09/2015.       Review of Systems  Constitutional: Negative.   HENT: Negative.   Eyes: Negative.   Respiratory: Negative.   Cardiovascular: Negative.   Gastrointestinal: Negative.   Endocrine: Negative.   Genitourinary: Negative.   Musculoskeletal: Negative.   Skin: Negative.   Allergic/Immunologic: Negative.   Neurological: Negative.   Hematological: Negative.   Psychiatric/Behavioral: Negative.        Objective:   Physical Exam  Constitutional: He is oriented to person, place, and time. He appears well-developed and well-nourished.  HENT:  Head: Normocephalic and atraumatic.  Right Ear: External ear normal.  Left Ear: External ear normal.  Mouth/Throat: Oropharynx is clear and moist. No oropharyngeal exudate.  Nasal congestion and turbinate swelling  Eyes: Conjunctivae and EOM are normal. Pupils are equal, round, and reactive to light. Right eye exhibits no discharge. Left eye exhibits no discharge. No scleral icterus.  He's had his eyes  examined in the past year.  Neck: Normal range of motion. Neck supple. No thyromegaly present.  Without bruits or thyromegaly  Cardiovascular: Normal rate, regular rhythm, normal heart sounds and intact distal pulses.   No murmur heard. No axillary adenopathy and the chest was clear anteriorly and posteriorly  Pulmonary/Chest: Effort normal and breath sounds normal. No respiratory  distress. He has no wheezes. He has no rales. He exhibits no tenderness.  Clear anteriorly and posteriorly  Abdominal: Soft. Bowel sounds are normal. He exhibits no mass. There is no tenderness. There is no rebound and no guarding.  No abdominal tenderness masses or organ enlargement or bruits no inguinal adenopathy  Genitourinary:  This exam is done regularly by the urologist, Dr. Jeffie Pollock  Musculoskeletal: Normal range of motion. He exhibits no edema.  Lymphadenopathy:    He has no cervical adenopathy.  Neurological: He is alert and oriented to person, place, and time. He has normal reflexes. No cranial nerve deficit.  Skin: Skin is warm and dry. No rash noted. No erythema.  Psychiatric: He has a normal mood and affect. His behavior is normal. Judgment and thought content normal.  Nursing note and vitals reviewed.  BP 155/81 mmHg  Pulse 77  Temp(Src) 97 F (36.1 C) (Oral)  Ht 5' 7"  (1.702 m)  Wt 207 lb (93.895 kg)  BMI 32.41 kg/m2  Repeat blood pressure 122/84 left arm sitting large cuff      Assessment & Plan:  1. Hyperlipidemia -Continue current treatment pending results of lab work - CBC with Differential/Platelet - NMR, lipoprofile  2. Chronic kidney disease (CKD), stage IV (severe) ([facility]) -Continue to follow-up with his nephrologist Dr. Justin Mend - CBC with Differential/Platelet  3. Vitamin D deficiency -Continue current vitamin D replacement pending results of lab work and at the discretion of his nephrologist. - CBC with Differential/Platelet - VITAMIN D 25 Hydroxy (Vit-D Deficiency, Fractures)  4. Essential hypertension -Repeat blood pressure was good today at 122/84 sitting right arm large cuff and he should continue with current treatment - BMP8+EGFR - CBC with Differential/Platelet - Hepatic function panel  Patient Instructions                       Medicare Annual Wellness Visit  [facility] and the medical providers at [facility] strive  to bring you the best medical care.  In doing so we not only want to address your current medical conditions and concerns but also to detect new conditions early and prevent illness, disease and health-related problems.    Medicare offers a yearly Wellness Visit which allows our clinical staff to assess your need for preventative services including immunizations, lifestyle education, counseling to decrease risk of preventable diseases and screening for fall risk and other medical concerns.    This visit is provided free of charge (no copay) for all Medicare recipients. The clinical pharmacists at [facility] have begun to conduct these Wellness Visits which will also include a thorough review of all your medications.    As you primary medical provider recommend that you make an appointment for your Annual Wellness Visit if you have not done so already this year.  You may set up this appointment before you leave today or you may call back [phone]) and schedule an appointment.  Please make sure when you call that you mention that you are scheduling your Annual Wellness Visit with the clinical pharmacist so that the appointment may be made  for the proper length of time.     Continue current medications. Continue good therapeutic lifestyle changes which include good diet and exercise. Fall precautions discussed with patient. If an FOBT was given today- please return it to our front desk. If you are over 19 years old - you may need Prevnar 3 or the adult Pneumonia vaccine.  **Flu shots are available--- please call and schedule a FLU-CLINIC appointment**  After your visit with [place] today you will receive a survey in the mail or online from [employer] regarding your care with [place]. Please take a moment to fill this out. Your feedback is very important to [place] as you can help [place] better understand your patient needs as well as improve your experience and satisfaction. WE CARE ABOUT  YOU!!!   The patient should continue to follow-up with his ophthalmologist and urologist and nephrologist. He should do all possible to keep his  blood pressure under good control and stay active physically and avoid NSAIDs and keep his weight down Take Tylenol as needed for back pain Monitor blood pressures at home and bring these readings to the next visit   Arrie Senate MD

## 2015-11-09 NOTE — Patient Instructions (Addendum)
Medicare Annual Wellness Visit  [facility] and the medical providers at [facility] strive to bring you the best medical care.  In doing so we not only want to address your current medical conditions and concerns but also to detect new conditions early and prevent illness, disease and health-related problems.    Medicare offers a yearly Wellness Visit which allows our clinical staff to assess your need for preventative services including immunizations, lifestyle education, counseling to decrease risk of preventable diseases and screening for fall risk and other medical concerns.    This visit is provided free of charge (no copay) for all Medicare recipients. The clinical pharmacists at [facility] have begun to conduct these Wellness Visits which will also include a thorough review of all your medications.    As you primary medical provider recommend that you make an appointment for your Annual Wellness Visit if you have not done so already this year.  You may set up this appointment before you leave today or you may call back [phone] and schedule an appointment.  Please make sure when you call that you mention that you are scheduling your Annual Wellness Visit with the clinical pharmacist so that the appointment may be made for the proper length of time.     Continue current medications. Continue good therapeutic lifestyle changes which include good diet and exercise. Fall precautions discussed with patient. If an FOBT was given today- please return it to our front desk. If you are over 53 years old - you may need Prevnar 24 or the adult Pneumonia vaccine.  **Flu shots are available--- please call and schedule a FLU-CLINIC appointment**  After your visit with [place] today you will receive a survey in the mail or online from [employer] regarding your care with [place]. Please take a moment to fill this out. Your feedback is very  important to [place] as you can help [place] better understand your patient needs as well as improve your experience and satisfaction. WE CARE ABOUT YOU!!!   The patient should continue to follow-up with his ophthalmologist and urologist and nephrologist. He should do all possible to keep his  blood pressure under good control and stay active physically and avoid NSAIDs and keep his weight down Take Tylenol as needed for back pain Monitor blood pressures at home and bring these readings to the next visit

## 2015-11-10 LAB — CBC WITH DIFFERENTIAL/PLATELET
Basophils Absolute: 0.1 10*3/uL (ref 0.0–0.2)
Basos: 1 %
EOS (ABSOLUTE): 0.2 10*3/uL (ref 0.0–0.4)
Eos: 3 %
HEMOGLOBIN: 14.6 g/dL (ref 12.6–17.7)
Hematocrit: 43.7 % (ref 37.5–51.0)
IMMATURE GRANULOCYTES: 0 %
Immature Grans (Abs): 0 10*3/uL (ref 0.0–0.1)
LYMPHS: 28 %
Lymphocytes Absolute: 2.4 10*3/uL (ref 0.7–3.1)
MCH: 28.9 pg (ref 26.6–33.0)
MCHC: 33.4 g/dL (ref 31.5–35.7)
MCV: 87 fL (ref 79–97)
MONOCYTES: 9 %
Monocytes Absolute: 0.8 10*3/uL (ref 0.1–0.9)
Neutrophils Absolute: 5.1 10*3/uL (ref 1.4–7.0)
Neutrophils: 59 %
Platelets: 312 10*3/uL (ref 150–379)
RBC: 5.05 x10E6/uL (ref 4.14–5.80)
RDW: 13.2 % (ref 12.3–15.4)
WBC: 8.5 10*3/uL (ref 3.4–10.8)

## 2015-11-10 LAB — BMP8+EGFR
BUN/Creatinine Ratio: 17 (ref 10–22)
BUN: 34 mg/dL — AB (ref 8–27)
CO2: 23 mmol/L (ref 18–29)
CREATININE: 1.95 mg/dL — AB (ref 0.76–1.27)
Calcium: 10.2 mg/dL (ref 8.6–10.2)
Chloride: 102 mmol/L (ref 97–106)
GFR calc Af Amer: 37 mL/min/{1.73_m2} — ABNORMAL LOW (ref >59)
GFR calc non Af Amer: 32 mL/min/{1.73_m2} — ABNORMAL LOW (ref >59)
GLUCOSE: 107 mg/dL — AB (ref 65–99)
Potassium: 5 mmol/L (ref 3.5–5.2)
Sodium: 141 mmol/L (ref 136–144)

## 2015-11-10 LAB — HEPATIC FUNCTION PANEL
ALT: 26 IU/L (ref 0–44)
AST: 33 IU/L (ref 0–40)
Albumin: 4.6 g/dL (ref 3.5–4.8)
Alkaline Phosphatase: 52 IU/L (ref 39–117)
BILIRUBIN TOTAL: 0.6 mg/dL (ref 0.0–1.2)
BILIRUBIN, DIRECT: 0.19 mg/dL (ref 0.00–0.40)
Total Protein: 7.5 g/dL (ref 6.0–8.5)

## 2015-11-10 LAB — VITAMIN D 25 HYDROXY (VIT D DEFICIENCY, FRACTURES): VIT D 25 HYDROXY: 37.7 ng/mL (ref 30.0–100.0)

## 2015-11-10 LAB — NMR, LIPOPROFILE
CHOLESTEROL: 132 mg/dL (ref 100–199)
HDL CHOLESTEROL BY NMR: 53 mg/dL (ref >39)
HDL Particle Number: 36 umol/L (ref >=30.5)
LDL Particle Number: 830 nmol/L (ref <1000)
LDL Size: 20.5 nm (ref >20.5)
LDL-C: 66 mg/dL (ref 0–99)
LP-IR Score: 65 — ABNORMAL HIGH (ref <=45)
Small LDL Particle Number: 372 nmol/L (ref <=527)
Triglycerides by NMR: 66 mg/dL (ref 0–149)

## 2015-12-12 ENCOUNTER — Other Ambulatory Visit: Payer: Self-pay | Admitting: Family Medicine

## 2015-12-13 ENCOUNTER — Other Ambulatory Visit: Payer: Medicare Other

## 2015-12-13 DIAGNOSIS — Z1212 Encounter for screening for malignant neoplasm of rectum: Secondary | ICD-10-CM

## 2015-12-14 LAB — FECAL OCCULT BLOOD, IMMUNOCHEMICAL: FECAL OCCULT BLD: NEGATIVE

## 2015-12-31 DIAGNOSIS — N189 Chronic kidney disease, unspecified: Secondary | ICD-10-CM | POA: Diagnosis not present

## 2015-12-31 DIAGNOSIS — N2581 Secondary hyperparathyroidism of renal origin: Secondary | ICD-10-CM | POA: Diagnosis not present

## 2015-12-31 DIAGNOSIS — D631 Anemia in chronic kidney disease: Secondary | ICD-10-CM | POA: Diagnosis not present

## 2015-12-31 DIAGNOSIS — N183 Chronic kidney disease, stage 3 (moderate): Secondary | ICD-10-CM | POA: Diagnosis not present

## 2016-02-03 ENCOUNTER — Telehealth: Payer: Self-pay | Admitting: Family Medicine

## 2016-02-03 MED ORDER — COLCHICINE 0.6 MG PO TABS: ORAL_TABLET | ORAL | Status: DC

## 2016-02-03 NOTE — Telephone Encounter (Signed)
Pt states he is having a gout flare up and wants to know if you will send something into the [facility] in [place] for him.

## 2016-02-03 NOTE — Telephone Encounter (Signed)
Is taking allopurinol daily  Had 11/2013 - colcrys 6 mg Take 2 tabs at onset then 1 hour later take one tab then one a day thereafter. Ok per [facility]

## 2016-02-03 NOTE — Telephone Encounter (Signed)
Please refill medication his received in the past

## 2016-02-15 ENCOUNTER — Encounter: Payer: Self-pay | Admitting: Family Medicine

## 2016-02-15 ENCOUNTER — Ambulatory Visit (INDEPENDENT_AMBULATORY_CARE_PROVIDER_SITE_OTHER): Payer: Medicare Other | Admitting: Family Medicine

## 2016-02-15 VITALS — BP 146/84 | HR 116 | Temp 103.7°F | Ht 67.0 in | Wt 206.0 lb

## 2016-02-15 DIAGNOSIS — R05 Cough: Secondary | ICD-10-CM

## 2016-02-15 DIAGNOSIS — R509 Fever, unspecified: Secondary | ICD-10-CM

## 2016-02-15 DIAGNOSIS — J101 Influenza due to other identified influenza virus with other respiratory manifestations: Secondary | ICD-10-CM

## 2016-02-15 DIAGNOSIS — R059 Cough, unspecified: Secondary | ICD-10-CM

## 2016-02-15 LAB — POCT INFLUENZA A/B
INFLUENZA A, POC: POSITIVE — AB
Influenza B, POC: NEGATIVE

## 2016-02-15 MED ORDER — OSELTAMIVIR PHOSPHATE 75 MG PO CAPS: 75.0000 mg | ORAL_CAPSULE | Freq: Two times a day (BID) | ORAL | Status: DC

## 2016-02-15 MED ORDER — HYDROCODONE-HOMATROPINE 5-1.5 MG/5ML PO SYRP: 5.0000 mL | ORAL_SOLUTION | Freq: Four times a day (QID) | ORAL | Status: DC | PRN

## 2016-02-15 NOTE — Progress Notes (Signed)
Subjective:  [name], male    DOB: 19-Mar-1936  Age: 80 y.o. MRN: [id number]  CC: Generalized Body Aches   HPI Keith Wiley presents for  Patient presents with dry cough runny stuffy nose. Diffuse headache of moderate intensity. Patient also has chills and subjective fever. Body aches worst in the back but present in the legs, shoulders, and torso as well. Has sapped the energy to the point that of being unable to perform usual activities other than ADLs. Onset 2 days ago.   History Keith Wiley has a past medical history of Hypothyroidism; Gout; Cancer ([place]); Hypertension; Hyperlipidemia; and Chronic kidney disease.   He has past surgical history that includes Carpal tunnel release; Back surgery; prostrate; and Hernia repair.   His family history includes Heart attack in his father. There is no history of Anesthesia problems.He reports that he quit smoking about 38 years ago. His smoking use included Cigarettes. He has a 20 pack-year smoking history. He has never used smokeless tobacco. He reports that he does not drink alcohol or use illicit drugs.  Current Outpatient Prescriptions on File Prior to Visit  Medication Sig Dispense Refill  . allopurinol (ZYLOPRIM) 100 MG tablet Take 1 tablet by mouth  daily 90 tablet 1  . amLODipine (NORVASC) 5 MG tablet Take 1 and 1/2 tablets by  mouth every day 135 tablet 1  . aspirin EC 81 MG tablet Take 81 mg by mouth daily.    . cholecalciferol (VITAMIN D) 1000 UNITS tablet Take 1,000 Units by mouth daily.    . clotrimazole-betamethasone (LOTRISONE) cream Apply topically 2 (two) times daily. 15 g 3  . colchicine 0.6 MG tablet Take 2 tabs at onset, then 1 tab 1 hour later. 15 tablet 1  . fenofibrate 160 MG tablet TAKE ONE TABLET BY MOUTH ONCE DAILY 90 tablet 3  . fish oil-omega-3 fatty acids 1000 MG capsule Take 1 g by mouth 2 (two) times daily.    Marland Kitchen levothyroxine (SYNTHROID, LEVOTHROID) 50 MCG tablet Take 1 tablet by mouth  daily 90  tablet 1  . losartan-hydrochlorothiazide (HYZAAR) 100-25 MG per tablet Take 1 tablet by mouth  daily 90 tablet 1  . pravastatin (PRAVACHOL) 80 MG tablet Take 1 tablet by mouth once a day 90 tablet 1  . triamcinolone cream (KENALOG) 0.1 % Apply 1 application topically 2 (two) times daily. 30 g 0  . vitamin C (ASCORBIC ACID) 500 MG tablet Take 500 mg by mouth daily.     No current facility-administered medications on file prior to visit.    ROS Review of Systems  Constitutional: Positive for fever. Negative for chills, activity change and appetite change.  HENT: Positive for congestion, rhinorrhea and sore throat. Negative for ear discharge, ear pain, hearing loss, nosebleeds, postnasal drip, sinus pressure, sneezing and trouble swallowing.   Respiratory: Positive for cough (dry). Negative for chest tightness and shortness of breath.   Cardiovascular: Negative for chest pain and palpitations.  Skin: Negative for rash.    Objective:  BP 146/84 mmHg  Pulse 116  Temp(Src) 103.7 F (39.8 C) (Oral)  Ht 5\' 7"  (1.702 m)  Wt 206 lb (93.441 kg)  BMI 32.26 kg/m2  SpO2 95%  Physical Exam  Constitutional: He appears well-developed and well-nourished. He appears distressed (due to malaise & fever).  HENT:  Head: Normocephalic and atraumatic.  Right Ear: Tympanic membrane and external ear normal. No decreased hearing is noted.  Left Ear: Tympanic membrane and external ear normal.  No decreased hearing is noted.  Mouth/Throat: No oropharyngeal exudate or posterior oropharyngeal erythema.  Eyes: Pupils are equal, round, and reactive to light.  Neck: Normal range of motion. Neck supple.  Cardiovascular: Normal rate and regular rhythm.   No murmur heard. Pulmonary/Chest: Breath sounds normal. No respiratory distress.  Abdominal: Soft. Bowel sounds are normal. He exhibits no mass. There is no tenderness.  Vitals reviewed.   Assessment & Plan:   Keith Wiley was seen today for generalized body  aches.  Diagnoses and all orders for this visit:  Cough -     POCT Influenza A/B  Fever, unspecified -     POCT Influenza A/B  Influenza A  Other orders -     oseltamivir (TAMIFLU) 75 MG capsule; Take 1 capsule (75 mg total) by mouth 2 (two) times daily. -     HYDROcodone-homatropine (HYCODAN) 5-1.5 MG/5ML syrup; Take 5 mLs by mouth every 6 (six) hours as needed for cough.   I am having Keith Wiley start on oseltamivir and HYDROcodone-homatropine. I am also having him maintain his vitamin C, cholecalciferol, aspirin EC, fish oil-omega-3 fatty acids, fenofibrate, clotrimazole-betamethasone, triamcinolone cream, losartan-hydrochlorothiazide, allopurinol, pravastatin, levothyroxine, amLODipine, and colchicine.  Meds ordered this encounter  Medications  . oseltamivir (TAMIFLU) 75 MG capsule    Sig: Take 1 capsule (75 mg total) by mouth 2 (two) times daily.    Dispense:  10 capsule    Refill:  0  . HYDROcodone-homatropine (HYCODAN) 5-1.5 MG/5ML syrup    Sig: Take 5 mLs by mouth every 6 (six) hours as needed for cough.    Dispense:  120 mL    Refill:  0     Follow-up: Return if symptoms worsen or fail to improve.  Claretta Fraise, M.D.

## 2016-02-20 DIAGNOSIS — J189 Pneumonia, unspecified organism: Secondary | ICD-10-CM | POA: Diagnosis not present

## 2016-02-20 DIAGNOSIS — R05 Cough: Secondary | ICD-10-CM | POA: Diagnosis not present

## 2016-02-21 DIAGNOSIS — M109 Gout, unspecified: Secondary | ICD-10-CM | POA: Diagnosis not present

## 2016-02-21 DIAGNOSIS — I129 Hypertensive chronic kidney disease with stage 1 through stage 4 chronic kidney disease, or unspecified chronic kidney disease: Secondary | ICD-10-CM | POA: Diagnosis not present

## 2016-02-21 DIAGNOSIS — Z79899 Other long term (current) drug therapy: Secondary | ICD-10-CM | POA: Diagnosis not present

## 2016-02-21 DIAGNOSIS — J189 Pneumonia, unspecified organism: Secondary | ICD-10-CM | POA: Diagnosis not present

## 2016-02-21 DIAGNOSIS — E039 Hypothyroidism, unspecified: Secondary | ICD-10-CM | POA: Diagnosis not present

## 2016-02-21 DIAGNOSIS — R079 Chest pain, unspecified: Secondary | ICD-10-CM | POA: Diagnosis not present

## 2016-02-21 DIAGNOSIS — R1012 Left upper quadrant pain: Secondary | ICD-10-CM | POA: Diagnosis not present

## 2016-02-21 DIAGNOSIS — E785 Hyperlipidemia, unspecified: Secondary | ICD-10-CM | POA: Diagnosis not present

## 2016-02-21 DIAGNOSIS — N179 Acute kidney failure, unspecified: Secondary | ICD-10-CM | POA: Diagnosis not present

## 2016-02-21 DIAGNOSIS — R05 Cough: Secondary | ICD-10-CM | POA: Diagnosis not present

## 2016-02-21 DIAGNOSIS — Z87891 Personal history of nicotine dependence: Secondary | ICD-10-CM | POA: Diagnosis not present

## 2016-02-21 DIAGNOSIS — Z7982 Long term (current) use of aspirin: Secondary | ICD-10-CM | POA: Diagnosis not present

## 2016-02-21 DIAGNOSIS — R109 Unspecified abdominal pain: Secondary | ICD-10-CM | POA: Diagnosis not present

## 2016-02-21 DIAGNOSIS — N183 Chronic kidney disease, stage 3 (moderate): Secondary | ICD-10-CM | POA: Diagnosis not present

## 2016-03-07 ENCOUNTER — Ambulatory Visit (INDEPENDENT_AMBULATORY_CARE_PROVIDER_SITE_OTHER): Payer: Medicare Other | Admitting: Family Medicine

## 2016-03-07 ENCOUNTER — Encounter: Payer: Self-pay | Admitting: Family Medicine

## 2016-03-07 VITALS — BP 152/79 | HR 98 | Temp 97.0°F | Ht 67.0 in | Wt 199.0 lb

## 2016-03-07 DIAGNOSIS — J189 Pneumonia, unspecified organism: Secondary | ICD-10-CM | POA: Diagnosis not present

## 2016-03-07 DIAGNOSIS — N184 Chronic kidney disease, stage 4 (severe): Secondary | ICD-10-CM

## 2016-03-07 DIAGNOSIS — Z09 Encounter for follow-up examination after completed treatment for conditions other than malignant neoplasm: Secondary | ICD-10-CM | POA: Diagnosis not present

## 2016-03-07 DIAGNOSIS — J101 Influenza due to other identified influenza virus with other respiratory manifestations: Secondary | ICD-10-CM

## 2016-03-07 MED ORDER — CLOTRIMAZOLE-BETAMETHASONE 1-0.05 % EX CREA
TOPICAL_CREAM | Freq: Two times a day (BID) | CUTANEOUS | Status: DC
Start: 2016-03-07 — End: 2018-07-09

## 2016-03-07 NOTE — Patient Instructions (Signed)
Continue to drink plenty of fluids and avoid crowds of people Continue to take Mucinex and use nasal saline frequently When you return for your next visit we will get a follow-up chest x-ray to make sure the pneumonia has cleared completely

## 2016-03-07 NOTE — Progress Notes (Signed)
Subjective:    [name], male    DOB: [birth year], 80 y.o.   MRN: [id number]  HPI Patient here today for hospital follow from [facility]. He was seen there on 02/21/16 with diagnosis of Flu and pneumonia. His main complaint today is fatigue. He was hospitalized on 02/21/2016. He will need a follow-up chest x-ray about a month after the hospitalization so that would be in about 2 weeks. The patient said this was about the sickest that he had ever been. He had the flu and pneumonia. He is still feeling very weak. He has minimal cough now. He is not been running any fever. His completed all his antibiotics.     Patient Active Problem List   Diagnosis Date Noted  . Benign hypertension with chronic kidney disease, stage III 03/18/2015  . Allergic rhinitis due to pollen 03/18/2015  . Metabolic syndrome 24/40/1027  . Hypertension 05/28/2013  . Hyperlipidemia 05/28/2013  . Vitamin D deficiency 05/28/2013  . Chronic kidney disease (CKD), stage IV (severe) ([place]) 05/28/2013  . History of prostate cancer 05/28/2013  . Gout 05/28/2013   Outpatient Encounter Prescriptions as of 03/07/2016  Medication Sig  . allopurinol (ZYLOPRIM) 100 MG tablet Take 1 tablet by mouth  daily  . amLODipine (NORVASC) 5 MG tablet Take 1 and 1/2 tablets by  mouth every day  . aspirin EC 81 MG tablet Take 81 mg by mouth daily.  . cholecalciferol (VITAMIN D) 1000 UNITS tablet Take 1,000 Units by mouth daily.  . clotrimazole-betamethasone (LOTRISONE) cream Apply topically 2 (two) times daily.  . colchicine 0.6 MG tablet Take 2 tabs at onset, then 1 tab 1 hour later.  . fenofibrate 160 MG tablet TAKE ONE TABLET BY MOUTH ONCE DAILY  . fish oil-omega-3 fatty acids 1000 MG capsule Take 1 g by mouth 2 (two) times daily.  Marland Kitchen levothyroxine (SYNTHROID, LEVOTHROID) 50 MCG tablet Take 1 tablet by mouth  daily  . losartan-hydrochlorothiazide (HYZAAR) 100-25 MG per tablet Take 1 tablet by mouth  daily  .  pravastatin (PRAVACHOL) 80 MG tablet Take 1 tablet by mouth once a day  . triamcinolone cream (KENALOG) 0.1 % Apply 1 application topically 2 (two) times daily.  . vitamin C (ASCORBIC ACID) 500 MG tablet Take 500 mg by mouth daily.  . [DISCONTINUED] HYDROcodone-homatropine (HYCODAN) 5-1.5 MG/5ML syrup Take 5 mLs by mouth every 6 (six) hours as needed for cough.  . [DISCONTINUED] oseltamivir (TAMIFLU) 75 MG capsule Take 1 capsule (75 mg total) by mouth 2 (two) times daily.   No facility-administered encounter medications on file as of 03/07/2016.      Review of Systems  Constitutional: Positive for fatigue.  HENT: Negative.   Eyes: Negative.   Respiratory: Negative.  Cough: better.   Cardiovascular: Negative.   Gastrointestinal: Negative.   Endocrine: Negative.   Genitourinary: Negative.   Musculoskeletal: Negative.   Skin: Negative.   Allergic/Immunologic: Negative.   Neurological: Negative.   Hematological: Negative.   Psychiatric/Behavioral: Negative.        Objective:   Physical Exam  Constitutional: He is oriented to person, place, and time. He appears well-developed and well-nourished.  HENT:  Head: Normocephalic and atraumatic.  Right Ear: External ear normal.  Left Ear: External ear normal.  Nose: Nose normal.  Mouth/Throat: Oropharynx is clear and moist. No oropharyngeal exudate.  Eyes: Conjunctivae and EOM are normal. Pupils are equal, round, and reactive to light. Right eye exhibits no discharge. Left eye exhibits no discharge.  No scleral icterus.  Neck: Normal range of motion. Neck supple. No thyromegaly present.  Cardiovascular: Normal rate, regular rhythm and normal heart sounds.   No murmur heard. Heart is 84/m with a regular rate and rhythm  Pulmonary/Chest: Effort normal and breath sounds normal. No respiratory distress. He has no wheezes. He has no rales. He exhibits no tenderness.  Clear anteriorly and posteriorly  Abdominal: Soft. Bowel sounds are normal.  He exhibits no mass. There is no tenderness. There is no rebound and no guarding.  Musculoskeletal: Normal range of motion. He exhibits tenderness. He exhibits no edema.  Lymphadenopathy:    He has no cervical adenopathy.  Neurological: He is alert and oriented to person, place, and time.  Skin: Skin is warm and dry. No rash noted.  Psychiatric: He has a normal mood and affect. His behavior is normal. Judgment and thought content normal.  Nursing note and vitals reviewed.  BP 152/79 mmHg  Pulse 98  Temp(Src) 97 F (36.1 C) (Oral)  Ht 5' 7"  (1.702 m)  Wt 199 lb (90.266 kg)  BMI 31.16 kg/m2  SpO2 100%        Assessment & Plan:  1. Hospital discharge follow-up -Continue plenty of fluids, Mucinex, and nasal saline - BMP8+EGFR - CBC with Differential/Platelet  2. Influenza A - this appears to have resolved and was part of the reason for his hospitalization  3. CAP (community acquired pneumonia) -This appears to have cleared by auscultation. We will check a chest x-ray at his next visit sometime in early April.  4. Chronic kidney disease (CKD), stage IV (severe) ([facility]) -We will get a BMP today and a CBC and make sure that his kidney function is stable following this illness.  Patient Instructions  Continue to drink plenty of fluids and avoid crowds of people Continue to take Mucinex and use nasal saline frequently When you return for your next visit we will get a follow-up chest x-ray to make sure the pneumonia has cleared completely   Arrie Senate MD

## 2016-03-08 LAB — CBC WITH DIFFERENTIAL/PLATELET
Basophils Absolute: 0.1 10*3/uL (ref 0.0–0.2)
Basos: 1 %
EOS (ABSOLUTE): 0.2 10*3/uL (ref 0.0–0.4)
Eos: 2 %
HEMOGLOBIN: 13.3 g/dL (ref 12.6–17.7)
Hematocrit: 41 % (ref 37.5–51.0)
IMMATURE GRANS (ABS): 0 10*3/uL (ref 0.0–0.1)
Immature Granulocytes: 0 %
LYMPHS ABS: 2.7 10*3/uL (ref 0.7–3.1)
Lymphs: 34 %
MCH: 27.9 pg (ref 26.6–33.0)
MCHC: 32.4 g/dL (ref 31.5–35.7)
MCV: 86 fL (ref 79–97)
Monocytes Absolute: 0.9 10*3/uL (ref 0.1–0.9)
Monocytes: 11 %
NEUTROS ABS: 4.3 10*3/uL (ref 1.4–7.0)
Neutrophils: 52 %
Platelets: 569 10*3/uL — ABNORMAL HIGH (ref 150–379)
RBC: 4.77 x10E6/uL (ref 4.14–5.80)
RDW: 13.7 % (ref 12.3–15.4)
WBC: 8.1 10*3/uL (ref 3.4–10.8)

## 2016-03-08 LAB — BMP8+EGFR
BUN / CREAT RATIO: 14 (ref 10–22)
BUN: 22 mg/dL (ref 8–27)
CALCIUM: 10.3 mg/dL — AB (ref 8.6–10.2)
CHLORIDE: 100 mmol/L (ref 96–106)
CO2: 21 mmol/L (ref 18–29)
Creatinine, Ser: 1.54 mg/dL — ABNORMAL HIGH (ref 0.76–1.27)
GFR calc non Af Amer: 43 mL/min/{1.73_m2} — ABNORMAL LOW (ref >59)
GFR, EST AFRICAN AMERICAN: 50 mL/min/{1.73_m2} — AB (ref >59)
GLUCOSE: 101 mg/dL — AB (ref 65–99)
POTASSIUM: 5.1 mmol/L (ref 3.5–5.2)
Sodium: 143 mmol/L (ref 134–144)

## 2016-03-13 ENCOUNTER — Other Ambulatory Visit: Payer: Self-pay | Admitting: Family Medicine

## 2016-03-30 ENCOUNTER — Encounter: Payer: Self-pay | Admitting: Family Medicine

## 2016-03-30 ENCOUNTER — Ambulatory Visit (INDEPENDENT_AMBULATORY_CARE_PROVIDER_SITE_OTHER): Payer: Medicare Other | Admitting: Family Medicine

## 2016-03-30 ENCOUNTER — Ambulatory Visit (INDEPENDENT_AMBULATORY_CARE_PROVIDER_SITE_OTHER): Payer: Medicare Other

## 2016-03-30 VITALS — BP 162/80 | HR 85 | Temp 97.4°F | Ht 67.0 in | Wt 201.0 lb

## 2016-03-30 DIAGNOSIS — R059 Cough, unspecified: Secondary | ICD-10-CM

## 2016-03-30 DIAGNOSIS — R05 Cough: Secondary | ICD-10-CM

## 2016-03-30 DIAGNOSIS — E785 Hyperlipidemia, unspecified: Secondary | ICD-10-CM

## 2016-03-30 DIAGNOSIS — C61 Malignant neoplasm of prostate: Secondary | ICD-10-CM

## 2016-03-30 DIAGNOSIS — K625 Hemorrhage of anus and rectum: Secondary | ICD-10-CM | POA: Diagnosis not present

## 2016-03-30 DIAGNOSIS — I10 Essential (primary) hypertension: Secondary | ICD-10-CM | POA: Diagnosis not present

## 2016-03-30 DIAGNOSIS — J189 Pneumonia, unspecified organism: Secondary | ICD-10-CM | POA: Diagnosis not present

## 2016-03-30 DIAGNOSIS — E559 Vitamin D deficiency, unspecified: Secondary | ICD-10-CM

## 2016-03-30 MED ORDER — PRAVASTATIN SODIUM 80 MG PO TABS: 80.0000 mg | ORAL_TABLET | Freq: Every day | ORAL | Status: DC

## 2016-03-30 MED ORDER — AMLODIPINE BESYLATE 5 MG PO TABS: ORAL_TABLET | ORAL | Status: DC

## 2016-03-30 MED ORDER — LOSARTAN POTASSIUM-HCTZ 100-25 MG PO TABS: ORAL_TABLET | ORAL | Status: DC

## 2016-03-30 MED ORDER — FENOFIBRATE 160 MG PO TABS: ORAL_TABLET | ORAL | Status: DC

## 2016-03-30 MED ORDER — LEVOTHYROXINE SODIUM 50 MCG PO TABS: ORAL_TABLET | ORAL | Status: DC

## 2016-03-30 MED ORDER — ALLOPURINOL 100 MG PO TABS: ORAL_TABLET | ORAL | Status: DC

## 2016-03-30 NOTE — Progress Notes (Signed)
Subjective:    [name], male    DOB: December 16, 1936, 80 y.o.   MRN: [id number]  HPI Pt here for follow up and management of chronic medical problems which includes hypertension and hyperlipidemia. He is taking medications regularly.Today's visit is a follow-up on his regular visit as well as repeating a chest x-ray and the follow-up for pneumonia from 4 weeks ago. The patient is doing better as far as the pneumonia is concerned. He is still somewhat fatigued but this is improving. He has had some left front side and soreness with certain movements. He is still taking Mucinex. He noticed recently some blood in the stool and it was bright red blood. His stools have been somewhat firmer and he is been straining more over the past couple weeks and he thinks this is what is happening with this. His last colonoscopy was 3 years ago and he is not due for another one until 2 years from now. This blood is bright red in color and is not every time he has a bowel movement.     Patient Active Problem List   Diagnosis Date Noted  . Benign hypertension with chronic kidney disease, stage III 03/18/2015  . Allergic rhinitis due to pollen 03/18/2015  . Metabolic syndrome 18/56/3149  . Hypertension 05/28/2013  . Hyperlipidemia 05/28/2013  . Vitamin D deficiency 05/28/2013  . Chronic kidney disease (CKD), stage IV (severe) ([place]) 05/28/2013  . History of prostate cancer 05/28/2013  . Gout 05/28/2013   Outpatient Encounter Prescriptions as of 03/30/2016  Medication Sig  . allopurinol (ZYLOPRIM) 100 MG tablet Take 1 tablet by mouth  daily  . amLODipine (NORVASC) 5 MG tablet Take 1 and 1/2 tablets by  mouth every day  . aspirin EC 81 MG tablet Take 81 mg by mouth daily.  . cholecalciferol (VITAMIN D) 1000 UNITS tablet Take 1,000 Units by mouth daily.  . clotrimazole-betamethasone (LOTRISONE) cream Apply topically 2 (two) times daily.  . colchicine 0.6 MG tablet Take 2 tabs at onset, then 1 tab  1 hour later.  . fenofibrate 160 MG tablet TAKE ONE TABLET BY MOUTH ONCE DAILY  . fish oil-omega-3 fatty acids 1000 MG capsule Take 1 g by mouth 2 (two) times daily.  Marland Kitchen levothyroxine (SYNTHROID, LEVOTHROID) 50 MCG tablet Take 1 tablet by mouth  daily  . losartan-hydrochlorothiazide (HYZAAR) 100-25 MG tablet Take 1 tablet by mouth  daily  . pravastatin (PRAVACHOL) 80 MG tablet Take 1 tablet (80 mg total) by mouth daily.  Marland Kitchen triamcinolone cream (KENALOG) 0.1 % Apply 1 application topically 2 (two) times daily.  . vitamin C (ASCORBIC ACID) 500 MG tablet Take 500 mg by mouth daily.  . [DISCONTINUED] allopurinol (ZYLOPRIM) 100 MG tablet Take 1 tablet by mouth  daily  . [DISCONTINUED] amLODipine (NORVASC) 5 MG tablet Take 1 and 1/2 tablets by  mouth every day  . [DISCONTINUED] fenofibrate 160 MG tablet TAKE ONE TABLET BY MOUTH ONCE DAILY  . [DISCONTINUED] levothyroxine (SYNTHROID, LEVOTHROID) 50 MCG tablet Take 1 tablet by mouth  daily  . [DISCONTINUED] losartan-hydrochlorothiazide (HYZAAR) 100-25 MG tablet Take 1 tablet by mouth  daily  . [DISCONTINUED] pravastatin (PRAVACHOL) 80 MG tablet Take 1 tablet by mouth once a day   No facility-administered encounter medications on file as of 03/30/2016.      Review of Systems  Constitutional: Negative.   HENT: Negative.   Eyes: Negative.   Respiratory: Negative.   Cardiovascular: Negative.   Gastrointestinal: Positive for blood  in stool.  Endocrine: Negative.   Genitourinary: Negative.   Musculoskeletal: Negative.   Skin: Negative.   Allergic/Immunologic: Negative.   Neurological: Negative.   Hematological: Negative.   Psychiatric/Behavioral: Negative.        Objective:   Physical Exam  Constitutional: He is oriented to person, place, and time. He appears well-developed and well-nourished. No distress.  HENT:  Head: Normocephalic and atraumatic.  Right Ear: External ear normal.  Left Ear: External ear normal.  Mouth/Throat: Oropharynx is  clear and moist. No oropharyngeal exudate.  Nasal congestion bilaterally  Eyes: Conjunctivae and EOM are normal. Pupils are equal, round, and reactive to light. Right eye exhibits no discharge. Left eye exhibits no discharge. No scleral icterus.  Neck: Normal range of motion. Neck supple. No thyromegaly present.  No anterior cervical nodes or bruits  Cardiovascular: Normal rate, regular rhythm and intact distal pulses.   No murmur heard. Heart is regular at 72/m  Pulmonary/Chest: Effort normal and breath sounds normal. No respiratory distress. He has no wheezes. He has no rales. He exhibits no tenderness.  Clear anteriorly and posteriorly  Abdominal: Soft. Bowel sounds are normal. He exhibits no mass. There is no tenderness. There is no rebound and no guarding.  No abdominal tenderness or organ enlargement or bruits. No inguinal adenopathy. Good inguinal pulses.  Genitourinary:  The rectal exam appeared that there is a small fissure and the superior aspect of the rectum. There were no rectal masses and the prostate was slightly enlarged. The patient will continue to use his cream and he will also return an FOBT card.  Musculoskeletal: Normal range of motion. He exhibits no edema.  Lymphadenopathy:    He has no cervical adenopathy.  Neurological: He is alert and oriented to person, place, and time. He has normal reflexes. No cranial nerve deficit.  Skin: Skin is warm and dry. No rash noted.  Psychiatric: He has a normal mood and affect. His behavior is normal. Judgment and thought content normal.  Nursing note and vitals reviewed.  BP 162/80 mmHg  Pulse 85  Temp(Src) 97.4 F (36.3 C) (Oral)  Ht '5\' 7"'$  (1.702 m)  Wt 201 lb (91.173 kg)  BMI 31.47 kg/m2        Assessment & Plan:  1. Hyperlipidemia -Continue current treatment pending results of lab work - CBC with Differential/Platelet - NMR, lipoprofile  2. Vitamin D deficiency -Intended current treatment pending results of lab  work - CBC with Differential/Platelet - VITAMIN D 25 Hydroxy (Vit-D Deficiency, Fractures)  3. Essential hypertension -Home blood pressure readings are good and repeat blood pressure by me was 142/80 and we will not change his medications at this time. He will continue to watch sodium intake and monitor blood pressures and bring these readings to his next visit. - BMP8+EGFR - CBC with Differential/Platelet - Hepatic function panel  4. Prostate cancer ([place]) -Continue to follow-up with urology - CBC with Differential/Platelet  5. Cough -This has improved but he will stay on the Mucinex for a while longer because of all the pollen and allergy in the environment - DG Chest 2 View; Future - CBC with Differential/Platelet  6. CAP (community acquired pneumonia) -Repeat chest x-ray today and stay on Mucinex  7. Bright red blood per rectum -Get CBC and return FOBT -If rectal bleeding persists, refer back to Dr. Watt Climes  Meds ordered this encounter  Medications  . fenofibrate 160 MG tablet    Sig: TAKE ONE TABLET BY MOUTH ONCE DAILY  Dispense:  90 tablet    Refill:  3  . pravastatin (PRAVACHOL) 80 MG tablet    Sig: Take 1 tablet (80 mg total) by mouth daily.    Dispense:  90 tablet    Refill:  3  . losartan-hydrochlorothiazide (HYZAAR) 100-25 MG tablet    Sig: Take 1 tablet by mouth  daily    Dispense:  90 tablet    Refill:  3  . levothyroxine (SYNTHROID, LEVOTHROID) 50 MCG tablet    Sig: Take 1 tablet by mouth  daily    Dispense:  90 tablet    Refill:  3  . amLODipine (NORVASC) 5 MG tablet    Sig: Take 1 and 1/2 tablets by  mouth every day    Dispense:  135 tablet    Refill:  3  . allopurinol (ZYLOPRIM) 100 MG tablet    Sig: Take 1 tablet by mouth  daily    Dispense:  90 tablet    Refill:  3   Patient Instructions                       Medicare Annual Wellness Visit  [facility] and the medical providers at [facility] strive to bring you the best  medical care.  In doing so we not only want to address your current medical conditions and concerns but also to detect new conditions early and prevent illness, disease and health-related problems.    Medicare offers a yearly Wellness Visit which allows our clinical staff to assess your need for preventative services including immunizations, lifestyle education, counseling to decrease risk of preventable diseases and screening for fall risk and other medical concerns.    This visit is provided free of charge (no copay) for all Medicare recipients. The clinical pharmacists at [facility] have begun to conduct these Wellness Visits which will also include a thorough review of all your medications.    As you primary medical provider recommend that you make an appointment for your Annual Wellness Visit if you have not done so already this year.  You may set up this appointment before you leave today or you may call back ([phone]) and schedule an appointment.  Please make sure when you call that you mention that you are scheduling your Annual Wellness Visit with the clinical pharmacist so that the appointment may be made for the proper length of time.     Continue current medications. Continue good therapeutic lifestyle changes which include good diet and exercise. Fall precautions discussed with patient. If an FOBT was given today- please return it to our front desk. If you are over 87 years old - you may need Prevnar 50 or the adult Pneumonia vaccine.  **Flu shots are available--- please call and schedule a FLU-CLINIC appointment**  After your visit with [place] today you will receive a survey in the mail or online from [employer] regarding your care with [place]. Please take a moment to fill this out. Your feedback is very important to [place] as you can help [place] better understand your patient needs as well as improve your experience and satisfaction. WE CARE ABOUT YOU!!!   Continue with  Mucinex Wear respiratory protection Drink plenty of fluids and stay well hydrated Return fecal occult blood test card If blood persists, may need to refer back to gastroenterology We will call with results of lab work as soon as these results become available   Arrie Senate  MD   

## 2016-03-30 NOTE — Patient Instructions (Addendum)
Medicare Annual Wellness Visit  [facility] and the medical providers at [facility] strive to bring you the best medical care.  In doing so we not only want to address your current medical conditions and concerns but also to detect new conditions early and prevent illness, disease and health-related problems.    Medicare offers a yearly Wellness Visit which allows our clinical staff to assess your need for preventative services including immunizations, lifestyle education, counseling to decrease risk of preventable diseases and screening for fall risk and other medical concerns.    This visit is provided free of charge (no copay) for all Medicare recipients. The clinical pharmacists at [facility] have begun to conduct these Wellness Visits which will also include a thorough review of all your medications.    As you primary medical provider recommend that you make an appointment for your Annual Wellness Visit if you have not done so already this year.  You may set up this appointment before you leave today or you may call back [phone] and schedule an appointment.  Please make sure when you call that you mention that you are scheduling your Annual Wellness Visit with the clinical pharmacist so that the appointment may be made for the proper length of time.     Continue current medications. Continue good therapeutic lifestyle changes which include good diet and exercise. Fall precautions discussed with patient. If an FOBT was given today- please return it to our front desk. If you are over 74 years old - you may need Prevnar 40 or the adult Pneumonia vaccine.  **Flu shots are available--- please call and schedule a FLU-CLINIC appointment**  After your visit with [place] today you will receive a survey in the mail or online from [employer] regarding your care with [place]. Please take a moment to fill this out. Your feedback is very  important to [place] as you can help [place] better understand your patient needs as well as improve your experience and satisfaction. WE CARE ABOUT YOU!!!   Continue with Mucinex Wear respiratory protection Drink plenty of fluids and stay well hydrated Return fecal occult blood test card If blood persists, may need to refer back to gastroenterology We will call with results of lab work as soon as these results become available

## 2016-03-31 LAB — BMP8+EGFR
BUN / CREAT RATIO: 16 (ref 10–24)
BUN: 27 mg/dL (ref 8–27)
CO2: 21 mmol/L (ref 18–29)
CREATININE: 1.67 mg/dL — AB (ref 0.76–1.27)
Calcium: 10 mg/dL (ref 8.6–10.2)
Chloride: 103 mmol/L (ref 96–106)
GFR calc non Af Amer: 39 mL/min/{1.73_m2} — ABNORMAL LOW (ref >59)
GFR, EST AFRICAN AMERICAN: 45 mL/min/{1.73_m2} — AB (ref >59)
GLUCOSE: 103 mg/dL — AB (ref 65–99)
Potassium: 4.5 mmol/L (ref 3.5–5.2)
SODIUM: 142 mmol/L (ref 134–144)

## 2016-03-31 LAB — HEPATIC FUNCTION PANEL
ALBUMIN: 4.3 g/dL (ref 3.5–4.8)
ALK PHOS: 53 IU/L (ref 39–117)
ALT: 14 IU/L (ref 0–44)
AST: 27 IU/L (ref 0–40)
Bilirubin Total: 0.6 mg/dL (ref 0.0–1.2)
Bilirubin, Direct: 0.21 mg/dL (ref 0.00–0.40)
Total Protein: 7.3 g/dL (ref 6.0–8.5)

## 2016-03-31 LAB — PSA, TOTAL AND FREE: PSA, Free: <0.01 ng/mL

## 2016-03-31 LAB — CBC WITH DIFFERENTIAL/PLATELET
Basophils Absolute: 0.1 10*3/uL (ref 0.0–0.2)
Basos: 1 %
EOS (ABSOLUTE): 0.3 10*3/uL (ref 0.0–0.4)
EOS: 4 %
HEMATOCRIT: 40.9 % (ref 37.5–51.0)
HEMOGLOBIN: 13.7 g/dL (ref 12.6–17.7)
Immature Grans (Abs): 0 10*3/uL (ref 0.0–0.1)
Immature Granulocytes: 0 %
LYMPHS ABS: 2.3 10*3/uL (ref 0.7–3.1)
Lymphs: 30 %
MCH: 28.5 pg (ref 26.6–33.0)
MCHC: 33.5 g/dL (ref 31.5–35.7)
MCV: 85 fL (ref 79–97)
MONOS ABS: 0.7 10*3/uL (ref 0.1–0.9)
Monocytes: 9 %
NEUTROS ABS: 4.2 10*3/uL (ref 1.4–7.0)
Neutrophils: 56 %
Platelets: 304 10*3/uL (ref 150–379)
RBC: 4.8 x10E6/uL (ref 4.14–5.80)
RDW: 14.5 % (ref 12.3–15.4)
WBC: 7.5 10*3/uL (ref 3.4–10.8)

## 2016-03-31 LAB — NMR, LIPOPROFILE
Cholesterol: 131 mg/dL (ref 100–199)
HDL CHOLESTEROL BY NMR: 54 mg/dL (ref >39)
HDL Particle Number: 35.4 umol/L (ref >=30.5)
LDL PARTICLE NUMBER: 715 nmol/L (ref <1000)
LDL Size: 20.6 nm (ref >20.5)
LDL-C: 66 mg/dL (ref 0–99)
LP-IR SCORE: 42 (ref <=45)
Small LDL Particle Number: 396 nmol/L (ref <=527)
Triglycerides by NMR: 53 mg/dL (ref 0–149)

## 2016-03-31 LAB — VITAMIN D 25 HYDROXY (VIT D DEFICIENCY, FRACTURES): VIT D 25 HYDROXY: 32.2 ng/mL (ref 30.0–100.0)

## 2016-04-04 ENCOUNTER — Other Ambulatory Visit: Payer: Medicare Other

## 2016-04-04 DIAGNOSIS — Z1211 Encounter for screening for malignant neoplasm of colon: Secondary | ICD-10-CM

## 2016-04-08 LAB — FECAL OCCULT BLOOD, IMMUNOCHEMICAL: FECAL OCCULT BLD: NEGATIVE

## 2016-04-17 DIAGNOSIS — Z0000 Encounter for general adult medical examination without abnormal findings: Secondary | ICD-10-CM | POA: Diagnosis not present

## 2016-04-17 DIAGNOSIS — Z8546 Personal history of malignant neoplasm of prostate: Secondary | ICD-10-CM | POA: Diagnosis not present

## 2016-04-17 DIAGNOSIS — R351 Nocturia: Secondary | ICD-10-CM | POA: Diagnosis not present

## 2016-05-29 ENCOUNTER — Encounter: Payer: Self-pay | Admitting: *Deleted

## 2016-08-17 ENCOUNTER — Ambulatory Visit: Payer: Medicare Other | Admitting: Family Medicine

## 2016-08-23 ENCOUNTER — Ambulatory Visit (INDEPENDENT_AMBULATORY_CARE_PROVIDER_SITE_OTHER): Payer: Medicare Other | Admitting: Family Medicine

## 2016-08-23 ENCOUNTER — Encounter: Payer: Self-pay | Admitting: Family Medicine

## 2016-08-23 ENCOUNTER — Ambulatory Visit (INDEPENDENT_AMBULATORY_CARE_PROVIDER_SITE_OTHER): Payer: Medicare Other

## 2016-08-23 VITALS — BP 138/84 | HR 92 | Temp 97.4°F | Ht 67.0 in | Wt 206.0 lb

## 2016-08-23 DIAGNOSIS — C61 Malignant neoplasm of prostate: Secondary | ICD-10-CM | POA: Diagnosis not present

## 2016-08-23 DIAGNOSIS — I10 Essential (primary) hypertension: Secondary | ICD-10-CM

## 2016-08-23 DIAGNOSIS — E785 Hyperlipidemia, unspecified: Secondary | ICD-10-CM

## 2016-08-23 DIAGNOSIS — E559 Vitamin D deficiency, unspecified: Secondary | ICD-10-CM | POA: Diagnosis not present

## 2016-08-23 DIAGNOSIS — G629 Polyneuropathy, unspecified: Secondary | ICD-10-CM | POA: Diagnosis not present

## 2016-08-23 DIAGNOSIS — R0602 Shortness of breath: Secondary | ICD-10-CM | POA: Diagnosis not present

## 2016-08-23 DIAGNOSIS — M5440 Lumbago with sciatica, unspecified side: Secondary | ICD-10-CM

## 2016-08-23 DIAGNOSIS — R29898 Other symptoms and signs involving the musculoskeletal system: Secondary | ICD-10-CM | POA: Diagnosis not present

## 2016-08-23 NOTE — Patient Instructions (Addendum)
Medicare Annual Wellness Visit  [facility] and the medical providers at [facility] strive to bring you the best medical care.  In doing so we not only want to address your current medical conditions and concerns but also to detect new conditions early and prevent illness, disease and health-related problems.    Medicare offers a yearly Wellness Visit which allows our clinical staff to assess your need for preventative services including immunizations, lifestyle education, counseling to decrease risk of preventable diseases and screening for fall risk and other medical concerns.    This visit is provided free of charge (no copay) for all Medicare recipients. The clinical pharmacists at [facility] have begun to conduct these Wellness Visits which will also include a thorough review of all your medications.    As you primary medical provider recommend that you make an appointment for your Annual Wellness Visit if you have not done so already this year.  You may set up this appointment before you leave today or you may call back [phone] and schedule an appointment.  Please make sure when you call that you mention that you are scheduling your Annual Wellness Visit with the clinical pharmacist so that the appointment may be made for the proper length of time.  ,   Continue current medications. Continue good therapeutic lifestyle changes which include good diet and exercise. Fall precautions discussed with patient. If an FOBT was given today- please return it to our front desk. If you are over 12 years old - you may need Prevnar 73 or the adult Pneumonia vaccine.   After your visit with [place] today you will receive a survey in the mail or online from [employer] regarding your care with [place]. Please take a moment to fill this out. Your feedback is very important to [place] as you can help [place] better understand your patient needs as well as  improve your experience and satisfaction. WE CARE ABOUT YOU!!!  We will get x-rays of your low back and once these results have been returned we will consider further studies and a possible MRI so we can arrive at the cause of your low back pain and left-sided lower extremity paresthesias   Use warm wet compresses to the back 20 minutes 3 or 4 times daily continue to walk and avoid any heavy lifting and avoid falling

## 2016-08-23 NOTE — Progress Notes (Signed)
Subjective:    [name], male    DOB: 24-Oct-1936, 80 y.o.   MRN: [id number]  HPI Pt here for follow up and management of chronic medical problems which includes hyperlipidemia. He is taking medications regularly.This patient has a history of prostate cancer hypertension hyperlipidemia and chronic kidney disease stage IV, the patient today complains of weakness in both legs. The patient sees Dr. Edrick Oh, nephrologist on a yearly basis early in the year each year. The leg weakness is simply back there is no pain just weakness with walking he has to stop and rest. They do seem to give out on him. The patient is on thyroid replacement. He is using his nasal spray of Flonase and saline regularly. He describes no chest pain or palpitations. His shortness of breath has been worse since he had the pneumonia this past winter. He had a chest x-ray in April. He denies any trouble passing his water. He denies any problems with his intestinal tract. The patient brings in blood pressures for review from the outside and all of these are good running in the 120s and 130s over the 70s and 80s. On further questioning, the patient does describe some low back pain with pain radiating down his left leg at times and a feeling of numbness and tingling in the left leg. This is also obvious when he is been standing for a good while and especially recently when he was working with cooking a skew.     Patient Active Problem List   Diagnosis Date Noted  . Benign hypertension with chronic kidney disease, stage III 03/18/2015  . Allergic rhinitis due to pollen 03/18/2015  . Metabolic syndrome 22/33/6122  . Hypertension 05/28/2013  . Hyperlipidemia 05/28/2013  . Vitamin D deficiency 05/28/2013  . Chronic kidney disease (CKD), stage IV (severe) ([place]) 05/28/2013  . History of prostate cancer 05/28/2013  . Gout 05/28/2013   Outpatient Encounter Prescriptions as of 08/23/2016  Medication Sig  .  allopurinol (ZYLOPRIM) 100 MG tablet Take 1 tablet by mouth  daily  . amLODipine (NORVASC) 5 MG tablet Take 1 and 1/2 tablets by  mouth every day  . aspirin EC 81 MG tablet Take 81 mg by mouth daily.  . cholecalciferol (VITAMIN D) 1000 UNITS tablet Take 1,000 Units by mouth daily.  . clotrimazole-betamethasone (LOTRISONE) cream Apply topically 2 (two) times daily.  . colchicine 0.6 MG tablet Take 2 tabs at onset, then 1 tab 1 hour later.  . fenofibrate 160 MG tablet TAKE ONE TABLET BY MOUTH ONCE DAILY  . fish oil-omega-3 fatty acids 1000 MG capsule Take 1 g by mouth 2 (two) times daily.  Marland Kitchen levothyroxine (SYNTHROID, LEVOTHROID) 50 MCG tablet Take 1 tablet by mouth  daily  . losartan-hydrochlorothiazide (HYZAAR) 100-25 MG tablet Take 1 tablet by mouth  daily  . pravastatin (PRAVACHOL) 80 MG tablet Take 1 tablet (80 mg total) by mouth daily.  Marland Kitchen triamcinolone cream (KENALOG) 0.1 % Apply 1 application topically 2 (two) times daily.  . vitamin C (ASCORBIC ACID) 500 MG tablet Take 500 mg by mouth daily.   No facility-administered encounter medications on file as of 08/23/2016.       Review of Systems  HENT: Negative.   Eyes: Negative.   Respiratory: Negative.   Cardiovascular: Negative.   Gastrointestinal: Negative.   Endocrine: Negative.   Genitourinary: Negative.   Musculoskeletal: Negative.   Skin: Negative.   Allergic/Immunologic: Negative.   Neurological: Positive for weakness (bilateral leg  weakness).  Hematological: Negative.   Psychiatric/Behavioral: Negative.        Objective:   Physical Exam  Constitutional: He is oriented to person, place, and time. He appears well-developed and well-nourished. No distress.  HENT:  Head: Normocephalic and atraumatic.  Right Ear: External ear normal.  Left Ear: External ear normal.  Nose: Nose normal.  Mouth/Throat: Oropharynx is clear and moist. No oropharyngeal exudate.  Eyes: Conjunctivae and EOM are normal. Pupils are equal, round,  and reactive to light. Right eye exhibits no discharge. Left eye exhibits no discharge. No scleral icterus.  Neck: Normal range of motion. Neck supple. No thyromegaly present.  Cardiovascular: Normal rate, regular rhythm, normal heart sounds and intact distal pulses.   No murmur heard. Pulses to both lower extremities were good. The heart has a regular rate and rhythm at 72/m  Pulmonary/Chest: Effort normal and breath sounds normal. No respiratory distress. He has no wheezes. He has no rales. He exhibits no tenderness.  Lungs were clear anteriorly and posteriorly with no axillary adenopathy  Abdominal: Soft. Bowel sounds are normal. He exhibits no mass. There is no tenderness. There is no rebound and no guarding.  No abdominal organ enlargement masses or bruits and no inguinal adenopathy  Musculoskeletal: Normal range of motion. He exhibits no edema.  Lymphadenopathy:    He has no cervical adenopathy.  Neurological: He is alert and oriented to person, place, and time. He has normal reflexes. No cranial nerve deficit.  The reflexes are diminished bilaterally but equal bilaterally.  Skin: Skin is warm and dry. No rash noted.  Psychiatric: He has a normal mood and affect. His behavior is normal. Judgment and thought content normal.  Nursing note and vitals reviewed.  BP (!) 142/83 (BP Location: Left Arm)   Pulse 92   Temp 97.4 F (36.3 C) (Oral)   Ht _0  (1.702 m)   Wt 206 lb (93.4 kg)   BMI 32.26 kg/m   [facility] reading (PRIMARY) by  Dr. Margo Aye films with results pending                                       Assessment & Plan:  1. Hyperlipidemia -Continue current treatment pending results of lab work. Continue aggressive therapeutic lifestyle changes - Lipid panel - CBC with Differential/Platelet  2. Essential hypertension -Continue to avoid salt work on getting weight down and continue current medication for blood pressure -Continue follow-up with Dr. Justin Mend - BMP8+EGFR -  CBC with Differential/Platelet - Hepatic function panel  3. Vitamin D deficiency -Continue current treatment pending results of lab work - CBC with Differential/Platelet - CBC with Differential/Platelet  4. Prostate cancer -Continue follow-up with urology  5. Weakness of both lower extremities -The patient has good pulses in both feet and we will check LS spine films and possibly get an MRI to further discern the reason why the weakness. He does have a lot of cervical disc disease.  6. Shortness of breath -The chest was clear and he had a chest x-ray in April that showed clearing of the pneumonia. We will refrain from doing any further chest x-rays unless his shortness of breath continues  7. Low back pain with sciatica, sciatica laterality unspecified, unspecified back pain laterality -LS spine films  8. Neuropathy (HCC) -LS spine films and possible MRI for further evaluation  No orders of the defined types were placed in this  encounter.  Patient Instructions                       Medicare Annual Wellness Visit  [facility] and the medical providers at [facility] strive to bring you the best medical care.  In doing so we not only want to address your current medical conditions and concerns but also to detect new conditions early and prevent illness, disease and health-related problems.    Medicare offers a yearly Wellness Visit which allows our clinical staff to assess your need for preventative services including immunizations, lifestyle education, counseling to decrease risk of preventable diseases and screening for fall risk and other medical concerns.    This visit is provided free of charge (no copay) for all Medicare recipients. The clinical pharmacists at [facility] have begun to conduct these Wellness Visits which will also include a thorough review of all your medications.    As you primary medical provider recommend that you  make an appointment for your Annual Wellness Visit if you have not done so already this year.  You may set up this appointment before you leave today or you may call back [phone]) and schedule an appointment.  Please make sure when you call that you mention that you are scheduling your Annual Wellness Visit with the clinical pharmacist so that the appointment may be made for the proper length of time.  ,   Continue current medications. Continue good therapeutic lifestyle changes which include good diet and exercise. Fall precautions discussed with patient. If an FOBT was given today- please return it to our front desk. If you are over 37 years old - you may need Prevnar 33 or the adult Pneumonia vaccine.   After your visit with [place] today you will receive a survey in the mail or online from [employer] regarding your care with [place]. Please take a moment to fill this out. Your feedback is very important to [place] as you can help [place] better understand your patient needs as well as improve your experience and satisfaction. WE CARE ABOUT YOU!!!  We will get x-rays of your low back and once these results have been returned we will consider further studies and a possible MRI so we can arrive at the cause of your low back pain and left-sided lower extremity paresthesias   Use warm wet compresses to the back 20 minutes 3 or 4 times daily continue to walk and avoid any heavy lifting and avoid falling  Arrie Senate MD

## 2016-08-24 LAB — BMP8+EGFR
BUN / CREAT RATIO: 22 (ref 10–24)
BUN: 36 mg/dL — AB (ref 8–27)
CO2: 23 mmol/L (ref 18–29)
CREATININE: 1.67 mg/dL — AB (ref 0.76–1.27)
Calcium: 10 mg/dL (ref 8.6–10.2)
Chloride: 102 mmol/L (ref 96–106)
GFR calc non Af Amer: 39 mL/min/{1.73_m2} — ABNORMAL LOW (ref >59)
GFR, EST AFRICAN AMERICAN: 45 mL/min/{1.73_m2} — AB (ref >59)
Glucose: 87 mg/dL (ref 65–99)
Potassium: 4.8 mmol/L (ref 3.5–5.2)
Sodium: 142 mmol/L (ref 134–144)

## 2016-08-24 LAB — CBC WITH DIFFERENTIAL/PLATELET
BASOS: 0 %
Basophils Absolute: 0 10*3/uL (ref 0.0–0.2)
EOS (ABSOLUTE): 0.3 10*3/uL (ref 0.0–0.4)
EOS: 3 %
HEMATOCRIT: 44.3 % (ref 37.5–51.0)
HEMOGLOBIN: 14.3 g/dL (ref 12.6–17.7)
Immature Grans (Abs): 0 10*3/uL (ref 0.0–0.1)
Immature Granulocytes: 0 %
LYMPHS ABS: 2.6 10*3/uL (ref 0.7–3.1)
Lymphs: 30 %
MCH: 28.5 pg (ref 26.6–33.0)
MCHC: 32.3 g/dL (ref 31.5–35.7)
MCV: 88 fL (ref 79–97)
MONOCYTES: 8 %
Monocytes Absolute: 0.7 10*3/uL (ref 0.1–0.9)
NEUTROS ABS: 5.1 10*3/uL (ref 1.4–7.0)
Neutrophils: 59 %
Platelets: 298 10*3/uL (ref 150–379)
RBC: 5.02 x10E6/uL (ref 4.14–5.80)
RDW: 13.6 % (ref 12.3–15.4)
WBC: 8.7 10*3/uL (ref 3.4–10.8)

## 2016-08-24 LAB — HEPATIC FUNCTION PANEL
ALK PHOS: 49 IU/L (ref 39–117)
ALT: 22 IU/L (ref 0–44)
AST: 25 IU/L (ref 0–40)
Albumin: 4.7 g/dL (ref 3.5–4.8)
Bilirubin Total: 0.5 mg/dL (ref 0.0–1.2)
Bilirubin, Direct: 0.19 mg/dL (ref 0.00–0.40)
Total Protein: 7.4 g/dL (ref 6.0–8.5)

## 2016-08-24 LAB — LIPID PANEL
CHOL/HDL RATIO: 2.1 ratio (ref 0.0–5.0)
Cholesterol, Total: 126 mg/dL (ref 100–199)
HDL: 60 mg/dL (ref >39)
LDL Calculated: 53 mg/dL (ref 0–99)
TRIGLYCERIDES: 64 mg/dL (ref 0–149)
VLDL Cholesterol Cal: 13 mg/dL (ref 5–40)

## 2016-08-24 LAB — VITAMIN D 25 HYDROXY (VIT D DEFICIENCY, FRACTURES): VIT D 25 HYDROXY: 36.7 ng/mL (ref 30.0–100.0)

## 2016-08-25 ENCOUNTER — Other Ambulatory Visit: Payer: Self-pay | Admitting: *Deleted

## 2016-08-25 DIAGNOSIS — M545 Low back pain: Secondary | ICD-10-CM

## 2016-08-25 DIAGNOSIS — R200 Anesthesia of skin: Secondary | ICD-10-CM

## 2016-08-25 DIAGNOSIS — R202 Paresthesia of skin: Principal | ICD-10-CM

## 2016-09-05 ENCOUNTER — Ambulatory Visit (HOSPITAL_COMMUNITY)
Admission: RE | Admit: 2016-09-05 | Discharge: 2016-09-05 | Disposition: A | Discharge status: Home / Self Care | Payer: Medicare Other | Source: Ambulatory Visit | Attending: Family Medicine | Admitting: Family Medicine

## 2016-09-05 DIAGNOSIS — M419 Scoliosis, unspecified: Secondary | ICD-10-CM | POA: Diagnosis not present

## 2016-09-05 DIAGNOSIS — M545 Low back pain: Secondary | ICD-10-CM | POA: Diagnosis not present

## 2016-09-05 DIAGNOSIS — M4806 Spinal stenosis, lumbar region: Secondary | ICD-10-CM | POA: Diagnosis not present

## 2016-09-05 DIAGNOSIS — M5136 Other intervertebral disc degeneration, lumbar region: Secondary | ICD-10-CM | POA: Insufficient documentation

## 2016-09-08 DIAGNOSIS — M549 Dorsalgia, unspecified: Secondary | ICD-10-CM | POA: Insufficient documentation

## 2016-09-11 DIAGNOSIS — M549 Dorsalgia, unspecified: Secondary | ICD-10-CM | POA: Diagnosis not present

## 2016-09-11 DIAGNOSIS — M4806 Spinal stenosis, lumbar region: Secondary | ICD-10-CM | POA: Diagnosis not present

## 2016-09-14 ENCOUNTER — Other Ambulatory Visit: Payer: Self-pay | Admitting: Neurosurgery

## 2016-09-14 DIAGNOSIS — M48061 Spinal stenosis, lumbar region without neurogenic claudication: Secondary | ICD-10-CM

## 2016-09-22 ENCOUNTER — Ambulatory Visit
Admission: RE | Admit: 2016-09-22 | Discharge: 2016-09-22 | Disposition: A | Discharge status: Home / Self Care | Payer: Medicare Other | Source: Ambulatory Visit | Attending: Neurosurgery | Admitting: Neurosurgery

## 2016-09-22 DIAGNOSIS — M48061 Spinal stenosis, lumbar region without neurogenic claudication: Secondary | ICD-10-CM

## 2016-09-22 DIAGNOSIS — M4806 Spinal stenosis, lumbar region: Secondary | ICD-10-CM | POA: Diagnosis not present

## 2016-09-22 MED ORDER — IOPAMIDOL (ISOVUE-M 200) INJECTION 41%
1.0000 mL | Freq: Once | INTRAMUSCULAR | Status: AC
  Administered 2016-09-22: 1 mL via EPIDURAL

## 2016-09-22 MED ORDER — METHYLPREDNISOLONE ACETATE 40 MG/ML INJ SUSP (RADIOLOG
120.0000 mg | Freq: Once | INTRAMUSCULAR | Status: AC
  Administered 2016-09-22: 120 mg via EPIDURAL

## 2016-09-22 NOTE — Discharge Instructions (Signed)

## 2016-10-10 ENCOUNTER — Ambulatory Visit (INDEPENDENT_AMBULATORY_CARE_PROVIDER_SITE_OTHER): Payer: Medicare Other

## 2016-10-10 ENCOUNTER — Encounter: Payer: Self-pay | Admitting: Family Medicine

## 2016-10-10 DIAGNOSIS — Z23 Encounter for immunization: Secondary | ICD-10-CM

## 2016-12-19 ENCOUNTER — Encounter (INDEPENDENT_AMBULATORY_CARE_PROVIDER_SITE_OTHER): Payer: Self-pay

## 2016-12-19 ENCOUNTER — Encounter: Payer: Self-pay | Admitting: Pediatrics

## 2016-12-19 ENCOUNTER — Ambulatory Visit (INDEPENDENT_AMBULATORY_CARE_PROVIDER_SITE_OTHER): Payer: Medicare Other | Admitting: Pediatrics

## 2016-12-19 VITALS — BP 148/76 | HR 91 | Temp 97.5°F | Ht 67.0 in | Wt 211.8 lb

## 2016-12-19 DIAGNOSIS — L853 Xerosis cutis: Secondary | ICD-10-CM

## 2016-12-19 DIAGNOSIS — L309 Dermatitis, unspecified: Secondary | ICD-10-CM | POA: Diagnosis not present

## 2016-12-19 NOTE — Progress Notes (Signed)
  Subjective:   [name], male    DOB: 10/06/1936, 80 y.o.   MRN: [id number] CC: Oral Swelling (x 4 days. Patient states x 3 weeks his lips has been peeling. Patient used lotrisone cream which didn't help so he started to use carmex. Patient states that last 4 days his lips have started swelling. )  HPI: Keith Wiley is a 80 y.o. male presenting for Oral Swelling (x 4 days. Patient states x 3 weeks his lips has been peeling. Patient used lotrisone cream which didn't help so he started to use carmex. Patient states that last 4 days his lips have started swelling. 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 Allergies and medications reviewed and updated. History  Smoking Status  . Former Smoker  . Packs/day: 1.00  . Years: 20.00  . Types: Cigarettes  . Quit date: 04/01/1977  Smokeless Tobacco  . Never Used   ROS: Per HPI   Objective:    BP (!) 148/76   Pulse 91   Temp 97.5 F (36.4 C) (Oral)   Ht 5\' 7"  (1.702 m)   Wt 211 lb 12.8 oz (96.1 kg)   BMI 33.17 kg/m   Wt Readings from Last 3 Encounters:  12/19/16 211 lb 12.8 oz (96.1 kg)  08/23/16 206 lb (93.4 kg)  03/30/16 201 lb (91.2 kg)    Gen: NAD, alert, cooperative with exam, NCAT EYES: EOMI, no conjunctival injection, or no icterus ENT:  OP without erythema, blurring of vermillon border with red, lips symmetric, slight swelling present lower lip Upper and lower lips cracked, peeling LYMPH: no cervical LAD CV: NRRR, normal S1/S2 Resp: CTABL, no wheezes, normal WOB Ext: No edema,  warm Neuro: Alert and oriented Skin: slightly dry flaking skin without breaks in skin, rash or excoriation along sides and lower back No redness or inflammation  Assessment & Plan:  Keith Wiley was seen today for oral swelling.  Diagnoses and all orders for this visit:  Lip licking dermatitis No tongue swelling/itching tingling, no other rash No new meds/foods/exposures other than the carmax Stop car max Use vasoline throughout the day on lips, do not lick lips If any worsening of symptoms needs to be seen  Xerosis Drink plenty of water  Use eucerin or cerave thick moisturizing cream in jar after showering  Follow up plan: As needed Assunta Found, MD [place]

## 2016-12-19 NOTE — Patient Instructions (Signed)
Stop carmax Use vasoline or aquaphor every couple of hours Do not lick your lips Let me know if not getting better

## 2016-12-27 ENCOUNTER — Encounter: Payer: Self-pay | Admitting: Family Medicine

## 2016-12-27 ENCOUNTER — Ambulatory Visit (INDEPENDENT_AMBULATORY_CARE_PROVIDER_SITE_OTHER): Payer: Medicare Other | Admitting: Family Medicine

## 2016-12-27 VITALS — BP 136/73 | HR 84 | Temp 97.2°F | Ht 67.0 in | Wt 207.0 lb

## 2016-12-27 DIAGNOSIS — E7800 Pure hypercholesterolemia, unspecified: Secondary | ICD-10-CM

## 2016-12-27 DIAGNOSIS — I129 Hypertensive chronic kidney disease with stage 1 through stage 4 chronic kidney disease, or unspecified chronic kidney disease: Secondary | ICD-10-CM | POA: Diagnosis not present

## 2016-12-27 DIAGNOSIS — L309 Dermatitis, unspecified: Secondary | ICD-10-CM

## 2016-12-27 DIAGNOSIS — N184 Chronic kidney disease, stage 4 (severe): Secondary | ICD-10-CM

## 2016-12-27 DIAGNOSIS — E559 Vitamin D deficiency, unspecified: Secondary | ICD-10-CM | POA: Diagnosis not present

## 2016-12-27 DIAGNOSIS — I10 Essential (primary) hypertension: Secondary | ICD-10-CM

## 2016-12-27 DIAGNOSIS — N183 Chronic kidney disease, stage 3 (moderate): Secondary | ICD-10-CM

## 2016-12-27 DIAGNOSIS — C61 Malignant neoplasm of prostate: Secondary | ICD-10-CM

## 2016-12-27 DIAGNOSIS — G629 Polyneuropathy, unspecified: Secondary | ICD-10-CM

## 2016-12-27 DIAGNOSIS — R29898 Other symptoms and signs involving the musculoskeletal system: Secondary | ICD-10-CM | POA: Diagnosis not present

## 2016-12-27 MED ORDER — CEPHALEXIN 500 MG PO CAPS: 500.0000 mg | ORAL_CAPSULE | Freq: Three times a day (TID) | ORAL | 0 refills | Status: DC

## 2016-12-27 NOTE — Progress Notes (Signed)
Subjective:    [name], male    DOB: 1936-06-12, 81 y.o.   MRN: [id number]  HPI Pt here for follow up and management of chronic medical problems which includes hyperlipidemia and hypertension. He is taking medications regularly. This patient has hypertension with chronic kidney disease. He has stage IV chronic kidney disease. He has a history of prostate cancer and is still being followed by the urologist. He has metabolic syndrome. The patient complains today of his lips peeling and swelling. The patient denies any chest pain or shortness of breath. He denies any problems with his intestinal tract including heartburn and nausea vomiting diarrhea blood in the stool or black tarry bowel movements. He continues to be followed by the urologist, Dr. Jeffie Pollock for his prostate cancer. He also mentions that he has some weakness in his legs and neuropathy and received a shot in his low back or this neuropathy and that was about 3 months ago and he is having more pain than he had even before the shot and more weakness. We discussed this with the patient and he understands he needs to call the neurosurgeon back for a follow-up visit for this. The swelling in the lower lip has been going on for about 4 weeks. He has not started any new medications. We will have the clinical pharmacist to review the medicines he is taking regularly to make sure that he is not become sensitive to those. We will also encourage him to get a milder toothpaste to use.   Patient Active Problem List   Diagnosis Date Noted  . Benign hypertension with chronic kidney disease, stage III 03/18/2015  . Allergic rhinitis due to pollen 03/18/2015  . Metabolic syndrome 27/51/7001  . Hypertension 05/28/2013  . Hyperlipidemia 05/28/2013  . Vitamin D deficiency 05/28/2013  . Chronic kidney disease (CKD), stage IV (severe) ([place]) 05/28/2013  . History of prostate cancer 05/28/2013  . Gout 05/28/2013   Outpatient Encounter  Prescriptions as of 12/27/2016  Medication Sig  . allopurinol (ZYLOPRIM) 100 MG tablet Take 1 tablet by mouth  daily  . amLODipine (NORVASC) 5 MG tablet Take 1 and 1/2 tablets by  mouth every day  . aspirin EC 81 MG tablet Take 81 mg by mouth daily.  . cholecalciferol (VITAMIN D) 1000 UNITS tablet Take 1,000 Units by mouth daily.  . clotrimazole-betamethasone (LOTRISONE) cream Apply topically 2 (two) times daily.  . fenofibrate 160 MG tablet TAKE ONE TABLET BY MOUTH ONCE DAILY  . fish oil-omega-3 fatty acids 1000 MG capsule Take 1 g by mouth 2 (two) times daily.  Marland Kitchen levothyroxine (SYNTHROID, LEVOTHROID) 50 MCG tablet Take 1 tablet by mouth  daily  . losartan-hydrochlorothiazide (HYZAAR) 100-25 MG tablet Take 1 tablet by mouth  daily  . pravastatin (PRAVACHOL) 80 MG tablet Take 1 tablet (80 mg total) by mouth daily.  Marland Kitchen triamcinolone cream (KENALOG) 0.1 % Apply 1 application topically 2 (two) times daily.  . vitamin C (ASCORBIC ACID) 500 MG tablet Take 500 mg by mouth daily.  . colchicine 0.6 MG tablet Take 2 tabs at onset, then 1 tab 1 hour later. (Patient not taking: Reported on 12/27/2016)   No facility-administered encounter medications on file as of 12/27/2016.       Review of Systems  Constitutional: Negative.   HENT: Negative.        Recent lip swelling and peeling  Eyes: Negative.   Respiratory: Negative.   Cardiovascular: Negative.   Gastrointestinal: Negative.  Endocrine: Negative.   Genitourinary: Negative.   Musculoskeletal: Negative.   Skin: Negative.   Allergic/Immunologic: Negative.   Neurological: Negative.   Hematological: Negative.   Psychiatric/Behavioral: Negative.        Objective:   Physical Exam  Constitutional: He is oriented to person, place, and time. He appears well-developed and well-nourished. No distress.  HENT:  Head: Normocephalic and atraumatic.  Right Ear: External ear normal.  Left Ear: External ear normal.  Nose: Nose normal.  Mouth/Throat:  Oropharynx is clear and moist. No oropharyngeal exudate.  The lower lip is swollen.  Eyes: Conjunctivae and EOM are normal. Pupils are equal, round, and reactive to light. Right eye exhibits no discharge. Left eye exhibits no discharge. No scleral icterus.  Neck: Normal range of motion. Neck supple. No thyromegaly present.  No anterior cervical adenopathy  Cardiovascular: Normal rate, regular rhythm, normal heart sounds and intact distal pulses.  Exam reveals no gallop and no friction rub.   No murmur heard. The heart is regular at 84/m  Pulmonary/Chest: Effort normal and breath sounds normal. No respiratory distress. He has no wheezes. He has no rales. He exhibits no tenderness.  Clear anteriorly and posteriorly and no axillary adenopathy  Abdominal: Soft. Bowel sounds are normal. He exhibits no mass. There is no tenderness. There is no rebound and no guarding.  No abdominal tenderness masses or organ enlargement and no bruits  Musculoskeletal: Normal range of motion. He exhibits no edema or tenderness.  Lymphadenopathy:    He has no cervical adenopathy.  Neurological: He is alert and oriented to person, place, and time. He has normal reflexes. No cranial nerve deficit.  Skin: Skin is warm and dry. No rash noted.  Psychiatric: He has a normal mood and affect. His behavior is normal. Judgment and thought content normal.  Nursing note and vitals reviewed.   BP 136/73 (BP Location: Right Arm)   Pulse 84   Temp 97.2 F (36.2 C) (Oral)   Ht 5' 7"  (1.702 m)   Wt 207 lb (93.9 kg)   BMI 32.42 kg/m        Assessment & Plan:  1. Vitamin D deficiency -Continue current treatment pending results of lab work - CBC with Differential/Platelet - VITAMIN D 25 Hydroxy (Vit-D Deficiency, Fractures)  2. Essential hypertension -The blood pressure is good today and he will continue with current treatment - CBC with Differential/Platelet - BMP8+EGFR - Hepatic function panel  3. Pure  hypercholesterolemia -Continue with aggressive therapeutic lifestyle changes including diet and exercise and current treatment pending results of lab work - CBC with Differential/Platelet - NMR, lipoprofile  4. Prostate cancer [facility]) -Follow-up with urology - CBC with Differential/Platelet  5. Neuropathy ([place]) -Follow-up with neurosurgery  6. Chronic kidney disease (CKD), stage IV (severe) (HCC) -Follow-up with nephrology  7. Benign hypertension with chronic kidney disease, stage III -Continue current medicines and follow-up with nephrologist  8. Lip licking dermatitis -Take Keflex 500 mg twice daily for 10 days and patient to call us back in one week  9. Weakness of both lower extremities -Follow-up with neurosurgery  Keflex 500 mg one twice daily for 10 days  Patient Instructions                       Medicare Annual Wellness Visit  [facility] and the medical providers at [facility] strive to bring you the best medical care.  In doing so we not only want to address your  current medical conditions and concerns but also to detect new conditions early and prevent illness, disease and health-related problems.    Medicare offers a yearly Wellness Visit which allows our clinical staff to assess your need for preventative services including immunizations, lifestyle education, counseling to decrease risk of preventable diseases and screening for fall risk and other medical concerns.    This visit is provided free of charge (no copay) for all Medicare recipients. The clinical pharmacists at [facility] have begun to conduct these Wellness Visits which will also include a thorough review of all your medications.    As you primary medical provider recommend that you make an appointment for your Annual Wellness Visit if you have not done so already this year.  You may set up this appointment before you leave today or you may call back ([phone])  and schedule an appointment.  Please make sure when you call that you mention that you are scheduling your Annual Wellness Visit with the clinical pharmacist so that the appointment may be made for the proper length of time.    Continue current medications. Continue good therapeutic lifestyle changes which include good diet and exercise. Fall precautions discussed with patient. If an FOBT was given today- please return it to our front desk. If you are over 27 years old - you may need Prevnar 5 or the adult Pneumonia vaccine.  **Flu shots are available--- please call and schedule a FLU-CLINIC appointment**  After your visit with [place] today you will receive a survey in the mail or online from [employer] regarding your care with [place]. Please take a moment to fill this out. Your feedback is very important to [place] as you can help [place] better understand your patient needs as well as improve your experience and satisfaction. WE CARE ABOUT YOU!!!  The patient will call us back in about a week and give [place] a progress report on how his lip swelling is doing at that time He will continue to follow-up with the urologist and the nephrologist He will call the neurosurgeon back up regarding his back and the weakness in his legs following a shot from 3 months ago. He should drink mostly cold fluids He should use any kind of teeth cleanser that is for sensitive teeth and gums. He can continue to use Vaseline on his lips.   Arrie Senate MD

## 2016-12-27 NOTE — Patient Instructions (Addendum)
Medicare Annual Wellness Visit  [facility] and the medical providers at [facility] strive to bring you the best medical care.  In doing so we not only want to address your current medical conditions and concerns but also to detect new conditions early and prevent illness, disease and health-related problems.    Medicare offers a yearly Wellness Visit which allows our clinical staff to assess your need for preventative services including immunizations, lifestyle education, counseling to decrease risk of preventable diseases and screening for fall risk and other medical concerns.    This visit is provided free of charge (no copay) for all Medicare recipients. The clinical pharmacists at [facility] have begun to conduct these Wellness Visits which will also include a thorough review of all your medications.    As you primary medical provider recommend that you make an appointment for your Annual Wellness Visit if you have not done so already this year.  You may set up this appointment before you leave today or you may call back ([phone]) and schedule an appointment.  Please make sure when you call that you mention that you are scheduling your Annual Wellness Visit with the clinical pharmacist so that the appointment may be made for the proper length of time.    Continue current medications. Continue good therapeutic lifestyle changes which include good diet and exercise. Fall precautions discussed with patient. If an FOBT was given today- please return it to our front desk. If you are over 38 years old - you may need Prevnar 79 or the adult Pneumonia vaccine.  **Flu shots are available--- please call and schedule a FLU-CLINIC appointment**  After your visit with [place] today you will receive a survey in the mail or online from [employer] regarding your care with [place]. Please take a moment to fill this out. Your feedback is very  important to [place] as you can help [place] better understand your patient needs as well as improve your experience and satisfaction. WE CARE ABOUT YOU!!!  The patient will call us back in about a week and give [place] a progress report on how his lip swelling is doing at that time He will continue to follow-up with the urologist and the nephrologist He will call the neurosurgeon back up regarding his back and the weakness in his legs following a shot from 3 months ago. He should drink mostly cold fluids He should use any kind of teeth cleanser that is for sensitive teeth and gums. He can continue to use Vaseline on his lips.

## 2016-12-28 LAB — CBC WITH DIFFERENTIAL/PLATELET
BASOS ABS: 0 10*3/uL (ref 0.0–0.2)
BASOS: 0 %
EOS (ABSOLUTE): 0.3 10*3/uL (ref 0.0–0.4)
Eos: 4 %
Hematocrit: 42.5 % (ref 37.5–51.0)
Hemoglobin: 13.7 g/dL (ref 13.0–17.7)
IMMATURE GRANS (ABS): 0 10*3/uL (ref 0.0–0.1)
IMMATURE GRANULOCYTES: 0 %
LYMPHS: 22 %
Lymphocytes Absolute: 1.9 10*3/uL (ref 0.7–3.1)
MCH: 29.1 pg (ref 26.6–33.0)
MCHC: 32.2 g/dL (ref 31.5–35.7)
MCV: 90 fL (ref 79–97)
MONOS ABS: 0.8 10*3/uL (ref 0.1–0.9)
Monocytes: 10 %
NEUTROS PCT: 64 %
Neutrophils Absolute: 5.5 10*3/uL (ref 1.4–7.0)
PLATELETS: 340 10*3/uL (ref 150–379)
RBC: 4.7 x10E6/uL (ref 4.14–5.80)
RDW: 13.2 % (ref 12.3–15.4)
WBC: 8.6 10*3/uL (ref 3.4–10.8)

## 2016-12-28 LAB — NMR, LIPOPROFILE
Cholesterol: 129 mg/dL (ref 100–199)
HDL Cholesterol by NMR: 50 mg/dL (ref >39)
HDL Particle Number: 36.6 umol/L (ref >=30.5)
LDL PARTICLE NUMBER: 795 nmol/L (ref <1000)
LDL Size: 20.3 nm (ref >20.5)
LDL-C: 62 mg/dL (ref 0–99)
LP-IR SCORE: 63 — AB (ref <=45)
Small LDL Particle Number: 454 nmol/L (ref <=527)
Triglycerides by NMR: 87 mg/dL (ref 0–149)

## 2016-12-28 LAB — HEPATIC FUNCTION PANEL
ALT: 21 IU/L (ref 0–44)
AST: 25 IU/L (ref 0–40)
Albumin: 4.2 g/dL (ref 3.5–4.8)
Alkaline Phosphatase: 54 IU/L (ref 39–117)
BILIRUBIN, DIRECT: 0.19 mg/dL (ref 0.00–0.40)
Bilirubin Total: 0.5 mg/dL (ref 0.0–1.2)
TOTAL PROTEIN: 7.3 g/dL (ref 6.0–8.5)

## 2016-12-28 LAB — BMP8+EGFR
BUN / CREAT RATIO: 15 (ref 10–24)
BUN: 26 mg/dL (ref 8–27)
CHLORIDE: 105 mmol/L (ref 96–106)
CO2: 24 mmol/L (ref 18–29)
Calcium: 9.9 mg/dL (ref 8.6–10.2)
Creatinine, Ser: 1.74 mg/dL — ABNORMAL HIGH (ref 0.76–1.27)
GFR calc non Af Amer: 37 mL/min/{1.73_m2} — ABNORMAL LOW (ref >59)
GFR, EST AFRICAN AMERICAN: 42 mL/min/{1.73_m2} — AB (ref >59)
GLUCOSE: 111 mg/dL — AB (ref 65–99)
Potassium: 4.6 mmol/L (ref 3.5–5.2)
SODIUM: 144 mmol/L (ref 134–144)

## 2016-12-28 LAB — VITAMIN D 25 HYDROXY (VIT D DEFICIENCY, FRACTURES): VIT D 25 HYDROXY: 34.3 ng/mL (ref 30.0–100.0)

## 2017-01-03 ENCOUNTER — Telehealth: Payer: Self-pay | Admitting: Family Medicine

## 2017-01-03 NOTE — Telephone Encounter (Signed)
lmtcb

## 2017-01-04 NOTE — Telephone Encounter (Signed)
Pt just wanted to give DWM and Jamie and update on his lips. He says they are much better.

## 2017-01-18 DIAGNOSIS — N2581 Secondary hyperparathyroidism of renal origin: Secondary | ICD-10-CM | POA: Diagnosis not present

## 2017-01-18 DIAGNOSIS — D631 Anemia in chronic kidney disease: Secondary | ICD-10-CM | POA: Diagnosis not present

## 2017-01-18 DIAGNOSIS — N183 Chronic kidney disease, stage 3 (moderate): Secondary | ICD-10-CM | POA: Diagnosis not present

## 2017-01-24 ENCOUNTER — Other Ambulatory Visit: Payer: Self-pay | Admitting: Family Medicine

## 2017-02-07 DIAGNOSIS — M48062 Spinal stenosis, lumbar region with neurogenic claudication: Secondary | ICD-10-CM | POA: Diagnosis not present

## 2017-02-07 DIAGNOSIS — I10 Essential (primary) hypertension: Secondary | ICD-10-CM | POA: Diagnosis not present

## 2017-04-11 ENCOUNTER — Other Ambulatory Visit: Payer: Medicare Other

## 2017-04-11 DIAGNOSIS — R351 Nocturia: Secondary | ICD-10-CM

## 2017-04-11 DIAGNOSIS — Z8546 Personal history of malignant neoplasm of prostate: Secondary | ICD-10-CM

## 2017-04-12 LAB — PSA, TOTAL AND FREE: PSA, Free: <0.01 ng/mL

## 2017-04-23 DIAGNOSIS — R3912 Poor urinary stream: Secondary | ICD-10-CM | POA: Diagnosis not present

## 2017-04-27 ENCOUNTER — Other Ambulatory Visit: Payer: Self-pay | Admitting: Family Medicine

## 2017-05-11 ENCOUNTER — Encounter: Payer: Self-pay | Admitting: Family Medicine

## 2017-05-11 ENCOUNTER — Ambulatory Visit (INDEPENDENT_AMBULATORY_CARE_PROVIDER_SITE_OTHER): Payer: Medicare Other | Admitting: Family Medicine

## 2017-05-11 VITALS — BP 139/73 | HR 76 | Temp 97.1°F | Ht 67.0 in | Wt 203.0 lb

## 2017-05-11 DIAGNOSIS — E039 Hypothyroidism, unspecified: Secondary | ICD-10-CM | POA: Diagnosis not present

## 2017-05-11 DIAGNOSIS — G8929 Other chronic pain: Secondary | ICD-10-CM

## 2017-05-11 DIAGNOSIS — M545 Low back pain: Secondary | ICD-10-CM | POA: Diagnosis not present

## 2017-05-11 DIAGNOSIS — E7800 Pure hypercholesterolemia, unspecified: Secondary | ICD-10-CM

## 2017-05-11 DIAGNOSIS — N184 Chronic kidney disease, stage 4 (severe): Secondary | ICD-10-CM

## 2017-05-11 DIAGNOSIS — E559 Vitamin D deficiency, unspecified: Secondary | ICD-10-CM | POA: Diagnosis not present

## 2017-05-11 DIAGNOSIS — K635 Polyp of colon: Secondary | ICD-10-CM | POA: Diagnosis not present

## 2017-05-11 DIAGNOSIS — I10 Essential (primary) hypertension: Secondary | ICD-10-CM

## 2017-05-11 DIAGNOSIS — C61 Malignant neoplasm of prostate: Secondary | ICD-10-CM | POA: Diagnosis not present

## 2017-05-11 NOTE — Addendum Note (Signed)
Addended by: Zannie Cove on: 05/11/2017 11:21 AM   Modules accepted: Orders

## 2017-05-11 NOTE — Patient Instructions (Addendum)
Medicare Annual Wellness Visit  [facility] and the medical providers at [facility] strive to bring you the best medical care.  In doing so we not only want to address your current medical conditions and concerns but also to detect new conditions early and prevent illness, disease and health-related problems.    Medicare offers a yearly Wellness Visit which allows our clinical staff to assess your need for preventative services including immunizations, lifestyle education, counseling to decrease risk of preventable diseases and screening for fall risk and other medical concerns.    This visit is provided free of charge (no copay) for all Medicare recipients. The clinical pharmacists at [facility] have begun to conduct these Wellness Visits which will also include a thorough review of all your medications.    As you primary medical provider recommend that you make an appointment for your Annual Wellness Visit if you have not done so already this year.  You may set up this appointment before you leave today or you may call back [phone]) and schedule an appointment.  Please make sure when you call that you mention that you are scheduling your Annual Wellness Visit with the clinical pharmacist so that the appointment may be made for the proper length of time.     Continue current medications. Continue good therapeutic lifestyle changes which include good diet and exercise. Fall precautions discussed with patient. If an FOBT was given today- please return it to our front desk. If you are over 36 years old - you may need Prevnar 56 or the adult Pneumonia vaccine.  **Flu shots are available--- please call and schedule a FLU-CLINIC appointment**  After your visit with [place] today you will receive a survey in the mail or online from [employer] regarding your care with [place]. Please take a moment to fill this out. Your feedback is very  important to [place] as you can help [place] better understand your patient needs as well as improve your experience and satisfaction. WE CARE ABOUT YOU!!!   Make sure that you get your next colonoscopy as planned sometime in 2019. If Dr. Perley Jain office does not call you you should call them. Follow-up with Dr. Jeffie Pollock as planned Get eye exam every 2 years and make sure that the ophthalmologist since [place] a copy of that record Return the FOBT card Follow-up with neurosurgery as needed Reduce and discontinue the use of the overhead fan if possible

## 2017-05-11 NOTE — Progress Notes (Signed)
Subjective:    [name], male    DOB: 07/10/1936, 81 y.o.   MRN: [id number]  HPI Pt here for follow up and management of chronic medical problems which includes hypertension and hyperlipidemia. He is taking medication regularly.This patient also has stage IV chronic kidney disease. He also has had prostate cancer. He has no specific complaints today. He does not need any refills. He is due to get lab work and also to return an FOBT. He is followed regularly by the urologist, Dr. Jeffie Pollock. His nephrologist is Dr. Justin Mend. On further discussion with the patient he does not see Dr. Justin Mend anymore and he has released him unless he develops more problems or worsening problems with his kidneys. The patient denies any chest pain or shortness of breath. He denies any trouble with his stomach including nausea vomiting diarrhea blood in the stool or black tarry bowel movements. As mentioned he sees Dr. Jeffie Pollock every 6-12 months and recently saw him in April. He does have a history of polyps and says he sees Dr. May God for this. He was told at the last colonoscopy that he would need another colonoscopy in 5 years and the patient thinks this would be sometime in 2019. He sees the eye doctor every 2 years and last saw him in 2017. The patient had been seeing Dr. Joya Salm who has recently retired. He is seeing another neurosurgeon if he continues to have problems with his back.    Patient Active Problem List   Diagnosis Date Noted  . Benign hypertension with chronic kidney disease, stage III 03/18/2015  . Allergic rhinitis due to pollen 03/18/2015  . Metabolic syndrome 00/71/2197  . Hypertension 05/28/2013  . Hyperlipidemia 05/28/2013  . Vitamin D deficiency 05/28/2013  . Chronic kidney disease (CKD), stage IV (severe) ([facility]) 05/28/2013  . History of prostate cancer 05/28/2013  . Gout 05/28/2013   Outpatient Encounter Prescriptions as of 05/11/2017  Medication Sig  . allopurinol (ZYLOPRIM) 100 MG  tablet TAKE 1 TABLET BY MOUTH  DAILY  . amLODipine (NORVASC) 5 MG tablet TAKE 1 AND 1/2 TABLETS BY  MOUTH EVERY DAY  . aspirin EC 81 MG tablet Take 81 mg by mouth daily.  . cephALEXin (KEFLEX) 500 MG capsule Take 1 capsule (500 mg total) by mouth 3 (three) times daily.  . cholecalciferol (VITAMIN D) 1000 UNITS tablet Take 1,000 Units by mouth daily.  . clotrimazole-betamethasone (LOTRISONE) cream Apply topically 2 (two) times daily.  . colchicine 0.6 MG tablet Take 2 tabs at onset, then 1 tab 1 hour later.  . fenofibrate 160 MG tablet TAKE 1 TABLET BY MOUTH ONCE DAILY  . fish oil-omega-3 fatty acids 1000 MG capsule Take 1 g by mouth 2 (two) times daily.  Marland Kitchen levothyroxine (SYNTHROID, LEVOTHROID) 50 MCG tablet TAKE 1 TABLET BY MOUTH  DAILY  . losartan-hydrochlorothiazide (HYZAAR) 100-25 MG tablet TAKE 1 TABLET BY MOUTH  DAILY  . pravastatin (PRAVACHOL) 80 MG tablet TAKE 1 TABLET BY MOUTH  DAILY  . triamcinolone cream (KENALOG) 0.1 % Apply 1 application topically 2 (two) times daily.  . vitamin C (ASCORBIC ACID) 500 MG tablet Take 500 mg by mouth daily.   No facility-administered encounter medications on file as of 05/11/2017.       Review of Systems  Constitutional: Negative.   HENT: Negative.   Eyes: Negative.   Respiratory: Negative.   Cardiovascular: Negative.   Gastrointestinal: Negative.   Endocrine: Negative.   Genitourinary: Negative.   Musculoskeletal:  Negative.   Skin: Negative.   Allergic/Immunologic: Negative.   Neurological: Negative.   Hematological: Negative.   Psychiatric/Behavioral: Negative.        Objective:   Physical Exam  Constitutional: He is oriented to person, place, and time. He appears well-developed and well-nourished. No distress.  The patient is pleasant and alert and looks much younger than his stated age of 15.  HENT:  Head: Normocephalic and atraumatic.  Right Ear: External ear normal.  Left Ear: External ear normal.  Mouth/Throat: Oropharynx  is clear and moist. No oropharyngeal exudate.  Nasal congestion bilaterally  Eyes: Conjunctivae and EOM are normal. Pupils are equal, round, and reactive to light. Right eye exhibits no discharge. Left eye exhibits no discharge. No scleral icterus.  Neck: Normal range of motion. Neck supple. No thyromegaly present.  No bruits thyromegaly or anterior cervical adenopathy  Cardiovascular: Normal rate, regular rhythm, normal heart sounds and intact distal pulses.   No murmur heard. The heart is regular at 60/m  Pulmonary/Chest: Effort normal and breath sounds normal. No respiratory distress. He has no wheezes. He has no rales. He exhibits no tenderness.  Lungs are clear anteriorly and posteriorly no axillary adenopathy  Abdominal: Soft. Bowel sounds are normal. He exhibits no mass. There is no tenderness. There is no rebound and no guarding.  No abdominal tenderness masses bruits or inguinal adenopathy  Genitourinary:  Genitourinary Comments: The patient is followed yearly by Dr. Jeffie Pollock  Musculoskeletal: Normal range of motion. He exhibits no edema.  Lymphadenopathy:    He has no cervical adenopathy.  Neurological: He is alert and oriented to person, place, and time. He has normal reflexes. No cranial nerve deficit.  Skin: Skin is warm and dry. No rash noted.  Psychiatric: He has a normal mood and affect. His behavior is normal. Judgment and thought content normal.  Nursing note and vitals reviewed.  BP 139/73 (BP Location: Left Arm)   Pulse 76   Temp 97.1 F (36.2 C) (Oral)   Ht _0  (1.702 m)   Wt 203 lb (92.1 kg)   BMI 31.79 kg/m         Assessment & Plan:  1. Essential hypertension -The blood pressure is good today and he will continue with current treatment - CBC with Differential/Platelet - BMP8+EGFR - Hepatic function panel  2. Vitamin D deficiency -Continue with vitamin D replacement pending results of lab work - CBC with Differential/Platelet - VITAMIN D 25 Hydroxy  (Vit-D Deficiency, Fractures)  3. Pure hypercholesterolemia -Continue with aggressive therapeutic lifestyle changes and current treatment for cholesterol and triglycerides pending results of lab work - CBC with Differential/Platelet - Lipid panel  4. Prostate cancer ([facility]) -Continue to follow-up regularly with Dr. Jeffie Pollock - CBC with Differential/Platelet  5. Chronic kidney disease (CKD), stage IV (severe) ([facility]) -This obviously has improved as the patient was told by Dr. Justin Mend he did not need to come back to see him anymore we will change the diagnosis with the blood work is returned. - CBC with Differential/Platelet - BMP8+EGFR  6. Polyp of colon, unspecified part of colon, unspecified type -Follow-up with Dr. Watt Climes the gastroenterologist as planned and patient thinks another colonoscopy is due in 2019.  Patient Instructions                       Medicare Annual Wellness Visit  [facility] and the medical providers at [facility] strive to bring you the best medical care.  In doing so we not only want to address your current medical conditions and concerns but also to detect new conditions early and prevent illness, disease and health-related problems.    Medicare offers a yearly Wellness Visit which allows our clinical staff to assess your need for preventative services including immunizations, lifestyle education, counseling to decrease risk of preventable diseases and screening for fall risk and other medical concerns.    This visit is provided free of charge (no copay) for all Medicare recipients. The clinical pharmacists at [facility] have begun to conduct these Wellness Visits which will also include a thorough review of all your medications.    As you primary medical provider recommend that you make an appointment for your Annual Wellness Visit if you have not done so already this year.  You may set up this appointment before you leave  today or you may call back ([phone]) and schedule an appointment.  Please make sure when you call that you mention that you are scheduling your Annual Wellness Visit with the clinical pharmacist so that the appointment may be made for the proper length of time.     Continue current medications. Continue good therapeutic lifestyle changes which include good diet and exercise. Fall precautions discussed with patient. If an FOBT was given today- please return it to our front desk. If you are over 36 years old - you may need Prevnar 55 or the adult Pneumonia vaccine.  **Flu shots are available--- please call and schedule a FLU-CLINIC appointment**  After your visit with [place] today you will receive a survey in the mail or online from [employer] regarding your care with [place]. Please take a moment to fill this out. Your feedback is very important to [place] as you can help [place] better understand your patient needs as well as improve your experience and satisfaction. WE CARE ABOUT YOU!!!   Make sure that you get your next colonoscopy as planned sometime in 2019. If Dr. Perley Jain office does not call you you should call them. Follow-up with Dr. Jeffie Pollock as planned Get eye exam every 2 years and make sure that the ophthalmologist since [place] a copy of that record Return the FOBT card Follow-up with neurosurgery as needed Reduce and discontinue the use of the overhead fan if possible  Arrie Senate MD

## 2017-05-12 LAB — CBC WITH DIFFERENTIAL/PLATELET
BASOS ABS: 0 10*3/uL (ref 0.0–0.2)
Basos: 0 %
EOS (ABSOLUTE): 0.2 10*3/uL (ref 0.0–0.4)
EOS: 2 %
HEMATOCRIT: 44.8 % (ref 37.5–51.0)
HEMOGLOBIN: 14.7 g/dL (ref 13.0–17.7)
IMMATURE GRANULOCYTES: 0 %
Immature Grans (Abs): 0 10*3/uL (ref 0.0–0.1)
Lymphocytes Absolute: 2.8 10*3/uL (ref 0.7–3.1)
Lymphs: 34 %
MCH: 29.2 pg (ref 26.6–33.0)
MCHC: 32.8 g/dL (ref 31.5–35.7)
MCV: 89 fL (ref 79–97)
MONOCYTES: 6 %
Monocytes Absolute: 0.5 10*3/uL (ref 0.1–0.9)
Neutrophils Absolute: 4.8 10*3/uL (ref 1.4–7.0)
Neutrophils: 58 %
Platelets: 324 10*3/uL (ref 150–379)
RBC: 5.03 x10E6/uL (ref 4.14–5.80)
RDW: 13.8 % (ref 12.3–15.4)
WBC: 8.4 10*3/uL (ref 3.4–10.8)

## 2017-05-12 LAB — BMP8+EGFR
BUN/Creatinine Ratio: 26 — ABNORMAL HIGH (ref 10–24)
BUN: 57 mg/dL — ABNORMAL HIGH (ref 8–27)
CALCIUM: 10.5 mg/dL — AB (ref 8.6–10.2)
CO2: 21 mmol/L (ref 18–29)
CREATININE: 2.2 mg/dL — AB (ref 0.76–1.27)
Chloride: 102 mmol/L (ref 96–106)
GFR calc Af Amer: 32 mL/min/{1.73_m2} — ABNORMAL LOW (ref >59)
GFR, EST NON AFRICAN AMERICAN: 27 mL/min/{1.73_m2} — AB (ref >59)
GLUCOSE: 95 mg/dL (ref 65–99)
Potassium: 4.7 mmol/L (ref 3.5–5.2)
SODIUM: 140 mmol/L (ref 134–144)

## 2017-05-12 LAB — HEPATIC FUNCTION PANEL
ALBUMIN: 4.8 g/dL (ref 3.5–4.8)
ALK PHOS: 56 IU/L (ref 39–117)
ALT: 18 IU/L (ref 0–44)
AST: 23 IU/L (ref 0–40)
BILIRUBIN TOTAL: 0.6 mg/dL (ref 0.0–1.2)
Bilirubin, Direct: 0.2 mg/dL (ref 0.00–0.40)
TOTAL PROTEIN: 7.5 g/dL (ref 6.0–8.5)

## 2017-05-12 LAB — THYROID PANEL WITH TSH
Free Thyroxine Index: 2.1 (ref 1.2–4.9)
T3 Uptake Ratio: 26 % (ref 24–39)
T4 TOTAL: 7.9 ug/dL (ref 4.5–12.0)
TSH: 6.95 u[IU]/mL — AB (ref 0.450–4.500)

## 2017-05-12 LAB — LIPID PANEL
CHOL/HDL RATIO: 2.8 ratio (ref 0.0–5.0)
Cholesterol, Total: 132 mg/dL (ref 100–199)
HDL: 47 mg/dL (ref >39)
LDL CALC: 70 mg/dL (ref 0–99)
Triglycerides: 74 mg/dL (ref 0–149)
VLDL Cholesterol Cal: 15 mg/dL (ref 5–40)

## 2017-05-12 LAB — VITAMIN D 25 HYDROXY (VIT D DEFICIENCY, FRACTURES): Vit D, 25-Hydroxy: 36.8 ng/mL (ref 30.0–100.0)

## 2017-05-14 ENCOUNTER — Other Ambulatory Visit: Payer: Self-pay | Admitting: *Deleted

## 2017-05-14 DIAGNOSIS — E039 Hypothyroidism, unspecified: Secondary | ICD-10-CM

## 2017-05-14 MED ORDER — LEVOTHYROXINE SODIUM 75 MCG PO TABS: 75.0000 ug | ORAL_TABLET | Freq: Every day | ORAL | 3 refills | Status: DC

## 2017-06-04 ENCOUNTER — Ambulatory Visit (INDEPENDENT_AMBULATORY_CARE_PROVIDER_SITE_OTHER): Payer: Medicare Other | Admitting: Physician Assistant

## 2017-06-04 ENCOUNTER — Encounter: Payer: Self-pay | Admitting: Physician Assistant

## 2017-06-04 VITALS — BP 132/68 | HR 79 | Temp 97.9°F | Ht 67.0 in | Wt 204.0 lb

## 2017-06-04 DIAGNOSIS — J069 Acute upper respiratory infection, unspecified: Secondary | ICD-10-CM

## 2017-06-04 MED ORDER — AZITHROMYCIN 250 MG PO TABS: ORAL_TABLET | ORAL | 0 refills | Status: DC

## 2017-06-04 NOTE — Progress Notes (Signed)
Subjective:     [name], male   DOB: July 08, 1936, 81 y.o.   MRN: [id number]  HPI Cough and congestion for 4-5 days Prev hx of pneumonia x3 with hospitalization  Review of Systems  Constitutional: Positive for fatigue. Negative for activity change, appetite change and chills.  HENT: Positive for congestion, postnasal drip, rhinorrhea and sinus pressure. Negative for sinus pain and sore throat.   Respiratory: Positive for cough. Negative for chest tightness, shortness of breath and wheezing.        Objective:   Physical Exam  Constitutional: He appears well-developed and well-nourished.  HENT:  Right Ear: External ear normal.  Left Ear: External ear normal.  Mouth/Throat: Oropharynx is clear and moist. No oropharyngeal exudate.  Neck: Neck supple.  Cardiovascular: Normal rate, regular rhythm and normal heart sounds.   No murmur heard. Pulmonary/Chest: Effort normal and breath sounds normal. No respiratory distress. He has no wheezes. He exhibits no tenderness.  Lymphadenopathy:    He has cervical adenopathy.  Nursing note and vitals reviewed.      Assessment:     1. Upper respiratory infection, acute        Plan:     Fluids Rest OTC meds for sx Given sig prev hx Zpack rx F/U prn

## 2017-06-04 NOTE — Patient Instructions (Signed)
Upper Respiratory Infection, Adult Most upper respiratory infections (URIs) are caused by a virus. A URI affects the nose, throat, and upper air passages. The most common type of URI is often called "the common cold." Follow these instructions at home:  Take medicines only as told by your doctor.  Gargle warm saltwater or take cough drops to comfort your throat as told by your doctor.  Use a warm mist humidifier or inhale steam from a shower to increase air moisture. This may make it easier to breathe.  Drink enough fluid to keep your pee (urine) clear or pale yellow.  Eat soups and other clear broths.  Have a healthy diet.  Rest as needed.  Go back to work when your fever is gone or your doctor says it is okay. ? You may need to stay home longer to avoid giving your URI to others. ? You can also wear a face mask and wash your hands often to prevent spread of the virus.  Use your inhaler more if you have asthma.  Do not use any tobacco products, including cigarettes, chewing tobacco, or electronic cigarettes. If you need help quitting, ask your doctor. Contact a doctor if:  You are getting worse, not better.  Your symptoms are not helped by medicine.  You have chills.  You are getting more short of breath.  You have brown or red mucus.  You have yellow or brown discharge from your nose.  You have pain in your face, especially when you bend forward.  You have a fever.  You have puffy (swollen) neck glands.  You have pain while swallowing.  You have white areas in the back of your throat. Get help right away if:  You have very bad or constant: ? Headache. ? Ear pain. ? Pain in your forehead, behind your eyes, and over your cheekbones (sinus pain). ? Chest pain.  You have long-lasting (chronic) lung disease and any of the following: ? Wheezing. ? Long-lasting cough. ? Coughing up blood. ? A change in your usual mucus.  You have a stiff neck.  You have  changes in your: ? Vision. ? Hearing. ? Thinking. ? Mood. This information is not intended to replace advice given to you by your health care provider. Make sure you discuss any questions you have with your health care provider. Document Released: 05/29/2008 Document Revised: 08/13/2016 Document Reviewed: 03/18/2014 Elsevier Interactive Patient Education  2018 [employer]  

## 2017-06-15 ENCOUNTER — Other Ambulatory Visit: Payer: Self-pay | Admitting: Family Medicine

## 2017-07-11 ENCOUNTER — Other Ambulatory Visit: Payer: Medicare Other

## 2017-07-11 DIAGNOSIS — E039 Hypothyroidism, unspecified: Secondary | ICD-10-CM

## 2017-07-11 DIAGNOSIS — Z1211 Encounter for screening for malignant neoplasm of colon: Secondary | ICD-10-CM | POA: Diagnosis not present

## 2017-07-12 LAB — THYROID PANEL WITH TSH
FREE THYROXINE INDEX: 1.8 (ref 1.2–4.9)
T3 UPTAKE RATIO: 24 % (ref 24–39)
T4 TOTAL: 7.6 ug/dL (ref 4.5–12.0)
TSH: 3.95 u[IU]/mL (ref 0.450–4.500)

## 2017-07-12 LAB — BMP8+EGFR
BUN/Creatinine Ratio: 18 (ref 10–24)
BUN: 35 mg/dL — AB (ref 8–27)
CALCIUM: 9.8 mg/dL (ref 8.6–10.2)
CHLORIDE: 106 mmol/L (ref 96–106)
CO2: 19 mmol/L — AB (ref 20–29)
Creatinine, Ser: 1.99 mg/dL — ABNORMAL HIGH (ref 0.76–1.27)
GFR calc non Af Amer: 31 mL/min/{1.73_m2} — ABNORMAL LOW (ref >59)
GFR, EST AFRICAN AMERICAN: 36 mL/min/{1.73_m2} — AB (ref >59)
Glucose: 92 mg/dL (ref 65–99)
POTASSIUM: 4.4 mmol/L (ref 3.5–5.2)
Sodium: 142 mmol/L (ref 134–144)

## 2017-07-12 LAB — FECAL OCCULT BLOOD, IMMUNOCHEMICAL: Fecal Occult Bld: NEGATIVE

## 2017-08-17 ENCOUNTER — Other Ambulatory Visit: Payer: Self-pay | Admitting: Family Medicine

## 2017-09-13 ENCOUNTER — Other Ambulatory Visit: Payer: Self-pay | Admitting: Family Medicine

## 2017-09-17 ENCOUNTER — Other Ambulatory Visit: Payer: Self-pay | Admitting: Family Medicine

## 2017-09-27 ENCOUNTER — Ambulatory Visit (INDEPENDENT_AMBULATORY_CARE_PROVIDER_SITE_OTHER): Payer: Medicare Other | Admitting: Family Medicine

## 2017-09-27 ENCOUNTER — Encounter: Payer: Self-pay | Admitting: Family Medicine

## 2017-09-27 VITALS — BP 134/67 | HR 75 | Temp 97.7°F | Ht 67.0 in | Wt 206.0 lb

## 2017-09-27 DIAGNOSIS — E7800 Pure hypercholesterolemia, unspecified: Secondary | ICD-10-CM | POA: Diagnosis not present

## 2017-09-27 DIAGNOSIS — I10 Essential (primary) hypertension: Secondary | ICD-10-CM | POA: Diagnosis not present

## 2017-09-27 DIAGNOSIS — G8929 Other chronic pain: Secondary | ICD-10-CM

## 2017-09-27 DIAGNOSIS — C61 Malignant neoplasm of prostate: Secondary | ICD-10-CM | POA: Diagnosis not present

## 2017-09-27 DIAGNOSIS — G629 Polyneuropathy, unspecified: Secondary | ICD-10-CM | POA: Diagnosis not present

## 2017-09-27 DIAGNOSIS — E559 Vitamin D deficiency, unspecified: Secondary | ICD-10-CM

## 2017-09-27 DIAGNOSIS — E039 Hypothyroidism, unspecified: Secondary | ICD-10-CM | POA: Diagnosis not present

## 2017-09-27 DIAGNOSIS — N184 Chronic kidney disease, stage 4 (severe): Secondary | ICD-10-CM

## 2017-09-27 DIAGNOSIS — Z23 Encounter for immunization: Secondary | ICD-10-CM | POA: Diagnosis not present

## 2017-09-27 DIAGNOSIS — M545 Low back pain: Secondary | ICD-10-CM

## 2017-09-27 DIAGNOSIS — M48062 Spinal stenosis, lumbar region with neurogenic claudication: Secondary | ICD-10-CM | POA: Diagnosis not present

## 2017-09-27 MED ORDER — LEVOTHYROXINE SODIUM 75 MCG PO TABS: 75.0000 ug | ORAL_TABLET | Freq: Every day | ORAL | 3 refills | Status: DC

## 2017-09-27 NOTE — Progress Notes (Signed)
Subjective:    [name], male    DOB: 1936/03/05, 81 y.o.   MRN: [id number]  HPI Pt here for follow up and management of chronic medical problems which includes hyperlipidemia and hypertension. He is taking medication regularly.The patient is doing well overall with no specific complaints. He is requesting mail order refills on several of his medications. He is due to get lab work today. He has not had the flu shot yet and prefers to wait. He is also due for his next colonoscopy in June 2019. This patient has hyperlipidemia hypertension and chronic kidney disease and is on thyroid replacement. He also has prostate cancer and is followed regularly by the urologist. The patient says that his biggest problem currently is his back pain that radiates down both legs with the left side being affected worse than the right side. He has seen neurosurgery in the past. They have tried shots 1 and this did not help. He has been told to come back if he gets worse. The patient says that with walking he has back pain with pain down his legs and his legs give way. He also says that he can be standing for a period of time and he has pain especially that goes down the left leg. The patient denies any chest pain or shortness of breath. He denies any change in his bowel habits including nausea vomiting diarrhea blood in the stool or black tarry bowel movements. He is passing his water without problems. I reminded him that he is due to get another colonoscopy in June 2019.     Patient Active Problem List   Diagnosis Date Noted  . Benign hypertension with chronic kidney disease, stage III ([place]) 03/18/2015  . Allergic rhinitis due to pollen 03/18/2015  . Metabolic syndrome 21/97/5883  . Hypertension 05/28/2013  . Hyperlipidemia 05/28/2013  . Vitamin D deficiency 05/28/2013  . Chronic kidney disease (CKD), stage IV (severe) ([place]) 05/28/2013  . History of prostate cancer 05/28/2013  . Gout 05/28/2013    Outpatient Encounter Prescriptions as of 09/27/2017  Medication Sig  . allopurinol (ZYLOPRIM) 100 MG tablet TAKE 1 TABLET BY MOUTH  DAILY  . amLODipine (NORVASC) 5 MG tablet TAKE 1 AND 1/2 TABLETS BY  MOUTH EVERY DAY  . aspirin EC 81 MG tablet Take 81 mg by mouth daily.  . cholecalciferol (VITAMIN D) 1000 UNITS tablet Take 1,000 Units by mouth daily.  . clotrimazole-betamethasone (LOTRISONE) cream Apply topically 2 (two) times daily.  . colchicine 0.6 MG tablet Take 2 tabs at onset, then 1 tab 1 hour later.  . fenofibrate 160 MG tablet TAKE 1 TABLET BY MOUTH ONCE DAILY  . fish oil-omega-3 fatty acids 1000 MG capsule Take 1 g by mouth 2 (two) times daily.  Marland Kitchen levothyroxine (SYNTHROID, LEVOTHROID) 75 MCG tablet Take 1 tablet (75 mcg total) by mouth daily before breakfast.  . losartan-hydrochlorothiazide (HYZAAR) 100-25 MG tablet TAKE 1 TABLET BY MOUTH  DAILY  . pravastatin (PRAVACHOL) 80 MG tablet TAKE 1 TABLET BY MOUTH  DAILY  . triamcinolone cream (KENALOG) 0.1 % Apply 1 application topically 2 (two) times daily.  . vitamin C (ASCORBIC ACID) 500 MG tablet Take 500 mg by mouth daily.  . [DISCONTINUED] azithromycin (ZITHROMAX Z-PAK) 250 MG tablet Take pack as directed  . [DISCONTINUED] cephALEXin (KEFLEX) 500 MG capsule Take 1 capsule (500 mg total) by mouth 3 (three) times daily.  . [DISCONTINUED] levothyroxine (SYNTHROID, LEVOTHROID) 50 MCG tablet TAKE 1 TABLET BY  MOUTH  DAILY   No facility-administered encounter medications on file as of 09/27/2017.      Review of Systems  Constitutional: Negative.   HENT: Negative.   Eyes: Negative.   Respiratory: Negative.   Cardiovascular: Negative.   Gastrointestinal: Negative.   Endocrine: Negative.   Genitourinary: Negative.   Musculoskeletal: Negative.   Skin: Negative.   Allergic/Immunologic: Negative.   Neurological: Negative.   Hematological: Negative.   Psychiatric/Behavioral: Negative.        Objective:   Physical Exam    Constitutional: He is oriented to person, place, and time. He appears well-developed and well-nourished. No distress.  The patient is pleasant and alert and concerned about his wife  HENT:  Head: Normocephalic and atraumatic.  Right Ear: External ear normal.  Left Ear: External ear normal.  Nose: Nose normal.  Mouth/Throat: Oropharynx is clear and moist. No oropharyngeal exudate.  Eyes: Pupils are equal, round, and reactive to light. Conjunctivae and EOM are normal. Right eye exhibits no discharge. Left eye exhibits no discharge. No scleral icterus.  Neck: Normal range of motion. Neck supple. No thyromegaly present.  No bruits thyromegaly or anterior cervical adenopathy  Cardiovascular: Normal rate, regular rhythm, normal heart sounds and intact distal pulses.   No murmur heard. The heart is regular at 72/m  Pulmonary/Chest: Effort normal and breath sounds normal. No respiratory distress. He has no wheezes. He has no rales. He exhibits no tenderness.  Clear anteriorly and posteriorly without axillary adenopathy  Abdominal: Soft. Bowel sounds are normal. He exhibits no mass. There is no tenderness. There is no rebound and no guarding.  Normal exam no liver or spleen enlargement no bruits and no inguinal adenopathy  Genitourinary: Penis normal.  Genitourinary Comments: Follow-up with urologist as planned on a yearly basis  Musculoskeletal: Normal range of motion. He exhibits no edema.  Leg raising is good bilaterally.  Lymphadenopathy:    He has no cervical adenopathy.  Neurological: He is alert and oriented to person, place, and time. He has normal reflexes. No cranial nerve deficit.  Skin: Skin is warm and dry. No rash noted.  Psychiatric: He has a normal mood and affect. His behavior is normal. Judgment and thought content normal.  Nursing note and vitals reviewed.  BP 134/67 (BP Location: Left Arm)   Pulse 75   Temp 97.7 F (36.5 C) (Oral)   Ht 5' 7"  (1.702 m)   Wt 206 lb (93.4  kg)   BMI 32.26 kg/m         Assessment & Plan:  1. Essential hypertension -The blood pressure is good today he will continue with current treatment and monitoring these readings also at home. - CBC with Differential/Platelet - BMP8+EGFR - Hepatic function panel  2. Vitamin D deficiency -Continue with current treatment pending results of lab work - CBC with Differential/Platelet - VITAMIN D 25 Hydroxy (Vit-D Deficiency, Fractures)  3. Pure hypercholesterolemia -Continue with current treatment pending results of lab work - CBC with Differential/Platelet - Lipid panel - Hepatic function panel  4. Prostate cancer [facility]) -Follow-up with urology in early 2019 - CBC with Differential/Platelet  5. Chronic kidney disease (CKD), stage IV (severe) ([facility]) -Continue all efforts to keep in blood pressure under good control and avoiding NSAIDs like ibuprofen and Aleve - CBC with Differential/Platelet  6. Hypothyroidism, unspecified type -Continue current treatment pending results of lab work - Thyroid Panel With TSH  7. Chronic midline low back pain without sciatica -Follow-up with neurosurgery  8. Neuropathy -  Follow-up with neurosurgery  9. Spinal stenosis of lumbar region with neurogenic claudication -Follow-up with neurosurgery  Meds ordered this encounter  Medications  . levothyroxine (SYNTHROID, LEVOTHROID) 75 MCG tablet    Sig: Take 1 tablet (75 mcg total) by mouth daily before breakfast.    Dispense:  90 tablet    Refill:  3   Patient Instructions                       Medicare Annual Wellness Visit  [facility] and the medical providers at [facility] strive to bring you the best medical care.  In doing so we not only want to address your current medical conditions and concerns but also to detect new conditions early and prevent illness, disease and health-related problems.    Medicare offers a yearly Wellness Visit which allows our clinical  staff to assess your need for preventative services including immunizations, lifestyle education, counseling to decrease risk of preventable diseases and screening for fall risk and other medical concerns.    This visit is provided free of charge (no copay) for all Medicare recipients. The clinical pharmacists at [facility] have begun to conduct these Wellness Visits which will also include a thorough review of all your medications.    As you primary medical provider recommend that you make an appointment for your Annual Wellness Visit if you have not done so already this year.  You may set up this appointment before you leave today or you may call back ([phone]) and schedule an appointment.  Please make sure when you call that you mention that you are scheduling your Annual Wellness Visit with the clinical pharmacist so that the appointment may be made for the proper length of time.     Continue current medications. Continue good therapeutic lifestyle changes which include good diet and exercise. Fall precautions discussed with patient. If an FOBT was given today- please return it to our front desk. If you are over 24 years old - you may need Prevnar 32 or the adult Pneumonia vaccine.  **Flu shots are available--- please call and schedule a FLU-CLINIC appointment**  After your visit with [place] today you will receive a survey in the mail or online from [employer] regarding your care with [place]. Please take a moment to fill this out. Your feedback is very important to [place] as you can help [place] better understand your patient needs as well as improve your experience and satisfaction. WE CARE ABOUT YOU!!!   Consider making an appointment with the neurosurgeon for follow-up and getting another injection in your back after you take care of your wife's colonoscopy  Arrie Senate MD

## 2017-09-27 NOTE — Patient Instructions (Addendum)
Medicare Annual Wellness Visit  [facility] and the medical providers at [facility] strive to bring you the best medical care.  In doing so we not only want to address your current medical conditions and concerns but also to detect new conditions early and prevent illness, disease and health-related problems.    Medicare offers a yearly Wellness Visit which allows our clinical staff to assess your need for preventative services including immunizations, lifestyle education, counseling to decrease risk of preventable diseases and screening for fall risk and other medical concerns.    This visit is provided free of charge (no copay) for all Medicare recipients. The clinical pharmacists at [place] [facility] have begun to conduct these Wellness Visits which will also include a thorough review of all your medications.    As you primary medical provider recommend that you make an appointment for your Annual Wellness Visit if you have not done so already this year.  You may set up this appointment before you leave today or you may call back ([phone]) and schedule an appointment.  Please make sure when you call that you mention that you are scheduling your Annual Wellness Visit with the clinical pharmacist so that the appointment may be made for the proper length of time.     Continue current medications. Continue good therapeutic lifestyle changes which include good diet and exercise. Fall precautions discussed with patient. If an FOBT was given today- please return it to our front desk. If you are over 29 years old - you may need Prevnar 66 or the adult Pneumonia vaccine.  **Flu shots are available--- please call and schedule a FLU-CLINIC appointment**  After your visit with [place] today you will receive a survey in the mail or online from [employer] regarding your care with [place]. Please take a moment to fill this out. Your feedback is very  important to [place] as you can help [place] better understand your patient needs as well as improve your experience and satisfaction. WE CARE ABOUT YOU!!!   Consider making an appointment with the neurosurgeon for follow-up and getting another injection in your back after you take care of your wife's colonoscopy

## 2017-09-28 ENCOUNTER — Other Ambulatory Visit: Payer: Self-pay | Admitting: *Deleted

## 2017-09-28 LAB — CBC WITH DIFFERENTIAL/PLATELET
BASOS: 0 %
Basophils Absolute: 0 10*3/uL (ref 0.0–0.2)
EOS (ABSOLUTE): 0.2 10*3/uL (ref 0.0–0.4)
EOS: 3 %
HEMATOCRIT: 42.3 % (ref 37.5–51.0)
Hemoglobin: 14.4 g/dL (ref 13.0–17.7)
Immature Grans (Abs): 0 10*3/uL (ref 0.0–0.1)
Immature Granulocytes: 0 %
LYMPHS ABS: 2.2 10*3/uL (ref 0.7–3.1)
Lymphs: 29 %
MCH: 30.1 pg (ref 26.6–33.0)
MCHC: 34 g/dL (ref 31.5–35.7)
MCV: 88 fL (ref 79–97)
MONOS ABS: 0.7 10*3/uL (ref 0.1–0.9)
Monocytes: 9 %
Neutrophils Absolute: 4.5 10*3/uL (ref 1.4–7.0)
Neutrophils: 59 %
Platelets: 311 10*3/uL (ref 150–379)
RBC: 4.79 x10E6/uL (ref 4.14–5.80)
RDW: 13 % (ref 12.3–15.4)
WBC: 7.7 10*3/uL (ref 3.4–10.8)

## 2017-09-28 LAB — LIPID PANEL
CHOL/HDL RATIO: 2.6 ratio (ref 0.0–5.0)
Cholesterol, Total: 125 mg/dL (ref 100–199)
HDL: 48 mg/dL (ref >39)
LDL Calculated: 62 mg/dL (ref 0–99)
Triglycerides: 77 mg/dL (ref 0–149)
VLDL Cholesterol Cal: 15 mg/dL (ref 5–40)

## 2017-09-28 LAB — THYROID PANEL WITH TSH
Free Thyroxine Index: 2.4 (ref 1.2–4.9)
T3 UPTAKE RATIO: 28 % (ref 24–39)
T4 TOTAL: 8.5 ug/dL (ref 4.5–12.0)
TSH: 5.11 u[IU]/mL — ABNORMAL HIGH (ref 0.450–4.500)

## 2017-09-28 LAB — BMP8+EGFR
BUN/Creatinine Ratio: 19 (ref 10–24)
BUN: 37 mg/dL — ABNORMAL HIGH (ref 8–27)
CO2: 23 mmol/L (ref 20–29)
Calcium: 10 mg/dL (ref 8.6–10.2)
Chloride: 105 mmol/L (ref 96–106)
Creatinine, Ser: 1.91 mg/dL — ABNORMAL HIGH (ref 0.76–1.27)
GFR, EST AFRICAN AMERICAN: 38 mL/min/{1.73_m2} — AB (ref >59)
GFR, EST NON AFRICAN AMERICAN: 33 mL/min/{1.73_m2} — AB (ref >59)
Glucose: 103 mg/dL — ABNORMAL HIGH (ref 65–99)
POTASSIUM: 4.2 mmol/L (ref 3.5–5.2)
SODIUM: 143 mmol/L (ref 134–144)

## 2017-09-28 LAB — HEPATIC FUNCTION PANEL
ALT: 23 IU/L (ref 0–44)
AST: 29 IU/L (ref 0–40)
Albumin: 4.7 g/dL (ref 3.5–4.8)
Alkaline Phosphatase: 50 IU/L (ref 39–117)
BILIRUBIN TOTAL: 0.6 mg/dL (ref 0.0–1.2)
Bilirubin, Direct: 0.19 mg/dL (ref 0.00–0.40)
Total Protein: 7.2 g/dL (ref 6.0–8.5)

## 2017-09-28 LAB — VITAMIN D 25 HYDROXY (VIT D DEFICIENCY, FRACTURES): VIT D 25 HYDROXY: 32.8 ng/mL (ref 30.0–100.0)

## 2017-09-28 MED ORDER — LEVOTHYROXINE SODIUM 88 MCG PO TABS: 88.0000 ug | ORAL_TABLET | Freq: Every day | ORAL | 3 refills | Status: DC

## 2017-12-20 ENCOUNTER — Other Ambulatory Visit: Payer: Self-pay | Admitting: Family Medicine

## 2018-02-15 ENCOUNTER — Encounter: Payer: Self-pay | Admitting: Family Medicine

## 2018-02-15 ENCOUNTER — Ambulatory Visit (INDEPENDENT_AMBULATORY_CARE_PROVIDER_SITE_OTHER): Payer: Medicare Other | Admitting: Family Medicine

## 2018-02-15 VITALS — BP 136/74 | HR 80 | Temp 97.0°F | Ht 67.0 in | Wt 205.0 lb

## 2018-02-15 DIAGNOSIS — E039 Hypothyroidism, unspecified: Secondary | ICD-10-CM

## 2018-02-15 DIAGNOSIS — C61 Malignant neoplasm of prostate: Secondary | ICD-10-CM

## 2018-02-15 DIAGNOSIS — N184 Chronic kidney disease, stage 4 (severe): Secondary | ICD-10-CM

## 2018-02-15 DIAGNOSIS — I10 Essential (primary) hypertension: Secondary | ICD-10-CM

## 2018-02-15 DIAGNOSIS — E7800 Pure hypercholesterolemia, unspecified: Secondary | ICD-10-CM | POA: Diagnosis not present

## 2018-02-15 DIAGNOSIS — E559 Vitamin D deficiency, unspecified: Secondary | ICD-10-CM

## 2018-02-15 MED ORDER — COLCHICINE 0.6 MG PO TABS: ORAL_TABLET | ORAL | 2 refills | Status: DC

## 2018-02-15 NOTE — Progress Notes (Signed)
Subjective:    [name], male    DOB: 1936-02-04, 82 y.o.   MRN: [id number]  HPI Pt here for follow up and management of chronic medical problems which includes hypertension and hyperlipidemia. He is taking medication regularly.  Patient is doing well with no specific complaints.  He is requesting a refill on his colchicine.  He sees Dr. Jeffie Pollock regularly for his prostate and PSA and that visit is coming up in April.  He will get lab work today.  This patient has a history of hyperlipidemia hypertension allergic rhinitis chronic kidney disease vitamin D deficiency and gout.  His weight is stable and his vital signs are stable.  He is due to get a colonoscopy this June with Dr. Watt Climes.  He has severe stage IV chronic kidney disease.  The patient denies any chest pain pressure tightness or palpitations.  He denies any shortness of breath.  He denies any change in his bowel habits trouble swallowing heartburn indigestion nausea vomiting diarrhea or blood in the stool.  He is due to have a repeat colonoscopy this June and he will make sure that this gets done.  He also is followed up regularly by the urologist because of seed implants for prostate cancer which was 9 or 10 years ago.  This visit is coming up in April.  We will make sure that the urologist gets a copy of the blood work that we are doing today.  He also indicates that his nephrologist told him he did not have to come back and see him unless he had worsening renal function and so we will make sure that he gets a copy of the blood work we do today also.  This is Dr. Justin Mend.    Patient Active Problem List   Diagnosis Date Noted  . Benign hypertension with chronic kidney disease, stage III ([place]) 03/18/2015  . Allergic rhinitis due to pollen 03/18/2015  . Metabolic syndrome 24/82/5003  . Hypertension 05/28/2013  . Hyperlipidemia 05/28/2013  . Vitamin D deficiency 05/28/2013  . Chronic kidney disease (CKD), stage IV (severe)  ([place]) 05/28/2013  . History of prostate cancer 05/28/2013  . Gout 05/28/2013   Outpatient Encounter Medications as of 02/15/2018  Medication Sig  . allopurinol (ZYLOPRIM) 100 MG tablet TAKE 1 TABLET BY MOUTH  DAILY  . amLODipine (NORVASC) 5 MG tablet TAKE 1 AND 1/2 TABLETS BY  MOUTH EVERY DAY  . aspirin EC 81 MG tablet Take 81 mg by mouth daily.  . cholecalciferol (VITAMIN D) 1000 UNITS tablet Take 1,000 Units by mouth daily.  . clotrimazole-betamethasone (LOTRISONE) cream Apply topically 2 (two) times daily.  . fenofibrate 160 MG tablet TAKE 1 TABLET BY MOUTH ONCE DAILY  . fish oil-omega-3 fatty acids 1000 MG capsule Take 1 g by mouth 2 (two) times daily.  Marland Kitchen levothyroxine (SYNTHROID, LEVOTHROID) 88 MCG tablet Take 1 tablet (88 mcg total) by mouth daily before breakfast.  . losartan-hydrochlorothiazide (HYZAAR) 100-25 MG tablet TAKE 1 TABLET BY MOUTH  DAILY  . pravastatin (PRAVACHOL) 80 MG tablet TAKE 1 TABLET BY MOUTH  DAILY  . triamcinolone cream (KENALOG) 0.1 % Apply 1 application topically 2 (two) times daily.  . vitamin C (ASCORBIC ACID) 500 MG tablet Take 500 mg by mouth daily.  . colchicine 0.6 MG tablet Take 2 tabs at onset, then 1 tab 1 hour later. (Patient not taking: Reported on 02/15/2018)   No facility-administered encounter medications on file as of 02/15/2018.  Review of Systems  Constitutional: Negative.   HENT: Negative.   Eyes: Negative.   Respiratory: Negative.   Cardiovascular: Negative.   Gastrointestinal: Negative.   Endocrine: Negative.   Genitourinary: Negative.   Musculoskeletal: Negative.   Skin: Negative.   Allergic/Immunologic: Negative.   Neurological: Negative.   Hematological: Negative.   Psychiatric/Behavioral: Negative.        Objective:   Physical Exam  Constitutional: He is oriented to person, place, and time. He appears well-developed and well-nourished. No distress.  The patient is pleasant and alert and feeling well.  HENT:  Head:  Normocephalic and atraumatic.  Right Ear: External ear normal.  Left Ear: External ear normal.  Nose: Nose normal.  Mouth/Throat: Oropharynx is clear and moist. No oropharyngeal exudate.  Eyes: Conjunctivae and EOM are normal. Pupils are equal, round, and reactive to light. Right eye exhibits no discharge. Left eye exhibits no discharge. No scleral icterus.  Neck: Normal range of motion. Neck supple. No thyromegaly present.  No bruits thyromegaly or anterior cervical adenopathy  Cardiovascular: Normal rate, regular rhythm, normal heart sounds and intact distal pulses.  No murmur heard. Heart is regular at 84/min with good pedal pulses  Pulmonary/Chest: Effort normal and breath sounds normal. No respiratory distress. He has no wheezes. He has no rales. He exhibits no tenderness.  Clear anteriorly and posteriorly and no axillary adenopathy and no chest wall masses  Abdominal: Soft. Bowel sounds are normal. He exhibits no mass. There is no tenderness. There is no rebound and no guarding.  No liver or spleen enlargement, no masses and no bruits  Genitourinary:  Genitourinary Comments: The patient has an upcoming visit with Dr. Jeffie Pollock, his urologist in April.  Musculoskeletal: Normal range of motion. He exhibits no edema.  Bilateral bunions and nail fungus  Lymphadenopathy:    He has no cervical adenopathy.  Neurological: He is alert and oriented to person, place, and time. He has normal reflexes. No cranial nerve deficit.  Skin: Skin is warm and dry. No rash noted.  Psychiatric: He has a normal mood and affect. His behavior is normal. Judgment and thought content normal.  Nursing note and vitals reviewed.   BP 136/74 (BP Location: Left Arm)   Pulse 80   Temp (!) 97 F (36.1 C) (Oral)   Ht 5' 7"  (1.702 m)   Wt 205 lb (93 kg)   BMI 32.11 kg/m        Assessment & Plan:  1.  Hypertension -The blood pressure is good today and the patient will continue with current treatment -  BMP8+EGFR - CBC with Differential/Platelet - Hepatic function panel  2. Vitamin D deficiency -Continue current treatment pending results of lab work - CBC with Differential/Platelet - VITAMIN D 25 Hydroxy (Vit-D Deficiency, Fractures)  3. Pure hypercholesterolemia -Continue aggressive therapeutic lifestyle changes and current treatment pending results of lab work - CBC with Differential/Platelet - Lipid panel  4. Prostate cancer ([place]) -Continue yearly follow-up with Dr. Irine Seal - CBC with Differential/Platelet  5. Chronic kidney disease (CKD), stage IV (severe) ([facility]) -Continue follow-up with Dr.Webb if any worsening in creatinine - BMP8+EGFR - CBC with Differential/Platelet  6. Hypothyroidism, unspecified type -Continue current treatment pending results of lab work - CBC with Differential/Platelet - Thyroid Panel With TSH  No orders of the defined types were placed in this encounter.  Patient Instructions  Medicare Annual Wellness Visit  [facility] and the medical providers at [facility] strive to bring you the best medical care.  In doing so we not only want to address your current medical conditions and concerns but also to detect new conditions early and prevent illness, disease and health-related problems.    Medicare offers a yearly Wellness Visit which allows our clinical staff to assess your need for preventative services including immunizations, lifestyle education, counseling to decrease risk of preventable diseases and screening for fall risk and other medical concerns.    This visit is provided free of charge (no copay) for all Medicare recipients. The clinical pharmacists at [facility] have begun to conduct these Wellness Visits which will also include a thorough review of all your medications.    As you primary medical provider recommend that you make an appointment for your Annual Wellness  Visit if you have not done so already this year.  You may set up this appointment before you leave today or you may call back ([phone]) and schedule an appointment.  Please make sure when you call that you mention that you are scheduling your Annual Wellness Visit with the clinical pharmacist so that the appointment may be made for the proper length of time.     Continue current medications. Continue good therapeutic lifestyle changes which include good diet and exercise. Fall precautions discussed with patient. If an FOBT was given today- please return it to our front desk. If you are over 24 years old - you may need Prevnar 1 or the adult Pneumonia vaccine.  **Flu shots are available--- please call and schedule a FLU-CLINIC appointment**  After your visit with [place] today you will receive a survey in the mail or online from [employer] regarding your care with [place]. Please take a moment to fill this out. Your feedback is very important to [place] as you can help [place] better understand your patient needs as well as improve your experience and satisfaction. WE CARE ABOUT YOU!!!   Continue to follow-up with urology and nephrology. Do not forget to remember that you may need a colonoscopy in June from Dr. Watt Climes Try to exercise regularly and drink plenty of water and fluids  Arrie Senate MD

## 2018-02-15 NOTE — Addendum Note (Signed)
Addended by: Zannie Cove on: 02/15/2018 08:49 AM   Modules accepted: Orders

## 2018-02-15 NOTE — Patient Instructions (Addendum)
Medicare Annual Wellness Visit  [facility] and the medical providers at [place] strive to bring you the best medical care.  In doing so we not only want to address your current medical conditions and concerns but also to detect new conditions early and prevent illness, disease and health-related problems.    Medicare offers a yearly Wellness Visit which allows our clinical staff to assess your need for preventative services including immunizations, lifestyle education, counseling to decrease risk of preventable diseases and screening for fall risk and other medical concerns.    This visit is provided free of charge (no copay) for all Medicare recipients. The clinical pharmacists at [facility] have begun to conduct these Wellness Visits which will also include a thorough review of all your medications.    As you primary medical provider recommend that you make an appointment for your Annual Wellness Visit if you have not done so already this year.  You may set up this appointment before you leave today or you may call back ([phone]) and schedule an appointment.  Please make sure when you call that you mention that you are scheduling your Annual Wellness Visit with the clinical pharmacist so that the appointment may be made for the proper length of time.     Continue current medications. Continue good therapeutic lifestyle changes which include good diet and exercise. Fall precautions discussed with patient. If an FOBT was given today- please return it to our front desk. If you are over 88 years old - you may need Prevnar 65 or the adult Pneumonia vaccine.  **Flu shots are available--- please call and schedule a FLU-CLINIC appointment**  After your visit with [place] today you will receive a survey in the mail or online from [employer] regarding your care with [place]. Please take a moment to fill this out. Your feedback is very  important to [place] as you can help [place] better understand your patient needs as well as improve your experience and satisfaction. WE CARE ABOUT YOU!!!   Continue to follow-up with urology and nephrology. Do not forget to remember that you may need a colonoscopy in June from Dr. Watt Climes Try to exercise regularly and drink plenty of water and fluids

## 2018-02-16 LAB — BMP8+EGFR
BUN/Creatinine Ratio: 14 (ref 10–24)
BUN: 29 mg/dL — ABNORMAL HIGH (ref 8–27)
CALCIUM: 10 mg/dL (ref 8.6–10.2)
CO2: 19 mmol/L — AB (ref 20–29)
CREATININE: 2.03 mg/dL — AB (ref 0.76–1.27)
Chloride: 107 mmol/L — ABNORMAL HIGH (ref 96–106)
GFR, EST AFRICAN AMERICAN: 35 mL/min/{1.73_m2} — AB (ref >59)
GFR, EST NON AFRICAN AMERICAN: 30 mL/min/{1.73_m2} — AB (ref >59)
Glucose: 99 mg/dL (ref 65–99)
Potassium: 4.6 mmol/L (ref 3.5–5.2)
Sodium: 143 mmol/L (ref 134–144)

## 2018-02-16 LAB — CBC WITH DIFFERENTIAL/PLATELET
BASOS: 0 %
Basophils Absolute: 0 10*3/uL (ref 0.0–0.2)
EOS (ABSOLUTE): 0.2 10*3/uL (ref 0.0–0.4)
EOS: 3 %
HEMATOCRIT: 42.5 % (ref 37.5–51.0)
HEMOGLOBIN: 13.9 g/dL (ref 13.0–17.7)
IMMATURE GRANS (ABS): 0 10*3/uL (ref 0.0–0.1)
IMMATURE GRANULOCYTES: 0 %
LYMPHS: 30 %
Lymphocytes Absolute: 2.1 10*3/uL (ref 0.7–3.1)
MCH: 28.9 pg (ref 26.6–33.0)
MCHC: 32.7 g/dL (ref 31.5–35.7)
MCV: 88 fL (ref 79–97)
MONOCYTES: 10 %
Monocytes Absolute: 0.7 10*3/uL (ref 0.1–0.9)
NEUTROS ABS: 4 10*3/uL (ref 1.4–7.0)
NEUTROS PCT: 57 %
PLATELETS: 310 10*3/uL (ref 150–379)
RBC: 4.81 x10E6/uL (ref 4.14–5.80)
RDW: 13.3 % (ref 12.3–15.4)
WBC: 7.1 10*3/uL (ref 3.4–10.8)

## 2018-02-16 LAB — HEPATIC FUNCTION PANEL
ALT: 24 IU/L (ref 0–44)
AST: 28 IU/L (ref 0–40)
Albumin: 4.5 g/dL (ref 3.5–4.8)
Alkaline Phosphatase: 50 IU/L (ref 39–117)
BILIRUBIN TOTAL: 0.4 mg/dL (ref 0.0–1.2)
Bilirubin, Direct: 0.17 mg/dL (ref 0.00–0.40)
Total Protein: 7.3 g/dL (ref 6.0–8.5)

## 2018-02-16 LAB — LIPID PANEL
CHOL/HDL RATIO: 2.4 ratio (ref 0.0–5.0)
Cholesterol, Total: 121 mg/dL (ref 100–199)
HDL: 51 mg/dL (ref >39)
LDL Calculated: 58 mg/dL (ref 0–99)
Triglycerides: 59 mg/dL (ref 0–149)
VLDL Cholesterol Cal: 12 mg/dL (ref 5–40)

## 2018-02-16 LAB — THYROID PANEL WITH TSH
Free Thyroxine Index: 2.3 (ref 1.2–4.9)
T3 UPTAKE RATIO: 28 % (ref 24–39)
T4, Total: 8.2 ug/dL (ref 4.5–12.0)
TSH: 4.35 u[IU]/mL (ref 0.450–4.500)

## 2018-02-16 LAB — VITAMIN D 25 HYDROXY (VIT D DEFICIENCY, FRACTURES): Vit D, 25-Hydroxy: 33.3 ng/mL (ref 30.0–100.0)

## 2018-02-18 ENCOUNTER — Other Ambulatory Visit: Payer: Self-pay | Admitting: *Deleted

## 2018-02-18 MED ORDER — ICOSAPENT ETHYL 1 G PO CAPS: 2.0000 g | ORAL_CAPSULE | Freq: Two times a day (BID) | ORAL | 1 refills | Status: DC

## 2018-05-14 ENCOUNTER — Other Ambulatory Visit: Payer: Medicare Other

## 2018-05-14 DIAGNOSIS — Z8546 Personal history of malignant neoplasm of prostate: Secondary | ICD-10-CM

## 2018-05-15 LAB — URINALYSIS, COMPLETE
BILIRUBIN UA: NEGATIVE
Glucose, UA: NEGATIVE
KETONES UA: NEGATIVE
LEUKOCYTES UA: NEGATIVE
Nitrite, UA: NEGATIVE
PH UA: 5.5 (ref 5.0–7.5)
RBC UA: NEGATIVE
Specific Gravity, UA: 1.017 (ref 1.005–1.030)
UUROB: 0.2 mg/dL (ref 0.2–1.0)

## 2018-05-15 LAB — MICROSCOPIC EXAMINATION

## 2018-05-15 LAB — PSA, TOTAL AND FREE
PSA, Free: <0.01 ng/mL
Prostate Specific Ag, Serum: <0.1 ng/mL (ref 0.0–4.0)

## 2018-06-10 DIAGNOSIS — R3912 Poor urinary stream: Secondary | ICD-10-CM | POA: Diagnosis not present

## 2018-06-19 ENCOUNTER — Other Ambulatory Visit: Payer: Self-pay | Admitting: Family Medicine

## 2018-06-26 ENCOUNTER — Ambulatory Visit (INDEPENDENT_AMBULATORY_CARE_PROVIDER_SITE_OTHER): Payer: Medicare Other

## 2018-06-26 ENCOUNTER — Encounter: Payer: Self-pay | Admitting: Family Medicine

## 2018-06-26 ENCOUNTER — Ambulatory Visit (INDEPENDENT_AMBULATORY_CARE_PROVIDER_SITE_OTHER): Payer: Medicare Other | Admitting: Family Medicine

## 2018-06-26 VITALS — BP 124/78 | HR 87 | Temp 96.9°F | Ht 67.0 in | Wt 203.0 lb

## 2018-06-26 DIAGNOSIS — E7800 Pure hypercholesterolemia, unspecified: Secondary | ICD-10-CM

## 2018-06-26 DIAGNOSIS — I10 Essential (primary) hypertension: Secondary | ICD-10-CM | POA: Diagnosis not present

## 2018-06-26 DIAGNOSIS — G629 Polyneuropathy, unspecified: Secondary | ICD-10-CM

## 2018-06-26 DIAGNOSIS — N184 Chronic kidney disease, stage 4 (severe): Secondary | ICD-10-CM

## 2018-06-26 DIAGNOSIS — I129 Hypertensive chronic kidney disease with stage 1 through stage 4 chronic kidney disease, or unspecified chronic kidney disease: Secondary | ICD-10-CM | POA: Diagnosis not present

## 2018-06-26 DIAGNOSIS — E559 Vitamin D deficiency, unspecified: Secondary | ICD-10-CM | POA: Diagnosis not present

## 2018-06-26 DIAGNOSIS — N183 Chronic kidney disease, stage 3 unspecified: Secondary | ICD-10-CM

## 2018-06-26 DIAGNOSIS — C61 Malignant neoplasm of prostate: Secondary | ICD-10-CM

## 2018-06-26 DIAGNOSIS — E039 Hypothyroidism, unspecified: Secondary | ICD-10-CM | POA: Diagnosis not present

## 2018-06-26 NOTE — Patient Instructions (Addendum)
Medicare Annual Wellness Visit  [facility] and the medical providers at [facility] strive to bring you the best medical care.  In doing so we not only want to address your current medical conditions and concerns but also to detect new conditions early and prevent illness, disease and health-related problems.    Medicare offers a yearly Wellness Visit which allows our clinical staff to assess your need for preventative services including immunizations, lifestyle education, counseling to decrease risk of preventable diseases and screening for fall risk and other medical concerns.    This visit is provided free of charge (no copay) for all Medicare recipients. The clinical pharmacists at [facility] have begun to conduct these Wellness Visits which will also include a thorough review of all your medications.    As you primary medical provider recommend that you make an appointment for your Annual Wellness Visit if you have not done so already this year.  You may set up this appointment before you leave today or you may call back ([phone]) and schedule an appointment.  Please make sure when you call that you mention that you are scheduling your Annual Wellness Visit with the clinical pharmacist so that the appointment may be made for the proper length of time.     Continue current medications. Continue good therapeutic lifestyle changes which include good diet and exercise. Fall precautions discussed with patient. If an FOBT was given today- please return it to our front desk. If you are over 27 years old - you may need Prevnar 9 or the adult Pneumonia vaccine.  **Flu shots are available--- please call and schedule a FLU-CLINIC appointment**  After your visit with [place] today you will receive a survey in the mail or online from [employer] regarding your care with [place]. Please take a moment to fill this out. Your feedback is very  important to [place] as you can help [place] better understand your patient needs as well as improve your experience and satisfaction. WE CARE ABOUT YOU!!!   Continue to follow-up with urology on a yearly basis because of the prostate cancer This summer use commonsense and do not over work in the heat and drink plenty of water and fluids to stay well-hydrated Walk and exercise regularly and eat healthy

## 2018-06-26 NOTE — Progress Notes (Signed)
Subjective:    [name], male    DOB: April 22, 1936, 82 y.o.   MRN: [id number]  HPI Pt here for follow up and management of chronic medical problems which includes hypertension and hyperlipidemia. He is taking medication regularly.  The patient is doing well today with no specific complaints.  He saw the urologist on June 19.  His PSA and prostate have been taken care of and the urologist is aware of this.  He is due to return in FOBT get a chest x-ray and have additional lab work done today.  His vital signs are stable.  He has had prostate cancer he has severe stage IV chronic kidney disease and hypertension along with hyperlipidemia.  The patient just saw the urologist, Dr. Jeffie Pollock and will see him again in a year.  His PSA was less than 0.1.  He denies any chest pain pressure tightness or shortness of breath.  He has been active other than with the heat he is slowed down some.  He has no trouble with his stomach including nausea vomiting diarrhea blood in the stool black tarry bowel movements or change in bowel habits.  He was told by the gastroenterologist that he needed a repeat colonoscopy in 5 years and we will follow up on that and make sure that this gets done if it is supposed to get done.  Patient is passing his water well and as mentioned got a good report from his urologist.  He gets his eye exams done every couple of years.    Patient Active Problem List   Diagnosis Date Noted  . Benign hypertension with chronic kidney disease, stage III ([place]) 03/18/2015  . Allergic rhinitis due to pollen 03/18/2015  . Metabolic syndrome 32/91/9166  . Hypertension 05/28/2013  . Hyperlipidemia 05/28/2013  . Vitamin D deficiency 05/28/2013  . Chronic kidney disease (CKD), stage IV (severe) ([place]) 05/28/2013  . History of prostate cancer 05/28/2013  . Gout 05/28/2013   Outpatient Encounter Medications as of 06/26/2018  Medication Sig  . allopurinol (ZYLOPRIM) 100 MG tablet TAKE 1  TABLET BY MOUTH  DAILY  . amLODipine (NORVASC) 5 MG tablet TAKE 1 AND 1/2 TABLETS BY  MOUTH EVERY DAY  . aspirin EC 81 MG tablet Take 81 mg by mouth daily.  . cholecalciferol (VITAMIN D) 1000 UNITS tablet Take 1,000 Units by mouth daily.  . clotrimazole-betamethasone (LOTRISONE) cream Apply topically 2 (two) times daily.  . fenofibrate 160 MG tablet TAKE 1 TABLET BY MOUTH ONCE DAILY  . fish oil-omega-3 fatty acids 1000 MG capsule Take 1 g by mouth 2 (two) times daily.  Marland Kitchen levothyroxine (SYNTHROID, LEVOTHROID) 88 MCG tablet Take 1 tablet (88 mcg total) by mouth daily before breakfast.  . losartan-hydrochlorothiazide (HYZAAR) 100-25 MG tablet TAKE 1 TABLET BY MOUTH  DAILY  . pravastatin (PRAVACHOL) 80 MG tablet TAKE 1 TABLET BY MOUTH  DAILY  . triamcinolone cream (KENALOG) 0.1 % Apply 1 application topically 2 (two) times daily.  . vitamin C (ASCORBIC ACID) 500 MG tablet Take 500 mg by mouth daily.  . [DISCONTINUED] colchicine 0.6 MG tablet Take 2 tabs at onset, then 1 tab 1 hour later.  . [DISCONTINUED] Icosapent Ethyl (VASCEPA) 1 g CAPS Take 2 capsules (2 g total) by mouth 2 (two) times daily.   No facility-administered encounter medications on file as of 06/26/2018.      Review of Systems  Constitutional: Negative.   HENT: Negative.   Eyes: Negative.   Respiratory:  Negative.   Cardiovascular: Negative.   Gastrointestinal: Negative.   Endocrine: Negative.   Genitourinary: Negative.   Musculoskeletal: Negative.   Skin: Negative.   Allergic/Immunologic: Negative.   Neurological: Negative.   Hematological: Negative.   Psychiatric/Behavioral: Negative.        Objective:   Physical Exam  Constitutional: He is oriented to person, place, and time. He appears well-developed and well-nourished. No distress.  Patient is pleasant calm and relaxed and feeling well overall.  He does not go back to see the nephrologist unless there is a worsening in his kidney function.  HENT:  Head:  Normocephalic and atraumatic.  Right Ear: External ear normal.  Left Ear: External ear normal.  Mouth/Throat: Oropharynx is clear and moist. No oropharyngeal exudate.  Nasal turbinate congestion bilaterally.  Patient should continue to use Flonase and nasal saline and avoid irritating environments  Eyes: Pupils are equal, round, and reactive to light. Conjunctivae and EOM are normal. Right eye exhibits no discharge. Left eye exhibits no discharge. No scleral icterus.  Gets His eye exam every 2-3  Neck: Normal range of motion. Neck supple. No thyromegaly present.  No bruits thyromegaly or anterior cervical adenopathy  Cardiovascular: Normal rate, regular rhythm, normal heart sounds and intact distal pulses.  No murmur heard. Heart is regular at 72/min with good pedal pulses and no murmurs  Pulmonary/Chest: Effort normal and breath sounds normal. He has no wheezes. He has no rales. He exhibits no tenderness.  Clear anteriorly and posteriorly, no axillary adenopathy or chest wall masses  Abdominal: Soft. Bowel sounds are normal. He exhibits no mass. There is no tenderness.  No liver or spleen enlargement masses or bruits or inguinal adenopathy  Genitourinary:  Genitourinary Comments: This exam is done by Dr. Jeffie Pollock yearly and this is just been completed recently.  The PSA remains less than 0.1.  Musculoskeletal: Normal range of motion. He exhibits no edema or deformity.  Lymphadenopathy:    He has no cervical adenopathy.  Neurological: He is alert and oriented to person, place, and time. He has normal reflexes. No cranial nerve deficit.  Skin: Skin is warm and dry. No rash noted.  Psychiatric: He has a normal mood and affect. His behavior is normal. Judgment and thought content normal.  Mood affect and behavior all normal.  Nursing note and vitals reviewed.  BP 124/78 (BP Location: Left Arm)   Pulse 87   Temp (!) 96.9 F (36.1 C) (Oral)   Ht 5' 7"  (1.702 m)   Wt 203 lb (92.1 kg)   BMI  31.79 kg/m    Chest x-ray with results pending===     Assessment & Plan:  1. Essential hypertension -The blood pressure is good today along with the home readings he brought and he will continue with current treatment - BMP8+EGFR - CBC with Differential/Platelet - Hepatic function panel - DG Chest 2 View; Future  2. Vitamin D deficiency -Continue with current treatment pending results of lab work - CBC with Differential/Platelet - VITAMIN D 25 Hydroxy (Vit-D Deficiency, Fractures)  3. Prostate cancer ([place]) -Continue to follow-up yearly with the urologist - CBC with Differential/Platelet  4. Pure hypercholesterolemia -Continue current treatment and as aggressive therapeutic lifestyle changes as possible including diet and exercise - CBC with Differential/Platelet - Lipid panel - DG Chest 2 View; Future  5. Hypothyroidism, unspecified type -10 you current treatment pending the results of lab work - CBC with Differential/Platelet - Thyroid Panel With TSH  6. Chronic kidney disease (CKD),  stage IV (severe) ([place]) -Continue to avoid NSAIDs stay well-hydrated and keep blood pressure under the best control possible and we will refer back to the nephrologist if anything changes and that nephrologist was Dr. Edrick Oh.  He indicates that as long as everything is stable with lab work that we do he does not need to see him back. - BMP8+EGFR - CBC with Differential/Platelet  7. Neuropathy -No complaints with neuropathy today.  8. Benign hypertension with chronic kidney disease, stage III ([place]) -Continue to monitor BMP regularly and avoid NSAIDs  9. Serum calcium elevated -BMP  No orders of the defined types were placed in this encounter.  Patient Instructions                       Medicare Annual Wellness Visit  [facility] and the medical providers at [facility] strive to bring you the best medical care.  In doing so we not only want to address your  current medical conditions and concerns but also to detect new conditions early and prevent illness, disease and health-related problems.    Medicare offers a yearly Wellness Visit which allows our clinical staff to assess your need for preventative services including immunizations, lifestyle education, counseling to decrease risk of preventable diseases and screening for fall risk and other medical concerns.    This visit is provided free of charge (no copay) for all Medicare recipients. The clinical pharmacists at [facility] have begun to conduct these Wellness Visits which will also include a thorough review of all your medications.    As you primary medical provider recommend that you make an appointment for your Annual Wellness Visit if you have not done so already this year.  You may set up this appointment before you leave today or you may call back ([phone]) and schedule an appointment.  Please make sure when you call that you mention that you are scheduling your Annual Wellness Visit with the clinical pharmacist so that the appointment may be made for the proper length of time.     Continue current medications. Continue good therapeutic lifestyle changes which include good diet and exercise. Fall precautions discussed with patient. If an FOBT was given today- please return it to our front desk. If you are over 84 years old - you may need Prevnar 55 or the adult Pneumonia vaccine.  **Flu shots are available--- please call and schedule a FLU-CLINIC appointment**  After your visit with [place] today you will receive a survey in the mail or online from [employer] regarding your care with [place]. Please take a moment to fill this out. Your feedback is very important to [place] as you can help [place] better understand your patient needs as well as improve your experience and satisfaction. WE CARE ABOUT YOU!!!   Continue to follow-up with urology on a yearly basis because of the prostate  cancer This summer use commonsense and do not over work in the heat and drink plenty of water and fluids to stay well-hydrated Walk and exercise regularly and eat healthy  Arrie Senate MD

## 2018-06-27 LAB — CBC WITH DIFFERENTIAL/PLATELET
Basophils Absolute: 0 10*3/uL (ref 0.0–0.2)
Basos: 1 %
EOS (ABSOLUTE): 0.2 10*3/uL (ref 0.0–0.4)
Eos: 3 %
HEMOGLOBIN: 14.4 g/dL (ref 13.0–17.7)
Hematocrit: 43.8 % (ref 37.5–51.0)
IMMATURE GRANULOCYTES: 0 %
Immature Grans (Abs): 0 10*3/uL (ref 0.0–0.1)
Lymphocytes Absolute: 2.3 10*3/uL (ref 0.7–3.1)
Lymphs: 27 %
MCH: 29.1 pg (ref 26.6–33.0)
MCHC: 32.9 g/dL (ref 31.5–35.7)
MCV: 89 fL (ref 79–97)
MONOCYTES: 8 %
Monocytes Absolute: 0.7 10*3/uL (ref 0.1–0.9)
NEUTROS PCT: 61 %
Neutrophils Absolute: 5.4 10*3/uL (ref 1.4–7.0)
Platelets: 335 10*3/uL (ref 150–450)
RBC: 4.94 x10E6/uL (ref 4.14–5.80)
RDW: 13.8 % (ref 12.3–15.4)
WBC: 8.8 10*3/uL (ref 3.4–10.8)

## 2018-06-27 LAB — HEPATIC FUNCTION PANEL
ALK PHOS: 53 IU/L (ref 39–117)
ALT: 21 IU/L (ref 0–44)
AST: 25 IU/L (ref 0–40)
Albumin: 4.7 g/dL (ref 3.5–4.7)
BILIRUBIN TOTAL: 0.5 mg/dL (ref 0.0–1.2)
Bilirubin, Direct: 0.18 mg/dL (ref 0.00–0.40)
Total Protein: 7.6 g/dL (ref 6.0–8.5)

## 2018-06-27 LAB — THYROID PANEL WITH TSH
Free Thyroxine Index: 2.5 (ref 1.2–4.9)
T3 UPTAKE RATIO: 30 % (ref 24–39)
T4, Total: 8.4 ug/dL (ref 4.5–12.0)
TSH: 2.87 u[IU]/mL (ref 0.450–4.500)

## 2018-06-27 LAB — LIPID PANEL
CHOL/HDL RATIO: 2.6 ratio (ref 0.0–5.0)
CHOLESTEROL TOTAL: 117 mg/dL (ref 100–199)
HDL: 45 mg/dL (ref >39)
LDL CALC: 59 mg/dL (ref 0–99)
TRIGLYCERIDES: 66 mg/dL (ref 0–149)
VLDL Cholesterol Cal: 13 mg/dL (ref 5–40)

## 2018-06-27 LAB — BMP8+EGFR
BUN/Creatinine Ratio: 16 (ref 10–24)
BUN: 35 mg/dL — AB (ref 8–27)
CALCIUM: 10 mg/dL (ref 8.6–10.2)
CO2: 21 mmol/L (ref 20–29)
CREATININE: 2.17 mg/dL — AB (ref 0.76–1.27)
Chloride: 109 mmol/L — ABNORMAL HIGH (ref 96–106)
GFR calc Af Amer: 32 mL/min/{1.73_m2} — ABNORMAL LOW (ref >59)
GFR calc non Af Amer: 28 mL/min/{1.73_m2} — ABNORMAL LOW (ref >59)
GLUCOSE: 102 mg/dL — AB (ref 65–99)
Potassium: 4.8 mmol/L (ref 3.5–5.2)
Sodium: 145 mmol/L — ABNORMAL HIGH (ref 134–144)

## 2018-06-27 LAB — VITAMIN D 25 HYDROXY (VIT D DEFICIENCY, FRACTURES): Vit D, 25-Hydroxy: 41.3 ng/mL (ref 30.0–100.0)

## 2018-07-09 ENCOUNTER — Ambulatory Visit (INDEPENDENT_AMBULATORY_CARE_PROVIDER_SITE_OTHER): Payer: Medicare Other | Admitting: *Deleted

## 2018-07-09 VITALS — BP 133/76 | HR 80 | Temp 97.0°F | Ht 67.0 in | Wt 207.0 lb

## 2018-07-09 DIAGNOSIS — Z0000 Encounter for general adult medical examination without abnormal findings: Secondary | ICD-10-CM

## 2018-07-09 NOTE — Progress Notes (Signed)
Subjective:   Keith Wiley is a 82 y.o. male who presents for an Initial Medicare Annual Wellness Visit. He is retired from [employer] and also worked many years at [employer]. He enjoys car shows, races and riding his trike. He doesn't get in any exercise, but does do a lot of stretching each day. He states that his diet is non very healthy and that he usually gets in 2 meals a day. He attends church regularly. He lives at home with his wife, Keith Wiley. He has 1 daughter, 1 granddaughter and 1 great-grandson. He does not have any pets and fall risks were discussed today. He states that his health is about the same as it was a year ago.      Objective:    Today's Vitals   07/09/18 1107  BP: 133/76  Pulse: 80  Temp: (!) 97 F (36.1 C)  TempSrc: Oral  Weight: 207 lb (93.9 kg)  Height: 5\' 7"  (1.702 m)   Body mass index is 32.42 kg/m.  Advanced Directives 07/09/2018 05/14/2012 05/14/2012 05/07/2012  Does Patient Have a Medical Advance Directive? No Patient does not have advance directive Patient does not have advance directive -  Would patient like information on creating a medical advance directive? Yes (MAU/Ambulatory/Procedural Areas - Information given) - - -  Pre-existing out of facility DNR order (yellow form or pink MOST form) - - - No    Current Medications (verified) Outpatient Encounter Medications as of 07/09/2018  Medication Sig  . allopurinol (ZYLOPRIM) 100 MG tablet TAKE 1 TABLET BY MOUTH  DAILY  . amLODipine (NORVASC) 5 MG tablet TAKE 1 AND 1/2 TABLETS BY  MOUTH EVERY DAY  . aspirin EC 81 MG tablet Take 81 mg by mouth daily.  . cholecalciferol (VITAMIN D) 1000 UNITS tablet Take 1,000 Units by mouth daily.  . fenofibrate 160 MG tablet TAKE 1 TABLET BY MOUTH ONCE DAILY  . fish oil-omega-3 fatty acids 1000 MG capsule Take 1 g by mouth 2 (two) times daily.  Marland Kitchen levothyroxine (SYNTHROID, LEVOTHROID) 88 MCG tablet Take 1 tablet (88 mcg total) by mouth daily before breakfast.  .  losartan-hydrochlorothiazide (HYZAAR) 100-25 MG tablet TAKE 1 TABLET BY MOUTH  DAILY  . pravastatin (PRAVACHOL) 80 MG tablet TAKE 1 TABLET BY MOUTH  DAILY  . triamcinolone cream (KENALOG) 0.1 % Apply 1 application topically 2 (two) times daily.  . vitamin C (ASCORBIC ACID) 500 MG tablet Take 500 mg by mouth daily.  . [DISCONTINUED] clotrimazole-betamethasone (LOTRISONE) cream Apply topically 2 (two) times daily.   No facility-administered encounter medications on file as of 07/09/2018.     Allergies (verified) Augmentin [amoxicillin-pot clavulanate]   History: Past Medical History:  Diagnosis Date  . Cancer ([place])    prostrate  . Chronic kidney disease    kidney stones and 1 smaller kidney, history of a kidney blockage  . Gout   . Hyperlipidemia   . Hypertension    dr Keith Wiley  pcp  . Hypothyroidism    Past Surgical History:  Procedure Laterality Date  . BACK SURGERY     cervical   1989, 2011  . CARPAL TUNNEL RELEASE    . HERNIA REPAIR    . prostrate     seeds inplant   2009   Family History  Problem Relation Age of Onset  . Heart attack Father   . Cancer Brother        unknown origin  . Obesity Maternal Grandmother   . Cancer Paternal  Grandfather        possible -- growth kidney   . Anesthesia problems Neg Hx    Social History   Socioeconomic History  . Marital status: Married    Spouse name: Keith Wiley  . Number of children: 1  . Years of education: Not on file  . Highest education level: Not on file  Occupational History  . 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 Financial resource strain: Not on file  . Food insecurity:    Worry: Not on file    Inability: Not on file  . Transportation needs:    Medical: Not on file    Non-medical: Not on file  Tobacco Use  . Smoking status: Former Smoker    Packs/day: 1.00    Years: 20.00    Pack years: 20.00    Types: Cigarettes    Last attempt to quit: 04/01/1977     Years since quitting: 41.2  . Smokeless tobacco: Never Used  Substance and Sexual Activity  . Alcohol use: No  . Drug use: No  . Sexual activity: Not on file  Lifestyle  . Physical activity:    Days per week: Not on file    Minutes per session: Not on file  . Stress: Not on file  Relationships  . Social connections:    Talks on phone: Not on file    Gets together: Not on file    Attends religious service: Not on file    Active member of club or organization: Not on file    Attends meetings of clubs or organizations: Not on file    Relationship status: Not on file  Other Topics Concern  . Not on file  Social History Narrative  . Not on file   Tobacco Counseling Counseling given: Not Answered   Activities of Daily Living In your present state of health, do you have any difficulty performing the following activities: 07/09/2018  Hearing? N  Vision? Y  Comment wears rx glasses when needed  Difficulty concentrating or making decisions? N  Walking or climbing stairs? N  Dressing or bathing? N  Doing errands, shopping? N  Preparing Food and eating ? N  Using the Toilet? N  In the past six months, have you accidently leaked urine? N  Do you have problems with loss of bowel control? N  Managing your Medications? N  Managing your Finances? N  Housekeeping or managing your Housekeeping? N  Some recent data might be hidden     Immunizations and Health Maintenance Immunization History  Administered Date(s) Administered  . Influenza, High Dose Seasonal PF 10/10/2016, 09/27/2017  . Influenza,inj,Quad PF,6+ Mos 10/13/2013, 11/04/2014, 11/09/2015  . Pneumococcal Conjugate-13 10/13/2013  . Tdap 08/21/2011  . Zoster 08/21/2010   There are no preventive care reminders to display for this patient.  Patient Care Team: Keith Herb, MD as PCP - General (Family Medicine) Keith Seal, MD as Attending Physician (Urology) Keith Oh, MD as Consulting Physician  (Nephrology) Keith Essex, MD as Consulting Physician (Gastroenterology)  Indicate any recent Medical Services you may have received from other than [facility] providers in the past year (date may be approximate).    Assessment:   This is a routine wellness examination for Keith Wiley.  Hearing/Vision screen No exam data present  Dietary issues and exercise activities discussed: Current Exercise Habits: The patient does not participate in regular exercise at present  Goals    .  Prevent falls     Stay active Try and get in 3 meals each day       Depression Screen PHQ 2/9 Scores 07/09/2018 06/26/2018 02/15/2018 09/27/2017  PHQ - 2 Score 0 0 0 0    Fall Risk Fall Risk  07/09/2018 06/26/2018 02/15/2018 09/27/2017 06/04/2017  Falls in the past year? No No No No No    Is the patient's home free of loose throw rugs in walkways, pet beds, electrical cords, etc?   Fall risks and hazards were discussed today.  Cognitive Function: MMSE - Mini Mental State Exam 07/09/2018  Orientation to time 5  Orientation to Place 5  Registration 3  Attention/ Calculation 5  Recall 2  Language- name 2 objects 2  Language- repeat 1  Language- follow 3 step command 3  Language- read & follow direction 1  Write a sentence 1  Copy design 1  Total score 29        Screening Tests Health Maintenance  Topic Date Due  . PNA vac Low Risk Adult (2 of 2 - PPSV23) 07/27/2018 (Originally 10/13/2014)  . INFLUENZA VACCINE  07/25/2018  . TETANUS/TDAP  07/25/2021    Qualifies for Shingles Vaccine? declined  Cancer Screenings: Lung: Low Dose CT Chest recommended if Age 43-80 years, 30 pack-year currently smoking OR have quit w/in 15years. Patient does qualify. Colorectal: due soon  Additional Screenings: declined Hepatitis C Screening:       Plan:   pt is to keep follow up with Dr Laurance Flatten and other specialist He will try and prevent falls and stay active.  A blank advanced directive form was given today.  I have  personally reviewed and noted the following in the patient's chart:   . Medical and social history . Use of alcohol, tobacco or illicit drugs  . Current medications and supplements . Functional ability and status . Nutritional status . Physical activity . Advanced directives . List of other physicians . Hospitalizations, surgeries, and ER visits in previous 12 months . Vitals . Screenings to include cognitive, depression, and falls . Referrals and appointments  In addition, I have reviewed and discussed with patient certain preventive protocols, quality metrics, and best practice recommendations. A written personalized care plan for preventive services as well as general preventive health recommendations were provided to patient.     [place] Dec, [place]   06/23/1600

## 2018-07-09 NOTE — Patient Instructions (Signed)
  Mr. Keith Wiley , Thank you for taking time to come for your Medicare Wellness Visit. I appreciate your ongoing commitment to your health goals. Please review the following plan we discussed and let me know if I can assist you in the future.   These are the goals we discussed: Goals    . Prevent falls     Stay active Try and get in 3 meals each day        This is a list of the screening recommended for you and due dates:  Health Maintenance  Topic Date Due  . Pneumonia vaccines (2 of 2 - PPSV23) 07/27/2018*  . Flu Shot  07/25/2018  . Tetanus Vaccine  07/25/2021  *Topic was postponed. The date shown is not the original due date.   Keep follow up with Dr Laurance Flatten and other specialist Stay active and do not put yourself at risk for falls If you choose to fill out advanced directives, bring in a copy for our office.

## 2018-08-01 ENCOUNTER — Other Ambulatory Visit: Payer: Self-pay

## 2018-08-01 NOTE — Patient Outreach (Signed)
[facility]) Care Management  08/01/2018  Keith Wiley May 31, 1936 [id number]   Medication Adherence call to Keith Wiley left a message for patient to call back patient is due on Losartan /Hctz 100/25 and Pravastatin 80 mg. Keith Wiley is showing past due under [place].  [facility] Management Direct Dial [phone]  Fax [phone] [name]

## 2018-08-02 ENCOUNTER — Other Ambulatory Visit: Payer: Self-pay | Admitting: Family Medicine

## 2018-08-13 ENCOUNTER — Ambulatory Visit (INDEPENDENT_AMBULATORY_CARE_PROVIDER_SITE_OTHER): Payer: Medicare Other | Admitting: Family Medicine

## 2018-08-13 ENCOUNTER — Encounter: Payer: Self-pay | Admitting: Family Medicine

## 2018-08-13 ENCOUNTER — Other Ambulatory Visit: Payer: Medicare Other

## 2018-08-13 VITALS — BP 131/76 | HR 80 | Temp 97.5°F | Ht 67.0 in | Wt 208.0 lb

## 2018-08-13 DIAGNOSIS — Z1211 Encounter for screening for malignant neoplasm of colon: Secondary | ICD-10-CM

## 2018-08-13 DIAGNOSIS — J0101 Acute recurrent maxillary sinusitis: Secondary | ICD-10-CM

## 2018-08-13 MED ORDER — AZITHROMYCIN 250 MG PO TABS: ORAL_TABLET | ORAL | 0 refills | Status: DC

## 2018-08-13 NOTE — Addendum Note (Signed)
Addended by: Liliane Bade on: 08/13/2018 03:04 PM   Modules accepted: Orders

## 2018-08-13 NOTE — Progress Notes (Signed)
BP 131/76   Pulse 80   Temp (!) 97.5 F (36.4 C) (Oral)   Ht 5\' 7"  (1.702 m)   Wt 208 lb (94.3 kg)   SpO2 97%   BMI 32.58 kg/m    Subjective:    [name], male    DOB: November 15, 1936, 82 y.o.   MRN: [id number]  HPI: Keith Wiley is a 82 y.o. male presenting on 08/13/2018 for Cough (x 1 day- otc robitussin) and Nasal Congestion   HPI Cough and chest congestion Patient is coming in with complaints of cough and congestion and chest congestion that started yesterday but he feels like it is worsening.  He has been using Mucinex at home but usually when he gets like this every couple years he gets bad enough that he ends up with pneumonia and has end up in the hospital before.  He denies any wheezing or shortness of breath yet but he is concerned that it could be heading that direction.  He denies any fevers or chills currently.  Left sinus congestion and pressure and drainage  Relevant past medical, surgical, family and social history reviewed and updated as indicated. Interim medical history since our last visit reviewed. Allergies and medications reviewed and updated.  Review of Systems  Constitutional: Negative for chills and fever.  HENT: Positive for congestion, postnasal drip, sinus pressure and sore throat. Negative for ear discharge, ear pain, rhinorrhea, sneezing and voice change.   Eyes: Negative for pain, discharge, redness and visual disturbance.  Respiratory: Positive for cough. Negative for shortness of breath and wheezing.   Cardiovascular: Negative for chest pain and leg swelling.  Musculoskeletal: Negative for gait problem.  Skin: Negative for rash.  All other systems reviewed and are negative.   Per HPI unless specifically indicated above   Allergies as of 08/13/2018      Reactions   Augmentin [amoxicillin-pot Clavulanate] Diarrhea      Medication List        Accurate as of 08/13/18 11:59 PM. Always use your most recent med list.            allopurinol 100 MG tablet Commonly known as:  ZYLOPRIM TAKE 1 TABLET BY MOUTH  DAILY   amLODipine 5 MG tablet Commonly known as:  NORVASC TAKE 1 AND 1/2 TABLETS BY  MOUTH EVERY DAY   aspirin EC 81 MG tablet Take 81 mg by mouth daily.   azithromycin 250 MG tablet Commonly known as:  ZITHROMAX Take 2 the first day and then one each day after.   cholecalciferol 1000 units tablet Commonly known as:  VITAMIN D Take 1,000 Units by mouth daily.   fenofibrate 160 MG tablet TAKE 1 TABLET BY MOUTH ONCE DAILY   fish oil-omega-3 fatty acids 1000 MG capsule Take 1 g by mouth 2 (two) times daily.   levothyroxine 88 MCG tablet Commonly known as:  SYNTHROID, LEVOTHROID Take 1 tablet (88 mcg total) by mouth daily before breakfast.   losartan-hydrochlorothiazide 100-25 MG tablet Commonly known as:  HYZAAR TAKE 1 TABLET BY MOUTH  DAILY   pravastatin 80 MG tablet Commonly known as:  PRAVACHOL TAKE 1 TABLET BY MOUTH  DAILY   triamcinolone cream 0.1 % Commonly known as:  KENALOG Apply 1 application topically 2 (two) times daily.   vitamin C 500 MG tablet Commonly known as:  ASCORBIC ACID Take 500 mg by mouth daily.          Objective:    BP 131/76  Pulse 80   Temp (!) 97.5 F (36.4 C) (Oral)   Ht 5\' 7"  (1.702 m)   Wt 208 lb (94.3 kg)   SpO2 97%   BMI 32.58 kg/m   Wt Readings from Last 3 Encounters:  08/13/18 208 lb (94.3 kg)  07/09/18 207 lb (93.9 kg)  06/26/18 203 lb (92.1 kg)    Physical Exam  Constitutional: He is oriented to person, place, and time. He appears well-developed and well-nourished. No distress.  HENT:  Right Ear: Tympanic membrane, external ear and ear canal normal.  Left Ear: Tympanic membrane, external ear and ear canal normal.  Nose: Mucosal edema present. No rhinorrhea or sinus tenderness. No epistaxis. Right sinus exhibits no maxillary sinus tenderness and no frontal sinus tenderness. Left sinus exhibits no maxillary sinus tenderness and no  frontal sinus tenderness.  Mouth/Throat: Uvula is midline and mucous membranes are normal. Posterior oropharyngeal edema and posterior oropharyngeal erythema present. No oropharyngeal exudate or tonsillar abscesses.  Eyes: Conjunctivae are normal. No scleral icterus.  Neck: Neck supple. No thyromegaly present.  Cardiovascular: Normal rate, regular rhythm, normal heart sounds and intact distal pulses.  No murmur heard. Pulmonary/Chest: Effort normal and breath sounds normal. No respiratory distress. He has no wheezes. He has no rales.  Musculoskeletal: Normal range of motion. He exhibits no edema.  Lymphadenopathy:    He has no cervical adenopathy.  Neurological: He is alert and oriented to person, place, and time. Coordination normal.  Skin: Skin is warm and dry. No rash noted. He is not diaphoretic.  Psychiatric: He has a normal mood and affect. His behavior is normal.  Nursing note and vitals reviewed.       Assessment & Plan:   Problem List Items Addressed This Visit    None    Visit Diagnoses    Acute recurrent maxillary sinusitis    -  Primary   Relevant Medications   azithromycin (ZITHROMAX) 250 MG tablet       Follow up plan: Return if symptoms worsen or fail to improve.  Counseling provided for all of the vaccine components No orders of the defined types were placed in this encounter.   Caryl Pina, MD [facility] 08/16/2018, 11:10 AM

## 2018-08-16 LAB — FECAL OCCULT BLOOD, IMMUNOCHEMICAL: Fecal Occult Bld: NEGATIVE

## 2018-08-19 ENCOUNTER — Telehealth: Payer: Self-pay | Admitting: Family Medicine

## 2018-08-19 NOTE — Telephone Encounter (Signed)
If the patient has been taking a whole losartan 100/25 daily and we can switch him to Diovan 320/25 1 daily generic.  Make sure that he was on a whole losartan 100/25 daily before switching him to this dose of Diovan.  And obviously should be generic.

## 2018-08-19 NOTE — Telephone Encounter (Signed)
LM 8/26-jhb

## 2018-08-19 NOTE — Telephone Encounter (Signed)
Pt states [facility] doesn't have Losartan-HCTZ anymore and is there an alternative you would like to use. Pt only has 4 more days worth on hand.

## 2018-08-21 MED ORDER — VALSARTAN-HYDROCHLOROTHIAZIDE 320-25 MG PO TABS: 1.0000 | ORAL_TABLET | Freq: Every day | ORAL | 0 refills | Status: DC

## 2018-08-21 MED ORDER — VALSARTAN-HYDROCHLOROTHIAZIDE 320-25 MG PO TABS: 1.0000 | ORAL_TABLET | Freq: Every day | ORAL | 3 refills | Status: DC

## 2018-08-21 NOTE — Telephone Encounter (Signed)
Pt aware.

## 2018-08-21 NOTE — Addendum Note (Signed)
Addended by: Zannie Cove on: 08/21/2018 03:47 PM   Modules accepted: Orders

## 2018-09-20 ENCOUNTER — Other Ambulatory Visit: Payer: Self-pay | Admitting: Family Medicine

## 2018-10-09 DIAGNOSIS — D124 Benign neoplasm of descending colon: Secondary | ICD-10-CM | POA: Diagnosis not present

## 2018-10-09 DIAGNOSIS — Z86010 Personal history of colonic polyps: Secondary | ICD-10-CM | POA: Diagnosis not present

## 2018-10-09 DIAGNOSIS — D122 Benign neoplasm of ascending colon: Secondary | ICD-10-CM | POA: Diagnosis not present

## 2018-10-09 DIAGNOSIS — D125 Benign neoplasm of sigmoid colon: Secondary | ICD-10-CM | POA: Diagnosis not present

## 2018-10-09 DIAGNOSIS — D123 Benign neoplasm of transverse colon: Secondary | ICD-10-CM | POA: Diagnosis not present

## 2018-10-30 ENCOUNTER — Ambulatory Visit (INDEPENDENT_AMBULATORY_CARE_PROVIDER_SITE_OTHER): Payer: Medicare Other | Admitting: Family Medicine

## 2018-10-30 ENCOUNTER — Encounter: Payer: Self-pay | Admitting: Family Medicine

## 2018-10-30 VITALS — BP 138/72 | HR 79 | Temp 97.2°F | Ht 67.0 in | Wt 207.0 lb

## 2018-10-30 DIAGNOSIS — I10 Essential (primary) hypertension: Secondary | ICD-10-CM

## 2018-10-30 DIAGNOSIS — E039 Hypothyroidism, unspecified: Secondary | ICD-10-CM | POA: Diagnosis not present

## 2018-10-30 DIAGNOSIS — M545 Low back pain, unspecified: Secondary | ICD-10-CM

## 2018-10-30 DIAGNOSIS — C61 Malignant neoplasm of prostate: Secondary | ICD-10-CM

## 2018-10-30 DIAGNOSIS — E7800 Pure hypercholesterolemia, unspecified: Secondary | ICD-10-CM | POA: Diagnosis not present

## 2018-10-30 DIAGNOSIS — Z23 Encounter for immunization: Secondary | ICD-10-CM

## 2018-10-30 DIAGNOSIS — N184 Chronic kidney disease, stage 4 (severe): Secondary | ICD-10-CM | POA: Diagnosis not present

## 2018-10-30 DIAGNOSIS — E559 Vitamin D deficiency, unspecified: Secondary | ICD-10-CM

## 2018-10-30 NOTE — Progress Notes (Signed)
Subjective:    [name], male    DOB: 25-May-1936, 82 y.o.   MRN: [id number]  HPI Pt here for follow up and management of chronic medical problems which includes hypertension and hyperlipidemia. He is taking mediation regularly.  The patient has had a recent colonoscopy.  This is because of a history of colon polyps.  The patient is followed regularly by Dr. Jacqualin Combes lab, Dr. Fredrich Birks, and Dr. May God.  His last creatinine was 2.17.  This was on July 3.  He has no specific complaints today.  He will get lab work today to get his flu shot today and will have an EKG done.  According to the patient the colonoscopy was normal.  We do not have a report in the chart yet.  The patient will also receive in addition to the flu shot his Pneumovax.  The patient is on allopurinol antihypertensive medicines fenofibrate omega-3 fatty acids and pravastatin.  He takes Diovan HCT 3 20-25.  The patient today denies any chest pain pressure or tightness.  He denies any shortness of breath.  He recently had a colonoscopy and was told by the doctor that he did not need to have any more colonoscopies.  He sees Dr. Jeffie Pollock once yearly for his prostate.  He also has been followed by the nephrologist, Dr. Edrick Oh but was told by Dr. with he did not need to come back unless there were ongoing problems with kidney function.  We will make sure that he gets a copy of the lab work that gets done today.  The patient brings in blood pressures for review and these will be scanned into the record.  The vast majority of these numbers are less than 140 and in the 01S for the diastolic.  He continues to have ongoing back pain and was scuffling with his grandson recently and fell backward and has had more trouble with his low back.  He is thinking about going back to see the group that used to be [facility] for possible injection in his back.  He was told by the neurosurgeon there that if he had any further injections to come  back and see them versus getting it done by the radiologist.    Patient Active Problem List   Diagnosis Date Noted  . Benign hypertension with chronic kidney disease, stage III ([place]) 03/18/2015  . Allergic rhinitis due to pollen 03/18/2015  . Metabolic syndrome 01/02/3234  . Hypertension 05/28/2013  . Hyperlipidemia 05/28/2013  . Vitamin D deficiency 05/28/2013  . Chronic kidney disease (CKD), stage IV (severe) ([place]) 05/28/2013  . History of prostate cancer 05/28/2013  . Gout 05/28/2013   Outpatient Encounter Medications as of 10/30/2018  Medication Sig  . allopurinol (ZYLOPRIM) 100 MG tablet TAKE 1 TABLET BY MOUTH  DAILY  . amLODipine (NORVASC) 5 MG tablet TAKE 1 AND 1/2 TABLETS BY  MOUTH EVERY DAY  . aspirin EC 81 MG tablet Take 81 mg by mouth daily.  . cholecalciferol (VITAMIN D) 1000 UNITS tablet Take 1,000 Units by mouth daily.  . fenofibrate 160 MG tablet TAKE 1 TABLET BY MOUTH ONCE DAILY  . fish oil-omega-3 fatty acids 1000 MG capsule Take 1 g by mouth 2 (two) times daily.  Marland Kitchen levothyroxine (SYNTHROID, LEVOTHROID) 88 MCG tablet TAKE 1 TABLET BY MOUTH  DAILY BEFORE BREAKFAST  . pravastatin (PRAVACHOL) 80 MG tablet TAKE 1 TABLET BY MOUTH  DAILY  . triamcinolone cream (KENALOG) 0.1 % Apply  1 application topically 2 (two) times daily.  . valsartan-hydrochlorothiazide (DIOVAN HCT) 320-25 MG tablet Take 1 tablet by mouth daily.  . vitamin C (ASCORBIC ACID) 500 MG tablet Take 500 mg by mouth daily.  . [DISCONTINUED] azithromycin (ZITHROMAX) 250 MG tablet Take 2 the first day and then one each day after.   No facility-administered encounter medications on file as of 10/30/2018.      Review of Systems  Constitutional: Negative.   HENT: Negative.   Eyes: Negative.   Respiratory: Negative.   Cardiovascular: Negative.   Gastrointestinal: Negative.   Endocrine: Negative.   Genitourinary: Negative.   Musculoskeletal: Negative.   Skin: Negative.   Allergic/Immunologic: Negative.     Neurological: Negative.   Hematological: Negative.   Psychiatric/Behavioral: Negative.        Objective:   Physical Exam  Constitutional: He is oriented to person, place, and time. He appears well-developed and well-nourished. No distress.  Patient is pleasant and alert and looks much younger than his stated age of 82 years.  HENT:  Head: Normocephalic and atraumatic.  Right Ear: External ear normal.  Left Ear: External ear normal.  Mouth/Throat: Oropharynx is clear and moist. No oropharyngeal exudate.  Nasal turbinate congestion left greater than right  Eyes: Pupils are equal, round, and reactive to light. Conjunctivae and EOM are normal. Right eye exhibits no discharge. Left eye exhibits no discharge. No scleral icterus.  Neck: Normal range of motion. Neck supple. No thyromegaly present.  No bruits thyromegaly or anterior cervical adenopathy  Cardiovascular: Normal rate, regular rhythm, normal heart sounds and intact distal pulses.  No murmur heard. Heart is regular at 72/min with no edema and good pedal pulses  Pulmonary/Chest: Effort normal and breath sounds normal. He has no wheezes. He has no rales. He exhibits no tenderness.  Clear anteriorly and posteriorly and no axillary adenopathy or chest wall masses  Abdominal: Soft. Bowel sounds are normal. He exhibits no mass. There is no tenderness.  No liver or spleen enlargement no epigastric tenderness no bruits no inguinal adenopathy with good inguinal pulses  Genitourinary:  Genitourinary Comments: Followed yearly by Dr. Irine Seal  Musculoskeletal: Normal range of motion. He exhibits no edema or tenderness.  Reflexes were equal bilaterally and patient had good leg raising and hip abduction bilaterally without pain.  No tenderness to palpation.  According to the patient most the pain is in the low back in the LS-spine area.  The patient's back pain was felt with sitting up from a supine position.  Lymphadenopathy:    He has no  cervical adenopathy.  Neurological: He is alert and oriented to person, place, and time. He has normal reflexes. No cranial nerve deficit.  Skin: Skin is warm and dry. No rash noted.  Psychiatric: He has a normal mood and affect. His behavior is normal. Judgment and thought content normal.  The patient's mood affect and behavior were normal.  Nursing note and vitals reviewed.  BP 138/72 (BP Location: Left Arm)   Pulse 79   Temp (!) 97.2 F (36.2 C) (Oral)   Ht _0  (1.702 m)   Wt 207 lb (93.9 kg)   BMI 32.42 kg/m    -EKG within normal limits patient informed of results.    Assessment & Plan:  1. Essential hypertension -The blood pressure is good today and home readings that were brought in for review are also good.  The patient will continue with current treatment and sodium restriction. - BMP8+EGFR - CBC with  Differential/Platelet - Hepatic function panel - EKG 12-Lead  2. Vitamin D deficiency -Continue current treatment pending results of lab work - CBC with Differential/Platelet - VITAMIN D 25 Hydroxy (Vit-D Deficiency, Fractures)  3. Pure hypercholesterolemia -Continue with omega-3 fatty acids pravastatin and fenofibrate pending results of lab work - CBC with Differential/Platelet - Lipid panel - EKG 12-Lead  4. Hypothyroidism, unspecified type -Continue with thyroid replacement pending results of lab work - CBC with Differential/Platelet - Thyroid Panel With TSH  5. Chronic kidney disease (CKD), stage IV (severe) ([facility]) -Patient has been told by Dr. Justin Mend, his nephrologist that he does not have to come back unless there is any worsening of his renal function or he starts having symptoms.  We will continue to monitor this regularly and make sure Dr. Justin Mend gets a copy of all of the lab work associated with his kidneys.  Patient was reminded to keep the blood pressure under good control and avoid all NSAIDs. - CBC with Differential/Platelet  6. Prostate cancer  [facility]) -Follow-up with urology regularly  7. Back pain at L4-L5 level -Patient to schedule visit with neurosurgery and the doctor that took Dr. [facility] place there  Patient Instructions                       Medicare Annual Wellness Visit  [facility] and the medical providers at [facility] strive to bring you the best medical care.  In doing so we not only want to address your current medical conditions and concerns but also to detect new conditions early and prevent illness, disease and health-related problems.    Medicare offers a yearly Wellness Visit which allows our clinical staff to assess your need for preventative services including immunizations, lifestyle education, counseling to decrease risk of preventable diseases and screening for fall risk and other medical concerns.    This visit is provided free of charge (no copay) for all Medicare recipients. The clinical pharmacists at [facility] have begun to conduct these Wellness Visits which will also include a thorough review of all your medications.    As you primary medical provider recommend that you make an appointment for your Annual Wellness Visit if you have not done so already this year.  You may set up this appointment before you leave today or you may call back ([phone]) and schedule an appointment.  Please make sure when you call that you mention that you are scheduling your Annual Wellness Visit with the clinical pharmacist so that the appointment may be made for the proper length of time.     Continue current medications. Continue good therapeutic lifestyle changes which include good diet and exercise. Fall precautions discussed with patient. If an FOBT was given today- please return it to our front desk. If you are over 67 years old - you may need Prevnar 24 or the adult Pneumonia vaccine.  **Flu shots are available--- please call and schedule a FLU-CLINIC  appointment**  After your visit with [place] today you will receive a survey in the mail or online from [employer] regarding your care with [place]. Please take a moment to fill this out. Your feedback is very important to [place] as you can help [place] better understand your patient needs as well as improve your experience and satisfaction. WE CARE ABOUT YOU!!!   Follow-up with neurosurgery as planned for possible injection and low back Follow-up with nephrology as needed Follow-up with Dr. Jeffie Pollock yearly  as planned for prostate cancer  Arrie Senate MD

## 2018-10-30 NOTE — Patient Instructions (Addendum)
Medicare Annual Wellness Visit  [facility] and the medical providers at [facility] strive to bring you the best medical care.  In doing so we not only want to address your current medical conditions and concerns but also to detect new conditions early and prevent illness, disease and health-related problems.    Medicare offers a yearly Wellness Visit which allows our clinical staff to assess your need for preventative services including immunizations, lifestyle education, counseling to decrease risk of preventable diseases and screening for fall risk and other medical concerns.    This visit is provided free of charge (no copay) for all Medicare recipients. The clinical pharmacists at [facility] have begun to conduct these Wellness Visits which will also include a thorough review of all your medications.    As you primary medical provider recommend that you make an appointment for your Annual Wellness Visit if you have not done so already this year.  You may set up this appointment before you leave today or you may call back [phone]) and schedule an appointment.  Please make sure when you call that you mention that you are scheduling your Annual Wellness Visit with the clinical pharmacist so that the appointment may be made for the proper length of time.     Continue current medications. Continue good therapeutic lifestyle changes which include good diet and exercise. Fall precautions discussed with patient. If an FOBT was given today- please return it to our front desk. If you are over 80 years old - you may need Prevnar 41 or the adult Pneumonia vaccine.  **Flu shots are available--- please call and schedule a FLU-CLINIC appointment**  After your visit with [place] today you will receive a survey in the mail or online from [employer] regarding your care with [place]. Please take a moment to fill this out. Your feedback is very  important to [place] as you can help [place] better understand your patient needs as well as improve your experience and satisfaction. WE CARE ABOUT YOU!!!   Follow-up with neurosurgery as planned for possible injection and low back Follow-up with nephrology as needed Follow-up with Dr. Jeffie Pollock yearly as planned for prostate cancer

## 2018-10-31 LAB — BMP8+EGFR
BUN / CREAT RATIO: 20 (ref 10–24)
BUN: 43 mg/dL — ABNORMAL HIGH (ref 8–27)
CHLORIDE: 109 mmol/L — AB (ref 96–106)
CO2: 17 mmol/L — ABNORMAL LOW (ref 20–29)
Calcium: 10.1 mg/dL (ref 8.6–10.2)
Creatinine, Ser: 2.2 mg/dL — ABNORMAL HIGH (ref 0.76–1.27)
GFR, EST AFRICAN AMERICAN: 32 mL/min/{1.73_m2} — AB (ref >59)
GFR, EST NON AFRICAN AMERICAN: 27 mL/min/{1.73_m2} — AB (ref >59)
Glucose: 105 mg/dL — ABNORMAL HIGH (ref 65–99)
Potassium: 5 mmol/L (ref 3.5–5.2)
Sodium: 147 mmol/L — ABNORMAL HIGH (ref 134–144)

## 2018-10-31 LAB — CBC WITH DIFFERENTIAL/PLATELET
BASOS ABS: 0.1 10*3/uL (ref 0.0–0.2)
BASOS: 1 %
EOS (ABSOLUTE): 0.2 10*3/uL (ref 0.0–0.4)
Eos: 2 %
HEMATOCRIT: 43.2 % (ref 37.5–51.0)
HEMOGLOBIN: 13.7 g/dL (ref 13.0–17.7)
IMMATURE GRANS (ABS): 0 10*3/uL (ref 0.0–0.1)
Immature Granulocytes: 0 %
LYMPHS ABS: 2 10*3/uL (ref 0.7–3.1)
Lymphs: 27 %
MCH: 28.4 pg (ref 26.6–33.0)
MCHC: 31.7 g/dL (ref 31.5–35.7)
MCV: 89 fL (ref 79–97)
Monocytes Absolute: 0.8 10*3/uL (ref 0.1–0.9)
Monocytes: 10 %
NEUTROS ABS: 4.5 10*3/uL (ref 1.4–7.0)
Neutrophils: 60 %
Platelets: 332 10*3/uL (ref 150–450)
RBC: 4.83 x10E6/uL (ref 4.14–5.80)
RDW: 12.4 % (ref 12.3–15.4)
WBC: 7.6 10*3/uL (ref 3.4–10.8)

## 2018-10-31 LAB — LIPID PANEL
Chol/HDL Ratio: 2.8 ratio (ref 0.0–5.0)
Cholesterol, Total: 128 mg/dL (ref 100–199)
HDL: 46 mg/dL (ref >39)
LDL CALC: 71 mg/dL (ref 0–99)
Triglycerides: 56 mg/dL (ref 0–149)
VLDL CHOLESTEROL CAL: 11 mg/dL (ref 5–40)

## 2018-10-31 LAB — VITAMIN D 25 HYDROXY (VIT D DEFICIENCY, FRACTURES): VIT D 25 HYDROXY: 35.8 ng/mL (ref 30.0–100.0)

## 2018-10-31 LAB — HEPATIC FUNCTION PANEL
ALT: 21 IU/L (ref 0–44)
AST: 26 IU/L (ref 0–40)
Albumin: 4.7 g/dL (ref 3.5–4.7)
Alkaline Phosphatase: 48 IU/L (ref 39–117)
BILIRUBIN TOTAL: 0.4 mg/dL (ref 0.0–1.2)
BILIRUBIN, DIRECT: 0.16 mg/dL (ref 0.00–0.40)
Total Protein: 7.4 g/dL (ref 6.0–8.5)

## 2018-10-31 LAB — THYROID PANEL WITH TSH
Free Thyroxine Index: 2.1 (ref 1.2–4.9)
T3 Uptake Ratio: 27 % (ref 24–39)
T4, Total: 7.7 ug/dL (ref 4.5–12.0)
TSH: 3.7 u[IU]/mL (ref 0.450–4.500)

## 2018-11-15 ENCOUNTER — Encounter: Payer: Self-pay | Admitting: *Deleted

## 2018-11-25 ENCOUNTER — Telehealth: Payer: Self-pay | Admitting: Family Medicine

## 2018-11-25 MED ORDER — HYDROCHLOROTHIAZIDE 25 MG PO TABS: 25.0000 mg | ORAL_TABLET | Freq: Every day | ORAL | 1 refills | Status: DC

## 2018-11-25 MED ORDER — VALSARTAN 320 MG PO TABS: 320.0000 mg | ORAL_TABLET | Freq: Every day | ORAL | 1 refills | Status: DC

## 2018-11-25 NOTE — Telephone Encounter (Signed)
separted pill into 2 separate pills- valsartan 320 1 daily and hctz 25 1 daily

## 2018-11-25 NOTE — Telephone Encounter (Signed)
Pt states that [facility] said they dont have the      valsartan-hydrochlorothiazide (DIOVAN HCT) 320-25 MG tablet    pt said that we are needing to change his BP medicine to something else, only has 6 pills left.

## 2018-11-25 NOTE — Telephone Encounter (Signed)
Pt aware.

## 2019-01-01 ENCOUNTER — Other Ambulatory Visit: Payer: Self-pay | Admitting: Family Medicine

## 2019-03-12 ENCOUNTER — Other Ambulatory Visit: Payer: Self-pay

## 2019-03-12 ENCOUNTER — Encounter: Payer: Self-pay | Admitting: Family Medicine

## 2019-03-12 ENCOUNTER — Ambulatory Visit (INDEPENDENT_AMBULATORY_CARE_PROVIDER_SITE_OTHER): Payer: Medicare Other

## 2019-03-12 ENCOUNTER — Ambulatory Visit (INDEPENDENT_AMBULATORY_CARE_PROVIDER_SITE_OTHER): Payer: Medicare Other | Admitting: Family Medicine

## 2019-03-12 VITALS — BP 136/73 | HR 82 | Temp 98.2°F | Ht 67.0 in | Wt 207.0 lb

## 2019-03-12 DIAGNOSIS — E7800 Pure hypercholesterolemia, unspecified: Secondary | ICD-10-CM

## 2019-03-12 DIAGNOSIS — I10 Essential (primary) hypertension: Secondary | ICD-10-CM | POA: Diagnosis not present

## 2019-03-12 DIAGNOSIS — R05 Cough: Secondary | ICD-10-CM

## 2019-03-12 DIAGNOSIS — R509 Fever, unspecified: Secondary | ICD-10-CM | POA: Diagnosis not present

## 2019-03-12 DIAGNOSIS — R059 Cough, unspecified: Secondary | ICD-10-CM

## 2019-03-12 DIAGNOSIS — R0989 Other specified symptoms and signs involving the circulatory and respiratory systems: Secondary | ICD-10-CM

## 2019-03-12 DIAGNOSIS — E559 Vitamin D deficiency, unspecified: Secondary | ICD-10-CM | POA: Diagnosis not present

## 2019-03-12 DIAGNOSIS — N184 Chronic kidney disease, stage 4 (severe): Secondary | ICD-10-CM

## 2019-03-12 DIAGNOSIS — E039 Hypothyroidism, unspecified: Secondary | ICD-10-CM | POA: Diagnosis not present

## 2019-03-12 DIAGNOSIS — C61 Malignant neoplasm of prostate: Secondary | ICD-10-CM

## 2019-03-12 MED ORDER — AZITHROMYCIN 250 MG PO TABS: ORAL_TABLET | ORAL | 0 refills | Status: DC

## 2019-03-12 MED ORDER — ALBUTEROL SULFATE HFA 108 (90 BASE) MCG/ACT IN AERS: 2.0000 | INHALATION_SPRAY | Freq: Four times a day (QID) | RESPIRATORY_TRACT | 1 refills | Status: DC | PRN

## 2019-03-12 NOTE — Patient Instructions (Addendum)
Medicare Annual Wellness Visit  [facility] and the medical providers at [facility] strive to bring you the best medical care.  In doing so we not only want to address your current medical conditions and concerns but also to detect new conditions early and prevent illness, disease and health-related problems.    Medicare offers a yearly Wellness Visit which allows our clinical staff to assess your need for preventative services including immunizations, lifestyle education, counseling to decrease risk of preventable diseases and screening for fall risk and other medical concerns.    This visit is provided free of charge (no copay) for all Medicare recipients. The clinical pharmacists at [facility] have begun to conduct these Wellness Visits which will also include a thorough review of all your medications.    As you primary medical provider recommend that you make an appointment for your Annual Wellness Visit if you have not done so already this year.  You may set up this appointment before you leave today or you may call back ([phone]) and schedule an appointment.  Please make sure when you call that you mention that you are scheduling your Annual Wellness Visit with the clinical pharmacist so that the appointment may be made for the proper length of time.     Continue current medications. Continue good therapeutic lifestyle changes which include good diet and exercise. Fall precautions discussed with patient. If an FOBT was given today- please return it to our front desk. If you are over 18 years old - you may need Prevnar 39 or the adult Pneumonia vaccine.  **Flu shots are available--- please call and schedule a FLU-CLINIC appointment**  After your visit with [place] today you will receive a survey in the mail or online from [employer] regarding your care with [place]. Please take a moment to fill this out. Your feedback is very  important to [place] as you can help [place] better understand your patient needs as well as improve your experience and satisfaction. WE CARE ABOUT YOU!!!   Use inhaler, 1 puff every 4-6 hours as needed for wheezing Take Mucinex, maximum strength, 1 twice daily with a large glass of water on a regular basis Take Tylenol for aches pains and fever Take antibiotic as directed If your symptoms get worse or do not improve by the first of the week, get back in touch with [place] or go to the emergency room. Drink plenty of fluids and stay well-hydrated Get rest as much as possible

## 2019-03-12 NOTE — Progress Notes (Signed)
Subjective:    [name], male    DOB: Jul 16, 1936, 83 y.o.   MRN: [id number]  HPI Pt here for follow up and management of chronic medical problems which includes hypertension and hyperlipidemia. He is taking medication regularly.  Patient comes in for his regular visit today but is complaining with a productive cough congestion and fever and runny nose that has been going on for about a week.  His temperature now is 99.  We will make sure we get a chest x-ray and a pulse ox and a CBC as part of the lab work.  This patient has a history of prostate cancer hypertension hyperlipidemia thyroid replacement and vitamin D replacement.  The patient today denies any chest pain.  He has a shortness of breath with the cough and congestion.  He denies any trouble with his stomach including swallowing issues nausea vomiting diarrhea blood in the stool or black tarry bowel movements.  He does complain that his bowels are not moving as frequently but he is not been eating as much and maybe not drinking as much water as he should.  He is passing his water well and because of the history of the prostate cancer in 2009 still sees his urologist, Dr. Irine Seal on a regular basis.  Blood pressure readings that he brings in for review will be scanned into the record and all of these are good.    Patient Active Problem List   Diagnosis Date Noted  . Benign hypertension with chronic kidney disease, stage III ([place]) 03/18/2015  . Allergic rhinitis due to pollen 03/18/2015  . Metabolic syndrome 78/29/5621  . Hypertension 05/28/2013  . Hyperlipidemia 05/28/2013  . Vitamin D deficiency 05/28/2013  . Chronic kidney disease (CKD), stage IV (severe) ([place]) 05/28/2013  . History of prostate cancer 05/28/2013  . Gout 05/28/2013   Outpatient Encounter Medications as of 03/12/2019  Medication Sig  . allopurinol (ZYLOPRIM) 100 MG tablet TAKE 1 TABLET BY MOUTH  DAILY  . amLODipine (NORVASC) 5 MG tablet TAKE 1  AND 1/2 TABLETS BY  MOUTH EVERY DAY  . aspirin EC 81 MG tablet Take 81 mg by mouth daily.  . cholecalciferol (VITAMIN D) 1000 UNITS tablet Take 1,000 Units by mouth daily.  . fenofibrate 160 MG tablet TAKE 1 TABLET BY MOUTH ONCE DAILY  . fish oil-omega-3 fatty acids 1000 MG capsule Take 1 g by mouth 2 (two) times daily.  . hydrochlorothiazide (HYDRODIURIL) 25 MG tablet Take 1 tablet (25 mg total) by mouth daily.  Marland Kitchen levothyroxine (SYNTHROID, LEVOTHROID) 88 MCG tablet TAKE 1 TABLET BY MOUTH  DAILY BEFORE BREAKFAST  . pravastatin (PRAVACHOL) 80 MG tablet TAKE 1 TABLET BY MOUTH  DAILY  . triamcinolone cream (KENALOG) 0.1 % Apply 1 application topically 2 (two) times daily.  . valsartan (DIOVAN) 320 MG tablet Take 1 tablet (320 mg total) by mouth daily.  . vitamin C (ASCORBIC ACID) 500 MG tablet Take 500 mg by mouth daily.  . [DISCONTINUED] valsartan-hydrochlorothiazide (DIOVAN HCT) 320-25 MG tablet Take 1 tablet by mouth daily.   No facility-administered encounter medications on file as of 03/12/2019.      Review of Systems  Constitutional: Positive for fever (low gd x 1 week ).  HENT: Positive for congestion and postnasal drip. Negative for ear pain and sore throat.   Eyes: Negative.   Respiratory: Positive for cough (productive).   Cardiovascular: Negative.   Gastrointestinal: Negative.   Endocrine: Negative.   Genitourinary: Negative.  Musculoskeletal: Negative.   Skin: Negative.   Allergic/Immunologic: Negative.   Neurological: Negative.   Hematological: Negative.   Psychiatric/Behavioral: Negative.        Objective:   Physical Exam Vitals signs and nursing note reviewed.  Constitutional:      General: He is not in acute distress.    Appearance: Normal appearance. He is well-developed. He is obese. He is not ill-appearing or toxic-appearing.     Comments: The patient is pleasant and doing well other than this recent cough and congestion that he has had.  He is not running any  fever today.  He does have some shortness of breath.  HENT:     Head: Normocephalic and atraumatic.     Right Ear: Tympanic membrane, ear canal and external ear normal. There is no impacted cerumen.     Left Ear: Tympanic membrane, ear canal and external ear normal. There is no impacted cerumen.     Nose: Nose normal. No congestion.     Mouth/Throat:     Mouth: Mucous membranes are moist.     Pharynx: Oropharynx is clear. No oropharyngeal exudate or posterior oropharyngeal erythema.  Eyes:     General: No scleral icterus.       Right eye: No discharge.        Left eye: No discharge.     Extraocular Movements: Extraocular movements intact.     Conjunctiva/sclera: Conjunctivae normal.     Pupils: Pupils are equal, round, and reactive to light.  Neck:     Musculoskeletal: Normal range of motion and neck supple.     Thyroid: No thyromegaly.     Vascular: No carotid bruit.     Trachea: No tracheal deviation.     Comments: No anterior cervical adenopathy or thyromegaly. Cardiovascular:     Rate and Rhythm: Normal rate and regular rhythm.     Heart sounds: Normal heart sounds. No murmur.     Comments: The heart is regular at 84/min no edema. Pulmonary:     Effort: Pulmonary effort is normal. No respiratory distress.     Breath sounds: Wheezing and rhonchi present. No rales.     Comments: Some inspiratory wheezes and some rhonchi with coughing.  No rales. Abdominal:     General: Abdomen is flat. Bowel sounds are normal.     Palpations: Abdomen is soft. There is no mass.     Tenderness: There is no abdominal tenderness. There is no guarding.     Comments: No masses tenderness organ enlargement or bruits  Genitourinary:    Rectum: Normal.  Musculoskeletal: Normal range of motion.        General: No tenderness.     Right lower leg: No edema.     Left lower leg: No edema.  Lymphadenopathy:     Cervical: No cervical adenopathy.  Skin:    General: Skin is warm and dry.     Findings: No  rash.  Neurological:     Mental Status: He is alert and oriented to person, place, and time.     Cranial Nerves: No cranial nerve deficit.     Gait: Gait normal.     Deep Tendon Reflexes: Reflexes are normal and symmetric.  Psychiatric:        Mood and Affect: Mood normal.        Behavior: Behavior normal.        Thought Content: Thought content normal.        Judgment: Judgment normal.  Comments: Mood affect and behavior for this patient are normal for him.     BP (!) 147/119 (BP Location: Left Arm)   Pulse 82   Temp 98.2 F (36.8 C) (Oral)   Ht 5' 7"  (1.702 m)   Wt 207 lb (93.9 kg)   BMI 32.42 kg/m        Assessment & Plan:  1. Essential hypertension -Blood pressure is good and stable and the patient will continue with his current treatment regimen - BMP8+EGFR - CBC with Differential/Platelet - Hepatic function panel  2. Pure hypercholesterolemia -Continue with aggressive therapeutic lifestyle changes - CBC with Differential/Platelet - Lipid panel  3. Vitamin D deficiency -Continue with vitamin D replacement pending results of lab work - CBC with Differential/Platelet - VITAMIN D 25 Hydroxy (Vit-D Deficiency, Fractures)  4. Hypothyroidism, unspecified type - CBC with Differential/Platelet  5. Chronic kidney disease (CKD), stage IV (severe) ([facility]) -Watch salt intake avoid NSAIDs and follow-up with nephrologist as planned - BMP8+EGFR - CBC with Differential/Platelet  6. Prostate cancer [facility]) -Follow-up with Dr. Jeffie Pollock, the urologist as planned - CBC with Differential/Platelet  7. Cough -Take Mucinex maximum strength, 1 twice daily for cough and congestion -Use inhaler as needed - DG Chest 2 View; Future  8. Chest congestion -Use inhaler as needed and take Mucinex -Take antibiotic as directed - DG Chest 2 View; Future  9. Fever, unspecified fever cause -Take Tylenol for aches pains and fever - DG Chest 2 View; Future  Meds ordered this encounter   Medications  . albuterol (PROVENTIL HFA;VENTOLIN HFA) 108 (90 Base) MCG/ACT inhaler    Sig: Inhale 2 puffs into the lungs every 6 (six) hours as needed for wheezing or shortness of breath.    Dispense:  1 Inhaler    Refill:  1  . azithromycin (ZITHROMAX) 250 MG tablet    Sig: As directed    Dispense:  6 tablet    Refill:  0   Patient Instructions                       Medicare Annual Wellness Visit  [facility] and the medical providers at [facility] strive to bring you the best medical care.  In doing so we not only want to address your current medical conditions and concerns but also to detect new conditions early and prevent illness, disease and health-related problems.    Medicare offers a yearly Wellness Visit which allows our clinical staff to assess your need for preventative services including immunizations, lifestyle education, counseling to decrease risk of preventable diseases and screening for fall risk and other medical concerns.    This visit is provided free of charge (no copay) for all Medicare recipients. The clinical pharmacists at [facility] have begun to conduct these Wellness Visits which will also include a thorough review of all your medications.    As you primary medical provider recommend that you make an appointment for your Annual Wellness Visit if you have not done so already this year.  You may set up this appointment before you leave today or you may call back ([phone]) and schedule an appointment.  Please make sure when you call that you mention that you are scheduling your Annual Wellness Visit with the clinical pharmacist so that the appointment may be made for the proper length of time.     Continue current medications. Continue good therapeutic lifestyle changes which include good diet and exercise.  Fall precautions discussed with patient. If an FOBT was given today- please return it to our front desk. If  you are over 74 years old - you may need Prevnar 49 or the adult Pneumonia vaccine.  **Flu shots are available--- please call and schedule a FLU-CLINIC appointment**  After your visit with [place] today you will receive a survey in the mail or online from [employer] regarding your care with [place]. Please take a moment to fill this out. Your feedback is very important to [place] as you can help [place] better understand your patient needs as well as improve your experience and satisfaction. WE CARE ABOUT YOU!!!   Use inhaler, 1 puff every 4-6 hours as needed for wheezing Take Mucinex, maximum strength, 1 twice daily with a large glass of water on a regular basis Take Tylenol for aches pains and fever Take antibiotic as directed If your symptoms get worse or do not improve by the first of the week, get back in touch with [place] or go to the emergency room. Drink plenty of fluids and stay well-hydrated Get rest as much as possible  Arrie Senate MD

## 2019-03-13 LAB — CBC WITH DIFFERENTIAL/PLATELET
Basophils Absolute: 0.1 10*3/uL (ref 0.0–0.2)
Basos: 0 %
EOS (ABSOLUTE): 0.2 10*3/uL (ref 0.0–0.4)
Eos: 2 %
Hematocrit: 39.3 % (ref 37.5–51.0)
Hemoglobin: 13.3 g/dL (ref 13.0–17.7)
Immature Grans (Abs): 0 10*3/uL (ref 0.0–0.1)
Immature Granulocytes: 0 %
LYMPHS ABS: 1.9 10*3/uL (ref 0.7–3.1)
Lymphs: 17 %
MCH: 29.2 pg (ref 26.6–33.0)
MCHC: 33.8 g/dL (ref 31.5–35.7)
MCV: 86 fL (ref 79–97)
Monocytes Absolute: 1.1 10*3/uL — ABNORMAL HIGH (ref 0.1–0.9)
Monocytes: 9 %
NEUTROS ABS: 8.1 10*3/uL — AB (ref 1.4–7.0)
Neutrophils: 72 %
Platelets: 357 10*3/uL (ref 150–450)
RBC: 4.55 x10E6/uL (ref 4.14–5.80)
RDW: 12.6 % (ref 11.6–15.4)
WBC: 11.3 10*3/uL — AB (ref 3.4–10.8)

## 2019-03-13 LAB — BMP8+EGFR
BUN/Creatinine Ratio: 16 (ref 10–24)
BUN: 35 mg/dL — AB (ref 8–27)
CO2: 20 mmol/L (ref 20–29)
Calcium: 9.2 mg/dL (ref 8.6–10.2)
Chloride: 101 mmol/L (ref 96–106)
Creatinine, Ser: 2.17 mg/dL — ABNORMAL HIGH (ref 0.76–1.27)
GFR calc Af Amer: 32 mL/min/{1.73_m2} — ABNORMAL LOW (ref >59)
GFR calc non Af Amer: 28 mL/min/{1.73_m2} — ABNORMAL LOW (ref >59)
Glucose: 95 mg/dL (ref 65–99)
Potassium: 4.4 mmol/L (ref 3.5–5.2)
Sodium: 139 mmol/L (ref 134–144)

## 2019-03-13 LAB — HEPATIC FUNCTION PANEL
ALK PHOS: 52 IU/L (ref 39–117)
ALT: 14 IU/L (ref 0–44)
AST: 23 IU/L (ref 0–40)
Albumin: 4.3 g/dL (ref 3.7–4.7)
Bilirubin Total: 0.4 mg/dL (ref 0.0–1.2)
Bilirubin, Direct: 0.2 mg/dL (ref 0.00–0.40)
Total Protein: 6.9 g/dL (ref 6.0–8.5)

## 2019-03-13 LAB — LIPID PANEL
Chol/HDL Ratio: 2.3 ratio (ref 0.0–5.0)
Cholesterol, Total: 103 mg/dL (ref 100–199)
HDL: 44 mg/dL (ref >39)
LDL Calculated: 45 mg/dL (ref 0–99)
Triglycerides: 70 mg/dL (ref 0–149)
VLDL Cholesterol Cal: 14 mg/dL (ref 5–40)

## 2019-03-13 LAB — VITAMIN D 25 HYDROXY (VIT D DEFICIENCY, FRACTURES): Vit D, 25-Hydroxy: 27.1 ng/mL — ABNORMAL LOW (ref 30.0–100.0)

## 2019-03-17 ENCOUNTER — Telehealth: Payer: Self-pay | Admitting: *Deleted

## 2019-03-17 NOTE — Telephone Encounter (Signed)
The patient was given a prescription for a Z-Pak so in effect it is still working for a total of 10 days.  Please reassure him about this.  Please continue to drink plenty of fluids and stay well-hydrated and continue with Mucinex at least for 2 to 3 weeks on a regular basis with a large glass of water twice daily

## 2019-03-17 NOTE — Telephone Encounter (Signed)
I called pt today to check in and see how he is doing.  He states that he does feel some better, temps is now down in the 98 range. He complains most of a DEEP cough. He finished antibiotic yesterday  Still taking mucinex

## 2019-03-17 NOTE — Telephone Encounter (Signed)
Pt aware.

## 2019-04-24 ENCOUNTER — Other Ambulatory Visit: Payer: Self-pay | Admitting: Nurse Practitioner

## 2019-04-24 ENCOUNTER — Other Ambulatory Visit: Payer: Self-pay | Admitting: Family Medicine

## 2019-05-30 ENCOUNTER — Telehealth: Payer: Self-pay | Admitting: Family Medicine

## 2019-05-30 NOTE — Telephone Encounter (Signed)
Aware.  If ankles continue to swell, will need to schedule an appointment to come visit a provider.  Advised to weigh daily and report to provider if up to a three pound weight gain.

## 2019-06-25 ENCOUNTER — Other Ambulatory Visit: Payer: Self-pay | Admitting: Family Medicine

## 2019-06-26 ENCOUNTER — Other Ambulatory Visit: Payer: Self-pay | Admitting: Family Medicine

## 2019-07-10 DIAGNOSIS — Z87442 Personal history of urinary calculi: Secondary | ICD-10-CM | POA: Diagnosis not present

## 2019-07-10 DIAGNOSIS — R3912 Poor urinary stream: Secondary | ICD-10-CM | POA: Diagnosis not present

## 2019-07-23 ENCOUNTER — Ambulatory Visit: Payer: Medicare Other | Admitting: Family Medicine

## 2019-07-24 ENCOUNTER — Encounter: Payer: Self-pay | Admitting: Family Medicine

## 2019-07-24 ENCOUNTER — Ambulatory Visit (INDEPENDENT_AMBULATORY_CARE_PROVIDER_SITE_OTHER): Payer: Medicare Other | Admitting: Family Medicine

## 2019-07-24 ENCOUNTER — Other Ambulatory Visit: Payer: Self-pay

## 2019-07-24 VITALS — BP 129/77 | HR 73 | Temp 98.7°F | Ht 67.0 in | Wt 211.0 lb

## 2019-07-24 DIAGNOSIS — E7800 Pure hypercholesterolemia, unspecified: Secondary | ICD-10-CM

## 2019-07-24 DIAGNOSIS — M1A09X0 Idiopathic chronic gout, multiple sites, without tophus (tophi): Secondary | ICD-10-CM

## 2019-07-24 DIAGNOSIS — E039 Hypothyroidism, unspecified: Secondary | ICD-10-CM

## 2019-07-24 DIAGNOSIS — N184 Chronic kidney disease, stage 4 (severe): Secondary | ICD-10-CM | POA: Diagnosis not present

## 2019-07-24 DIAGNOSIS — I129 Hypertensive chronic kidney disease with stage 1 through stage 4 chronic kidney disease, or unspecified chronic kidney disease: Secondary | ICD-10-CM

## 2019-07-24 DIAGNOSIS — E559 Vitamin D deficiency, unspecified: Secondary | ICD-10-CM

## 2019-07-24 MED ORDER — LEVOTHYROXINE SODIUM 88 MCG PO TABS: ORAL_TABLET | ORAL | 2 refills | Status: DC

## 2019-07-24 MED ORDER — HYDROCHLOROTHIAZIDE 25 MG PO TABS: 25.0000 mg | ORAL_TABLET | Freq: Every day | ORAL | 0 refills | Status: DC

## 2019-07-24 MED ORDER — VALSARTAN 320 MG PO TABS: 320.0000 mg | ORAL_TABLET | Freq: Every day | ORAL | 0 refills | Status: DC

## 2019-07-24 MED ORDER — ALLOPURINOL 100 MG PO TABS: 100.0000 mg | ORAL_TABLET | Freq: Every day | ORAL | 0 refills | Status: DC

## 2019-07-24 MED ORDER — PRAVASTATIN SODIUM 80 MG PO TABS: 80.0000 mg | ORAL_TABLET | Freq: Every day | ORAL | 0 refills | Status: DC

## 2019-07-24 MED ORDER — FENOFIBRATE 160 MG PO TABS: 160.0000 mg | ORAL_TABLET | Freq: Every day | ORAL | 0 refills | Status: DC

## 2019-07-24 MED ORDER — AMLODIPINE BESYLATE 5 MG PO TABS: 7.5000 mg | ORAL_TABLET | Freq: Every day | ORAL | 0 refills | Status: DC

## 2019-07-24 NOTE — Patient Instructions (Signed)
It was a pleasure seeing you today, Keith Wiley.  Information regarding what we discussed is included in this packet.  Please make an appointment to see me in 4 months.   In a few days you may receive a survey in the mail or online from [employer] regarding your visit with [place] today. Please take a moment to fill this out. Your feedback is very important to our office. It can help [place] better understand your needs as well as improve your experience and satisfaction. Thank you for taking your time to complete it. We care about you.  Because of recent events of COVID-19 ("Coronavirus"), please follow CDC recommendations:   1. Wash your hand frequently 2. Avoid touching your face 3. Stay away from people who are sick 4. If you have symptoms such as fever, cough, shortness of breath then call your healthcare provider for further guidance 5. If you are sick, STAY AT HOME, unless otherwise directed by your healthcare provider. 6. Follow directions from state and national officials regarding staying safe    Please feel free to call our office if any questions or concerns arise.  Warm Regards, Monia Pouch, FNP-C [facility] [address] [place],  [address] [phone]

## 2019-07-24 NOTE — Progress Notes (Signed)
Subjective:  [name], male    DOB: [birth year], 83 y.o.   MRN: [id number]  Patient Care Team: Baruch Gouty, FNP as PCP - General (Family Medicine) Irine Seal, MD as Attending Physician (Urology) Edrick Oh, MD as Consulting Physician (Nephrology) Clarene Essex, MD as Consulting Physician (Gastroenterology)   Chief Complaint:  Medical Management of Chronic Issues, Hyperlipidemia, Hypertension, and Hypothyroidism   HPI: Keith Wiley is a 83 y.o. male presenting on 07/24/2019 for Medical Management of Chronic Issues, Hyperlipidemia, Hypertension, and Hypothyroidism   1. Benign hypertension with chronic kidney disease, stage IV (HCC)  Complaint with meds - Yes Checking BP at home ranging 130/70 Exercising Regularly - No Watching Salt intake - Yes Pertinent ROS:  Headache - No Chest pain - No Dyspnea - Yes, chronic, no change Palpitations - No LE edema - No They report good compliance with medications and can restate their regimen by memory. No medication side effects.  BP Readings from Last 3 Encounters:  07/24/19 129/77  03/12/19 136/73  10/30/18 138/72     2. Pure hypercholesterolemia  Compliant with medications without associated side effects. Does not exercise on a regular basis but does try to watch what he eats.    3. Vitamin D deficiency  Daily oral repletion therapy. No muscles weakness, myalgias, arthralgias, or fractures.    4. Acquired hypothyroidism  Compliant with medications. No bowel changes. No nail, hair, or skin changes. Fatigue at times. No mood changes.    5. Chronic kidney disease (CKD), stage IV (severe) ([place])  Did have swelling in lower extremities 3 weeks ago. States that has since resolved. No increased shortness of breath, cough, orthopnea, increased salt intakes, palpitations, dizziness, or syncope.    6. Idiopathic chronic gout of multiple sites without tophus  On preventative therapy. No gout flares in a long time.       Relevant past medical, surgical, family, and social history reviewed and updated as indicated.  Allergies and medications reviewed and updated. Date reviewed: Chart in Epic.   Past Medical History:  Diagnosis Date  . Cancer ([place])    prostrate  . Chronic kidney disease    kidney stones and 1 smaller kidney, history of a kidney blockage  . Gout   . Hyperlipidemia   . Hypertension    dr Elenore Rota moore  pcp  . Hypothyroidism     Past Surgical History:  Procedure Laterality Date  . BACK SURGERY     cervical   1989, 2011  . CARPAL TUNNEL RELEASE    . HERNIA REPAIR    . prostrate     seeds inplant   2009    Social History   Socioeconomic History  . Marital status: Married    Spouse name: Jewel  . Number of children: 1  . Years of education: Not on file  . Highest education level: Not on file  Occupational History  . 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 Financial resource strain: Not on file  . Food insecurity    Worry: Not on file    Inability: Not on file  . Transportation needs    Medical: Not on file    Non-medical: Not on file  Tobacco Use  . Smoking status: Former Smoker    Packs/day: 1.00    Years: 20.00    Pack years: 20.00    Types: Cigarettes  Quit date: 04/01/1977    Years since quitting: 42.3  . Smokeless tobacco: Never Used  Substance and Sexual Activity  . Alcohol use: No  . Drug use: No  . Sexual activity: Not on file  Lifestyle  . Physical activity    Days per week: Not on file    Minutes per session: Not on file  . Stress: Not on file  Relationships  . Social Herbalist on phone: Not on file    Gets together: Not on file    Attends religious service: Not on file    Active member of club or organization: Not on file    Attends meetings of clubs or organizations: Not on file    Relationship status: Not on file  . Intimate partner violence    Fear of current or ex partner: Not  on file    Emotionally abused: Not on file    Physically abused: Not on file    Forced sexual activity: Not on file  Other Topics Concern  . Not on file  Social History Narrative  . Not on file    Outpatient Encounter Medications as of 07/24/2019  Medication Sig  . allopurinol (ZYLOPRIM) 100 MG tablet Take 1 tablet (100 mg total) by mouth daily.  Marland Kitchen amLODipine (NORVASC) 5 MG tablet Take 1.5 tablets (7.5 mg total) by mouth daily.  Marland Kitchen aspirin EC 81 MG tablet Take 81 mg by mouth daily.  . cholecalciferol (VITAMIN D) 1000 UNITS tablet Take 1,000 Units by mouth daily.  . fenofibrate 160 MG tablet Take 1 tablet (160 mg total) by mouth daily.  . fish oil-omega-3 fatty acids 1000 MG capsule Take 1 g by mouth 2 (two) times daily.  . hydrochlorothiazide (HYDRODIURIL) 25 MG tablet Take 1 tablet (25 mg total) by mouth daily.  Marland Kitchen levothyroxine (SYNTHROID) 88 MCG tablet TAKE 1 TABLET BY MOUTH  DAILY BEFORE BREAKFAST  . pravastatin (PRAVACHOL) 80 MG tablet Take 1 tablet (80 mg total) by mouth daily.  . valsartan (DIOVAN) 320 MG tablet Take 1 tablet (320 mg total) by mouth daily.  . vitamin C (ASCORBIC ACID) 500 MG tablet Take 500 mg by mouth daily.  . [DISCONTINUED] allopurinol (ZYLOPRIM) 100 MG tablet TAKE 1 TABLET BY MOUTH  DAILY  . [DISCONTINUED] amLODipine (NORVASC) 5 MG tablet TAKE 1 AND 1/2 TABLETS BY  MOUTH DAILY  . [DISCONTINUED] fenofibrate 160 MG tablet TAKE 1 TABLET BY MOUTH ONCE DAILY  . [DISCONTINUED] hydrochlorothiazide (HYDRODIURIL) 25 MG tablet TAKE 1 TABLET BY MOUTH  DAILY  . [DISCONTINUED] levothyroxine (SYNTHROID) 88 MCG tablet TAKE 1 TABLET BY MOUTH  DAILY BEFORE BREAKFAST  . [DISCONTINUED] pravastatin (PRAVACHOL) 80 MG tablet TAKE 1 TABLET BY MOUTH  DAILY  . [DISCONTINUED] valsartan (DIOVAN) 320 MG tablet TAKE 1 TABLET BY MOUTH  DAILY  . [DISCONTINUED] albuterol (PROVENTIL HFA;VENTOLIN HFA) 108 (90 Base) MCG/ACT inhaler Inhale 2 puffs into the lungs every 6 (six) hours as needed for  wheezing or shortness of breath.  . [DISCONTINUED] azithromycin (ZITHROMAX) 250 MG tablet As directed  . [DISCONTINUED] triamcinolone cream (KENALOG) 0.1 % Apply 1 application topically 2 (two) times daily.   No facility-administered encounter medications on file as of 07/24/2019.     Allergies  Allergen Reactions  . Augmentin [Amoxicillin-Pot Clavulanate] Diarrhea    Review of Systems  Constitutional: Negative for activity change, appetite change, chills, diaphoresis, fatigue, fever and unexpected weight change.  HENT: Negative.   Eyes: Negative.  Negative for photophobia and  visual disturbance.  Respiratory: Negative for cough, chest tightness and shortness of breath (baseline for pt).   Cardiovascular: Positive for leg swelling. Negative for chest pain and palpitations.  Gastrointestinal: Negative for abdominal distention, abdominal pain, anal bleeding, blood in stool, constipation, diarrhea, nausea, rectal pain and vomiting.  Endocrine: Negative.  Negative for cold intolerance, heat intolerance, polydipsia, polyphagia and polyuria.  Genitourinary: Negative for decreased urine volume, difficulty urinating, dysuria, frequency, hematuria and urgency.  Musculoskeletal: Negative for arthralgias, gait problem and myalgias.  Skin: Negative.  Negative for color change and pallor.  Allergic/Immunologic: Negative.   Neurological: Negative for dizziness and headaches.  Hematological: Negative.  Does not bruise/bleed easily.  Psychiatric/Behavioral: Negative for confusion, hallucinations, sleep disturbance and suicidal ideas.  All other systems reviewed and are negative.       Objective:  BP 129/77   Pulse 73   Temp 98.7 F (37.1 C)   Ht 5' 7"  (1.702 m)   Wt 211 lb (95.7 kg)   BMI 33.05 kg/m    Wt Readings from Last 3 Encounters:  07/24/19 211 lb (95.7 kg)  03/12/19 207 lb (93.9 kg)  10/30/18 207 lb (93.9 kg)    Physical Exam Vitals signs and nursing note reviewed.   Constitutional:      General: He is not in acute distress.    Appearance: Normal appearance. He is well-developed and well-groomed. He is obese. He is not ill-appearing, toxic-appearing or diaphoretic.  HENT:     Head: Normocephalic and atraumatic.     Jaw: There is normal jaw occlusion.     Right Ear: Hearing normal.     Left Ear: Hearing normal.     Nose: Nose normal.     Mouth/Throat:     Lips: Pink.     Mouth: Mucous membranes are moist.     Pharynx: Oropharynx is clear. Uvula midline.  Eyes:     General: Lids are normal.     Extraocular Movements: Extraocular movements intact.     Conjunctiva/sclera: Conjunctivae normal.     Pupils: Pupils are equal, round, and reactive to light.  Neck:     Musculoskeletal: Normal range of motion and neck supple.     Thyroid: No thyroid mass, thyromegaly or thyroid tenderness.     Vascular: No carotid bruit or JVD.     Trachea: Trachea and phonation normal.  Cardiovascular:     Rate and Rhythm: Normal rate and regular rhythm.     Chest Wall: PMI is not displaced.     Pulses: Normal pulses.     Heart sounds: Normal heart sounds. No murmur. No friction rub. No gallop.   Pulmonary:     Effort: Pulmonary effort is normal. No respiratory distress.     Breath sounds: Examination of the right-lower field reveals wheezing. Examination of the left-lower field reveals wheezing. Wheezing (minimal, exertional) present.  Abdominal:     General: Abdomen is protuberant. Bowel sounds are normal. There is no distension or abdominal bruit.     Palpations: Abdomen is soft. There is no hepatomegaly or splenomegaly.     Tenderness: There is no abdominal tenderness. There is no right CVA tenderness or left CVA tenderness.     Hernia: No hernia is present.  Musculoskeletal: Normal range of motion.     Right lower leg: No edema.     Left lower leg: No edema.  Lymphadenopathy:     Cervical: No cervical adenopathy.  Skin:    General: Skin is warm and dry.  Capillary Refill: Capillary refill takes less than 2 seconds.     Coloration: Skin is not cyanotic, jaundiced or pale.     Findings: No rash.  Neurological:     General: No focal deficit present.     Mental Status: He is alert and oriented to person, place, and time.     Cranial Nerves: Cranial nerves are intact.     Sensory: Sensation is intact.     Motor: Motor function is intact.     Coordination: Coordination is intact.     Gait: Gait is intact.     Deep Tendon Reflexes: Reflexes are normal and symmetric.  Psychiatric:        Attention and Perception: Attention and perception normal.        Mood and Affect: Mood and affect normal.        Speech: Speech normal.        Behavior: Behavior normal. Behavior is cooperative.        Thought Content: Thought content normal.        Cognition and Memory: Cognition and memory normal.        Judgment: Judgment normal.     Results for orders placed or performed in visit on 03/12/19  [facility]  Result Value Ref Range   Glucose 95 65 - 99 mg/dL   BUN 35 (H) 8 - 27 mg/dL   Creatinine, Ser 2.17 (H) 0.76 - 1.27 mg/dL   GFR calc non Af Amer 28 (L) >59 mL/min/1.73   GFR calc Af Amer 32 (L) >59 mL/min/1.73   BUN/Creatinine Ratio 16 10 - 24   Sodium 139 134 - 144 mmol/L   Potassium 4.4 3.5 - 5.2 mmol/L   Chloride 101 96 - 106 mmol/L   CO2 20 20 - 29 mmol/L   Calcium 9.2 8.6 - 10.2 mg/dL  CBC with Differential/Platelet  Result Value Ref Range   WBC 11.3 (H) 3.4 - 10.8 x10E3/uL   RBC 4.55 4.14 - 5.80 x10E6/uL   Hemoglobin 13.3 13.0 - 17.7 g/dL   Hematocrit 39.3 37.5 - 51.0 %   MCV 86 79 - 97 fL   MCH 29.2 26.6 - 33.0 pg   MCHC 33.8 31.5 - 35.7 g/dL   RDW 12.6 11.6 - 15.4 %   Platelets 357 150 - 450 x10E3/uL   Neutrophils 72 Not Estab. %   Lymphs 17 Not Estab. %   Monocytes 9 Not Estab. %   Eos 2 Not Estab. %   Basos 0 Not Estab. %   Neutrophils Absolute 8.1 (H) 1.4 - 7.0 x10E3/uL   Lymphocytes Absolute 1.9 0.7 - 3.1 x10E3/uL    Monocytes Absolute 1.1 (H) 0.1 - 0.9 x10E3/uL   EOS (ABSOLUTE) 0.2 0.0 - 0.4 x10E3/uL   Basophils Absolute 0.1 0.0 - 0.2 x10E3/uL   Immature Granulocytes 0 Not Estab. %   Immature Grans (Abs) 0.0 0.0 - 0.1 x10E3/uL  Lipid panel  Result Value Ref Range   Cholesterol, Total 103 100 - 199 mg/dL   Triglycerides 70 0 - 149 mg/dL   HDL 44 >39 mg/dL   VLDL Cholesterol Cal 14 5 - 40 mg/dL   LDL Calculated 45 0 - 99 mg/dL   Chol/HDL Ratio 2.3 0.0 - 5.0 ratio  VITAMIN D 25 Hydroxy (Vit-D Deficiency, Fractures)  Result Value Ref Range   Vit D, 25-Hydroxy 27.1 (L) 30.0 - 100.0 ng/mL  Hepatic function panel  Result Value Ref Range   Total Protein 6.9 6.0 - 8.5 g/dL  Albumin 4.3 3.7 - 4.7 g/dL   Bilirubin Total 0.4 0.0 - 1.2 mg/dL   Bilirubin, Direct 0.20 0.00 - 0.40 mg/dL   Alkaline Phosphatase 52 39 - 117 IU/L   AST 23 0 - 40 IU/L   ALT 14 0 - 44 IU/L       Pertinent labs & imaging results that were available during my care of the patient were reviewed by me and considered in my medical decision making.  Assessment & Plan:  Keith Wiley was seen today for medical management of chronic issues, hyperlipidemia, hypertension and hypothyroidism.  Diagnoses and all orders for this visit:  Benign hypertension with chronic kidney disease, stage IV ([place]) Well controlled. DASH diet and exercise. Labs pending. Continue below.  -     CBC with Differential/Platelet -     CMP14+EGFR -     Microalbumin / creatinine urine ratio -     amLODipine (NORVASC) 5 MG tablet; Take 1.5 tablets (7.5 mg total) by mouth daily. -     hydrochlorothiazide (HYDRODIURIL) 25 MG tablet; Take 1 tablet (25 mg total) by mouth daily. -     valsartan (DIOVAN) 320 MG tablet; Take 1 tablet (320 mg total) by mouth daily.  Pure hypercholesterolemia Diet and exercise encouraged. Labs pending. Continue below.  -     Lipid panel -     fenofibrate 160 MG tablet; Take 1 tablet (160 mg total) by mouth daily. -     pravastatin  (PRAVACHOL) 80 MG tablet; Take 1 tablet (80 mg total) by mouth daily.  Vitamin D deficiency On oral repletion therapy and doing well. Labs pending. Will adjust if needed.  -     VITAMIN D 25 Hydroxy (Vit-D Deficiency, Fractures)  Acquired hypothyroidism On repletion therapy. Labs pending. Will adjust therapy if warranted.  -     Thyroid Panel With TSH -     levothyroxine (SYNTHROID) 88 MCG tablet; TAKE 1 TABLET BY MOUTH  DAILY BEFORE BREAKFAST  Chronic kidney disease (CKD), stage IV (severe) (HCC) No decrease in urine output. Labs pending.  -     CBC with Differential/Platelet -     CMP14+EGFR -     Microalbumin / creatinine urine ratio  Idiopathic chronic gout of multiple sites without tophus Doing well on preventative therapy. Continue below.  -     allopurinol (ZYLOPRIM) 100 MG tablet; Take 1 tablet (100 mg total) by mouth daily.     Continue all other maintenance medications.  Follow up plan: Return in about 4 months (around 11/24/2019), or if symptoms worsen or fail to improve.  Educational handout given for survey, COVID-19  The above assessment and management plan was discussed with the patient. The patient verbalized understanding of and has agreed to the management plan. Patient is aware to call the clinic if symptoms persist or worsen. Patient is aware when to return to the clinic for a follow-up visit. Patient educated on when it is appropriate to go to the emergency department.   Monia Pouch, FNP-C [facility] [phone] 07/24/19

## 2019-07-25 LAB — CMP14+EGFR
ALT: 17 IU/L (ref 0–44)
AST: 25 IU/L (ref 0–40)
Albumin/Globulin Ratio: 1.5 (ref 1.2–2.2)
Albumin: 4.4 g/dL (ref 3.6–4.6)
Alkaline Phosphatase: 48 IU/L (ref 39–117)
BUN/Creatinine Ratio: 20 (ref 10–24)
BUN: 41 mg/dL — ABNORMAL HIGH (ref 8–27)
Bilirubin Total: 0.5 mg/dL (ref 0.0–1.2)
CO2: 20 mmol/L (ref 20–29)
Calcium: 10.4 mg/dL — ABNORMAL HIGH (ref 8.6–10.2)
Chloride: 106 mmol/L (ref 96–106)
Creatinine, Ser: 2.08 mg/dL — ABNORMAL HIGH (ref 0.76–1.27)
GFR calc Af Amer: 34 mL/min/{1.73_m2} — ABNORMAL LOW (ref >59)
GFR calc non Af Amer: 29 mL/min/{1.73_m2} — ABNORMAL LOW (ref >59)
Globulin, Total: 3 g/dL (ref 1.5–4.5)
Glucose: 102 mg/dL — ABNORMAL HIGH (ref 65–99)
Potassium: 5.5 mmol/L — ABNORMAL HIGH (ref 3.5–5.2)
Sodium: 143 mmol/L (ref 134–144)
Total Protein: 7.4 g/dL (ref 6.0–8.5)

## 2019-07-25 LAB — THYROID PANEL WITH TSH
Free Thyroxine Index: 2 (ref 1.2–4.9)
T3 Uptake Ratio: 26 % (ref 24–39)
T4, Total: 7.6 ug/dL (ref 4.5–12.0)
TSH: 9.76 u[IU]/mL — ABNORMAL HIGH (ref 0.450–4.500)

## 2019-07-25 LAB — CBC WITH DIFFERENTIAL/PLATELET
Basophils Absolute: 0.1 10*3/uL (ref 0.0–0.2)
Basos: 1 %
EOS (ABSOLUTE): 0.3 10*3/uL (ref 0.0–0.4)
Eos: 3 %
Hematocrit: 43.2 % (ref 37.5–51.0)
Hemoglobin: 14 g/dL (ref 13.0–17.7)
Immature Grans (Abs): 0 10*3/uL (ref 0.0–0.1)
Immature Granulocytes: 0 %
Lymphocytes Absolute: 2.2 10*3/uL (ref 0.7–3.1)
Lymphs: 23 %
MCH: 29.1 pg (ref 26.6–33.0)
MCHC: 32.4 g/dL (ref 31.5–35.7)
MCV: 90 fL (ref 79–97)
Monocytes Absolute: 0.9 10*3/uL (ref 0.1–0.9)
Monocytes: 10 %
Neutrophils Absolute: 5.9 10*3/uL (ref 1.4–7.0)
Neutrophils: 63 %
Platelets: 331 10*3/uL (ref 150–450)
RBC: 4.81 x10E6/uL (ref 4.14–5.80)
RDW: 12 % (ref 11.6–15.4)
WBC: 9.3 10*3/uL (ref 3.4–10.8)

## 2019-07-25 LAB — MICROALBUMIN / CREATININE URINE RATIO
Creatinine, Urine: 112 mg/dL
Microalb/Creat Ratio: 76 mg/g creat — ABNORMAL HIGH (ref 0–29)
Microalbumin, Urine: 84.7 ug/mL

## 2019-07-25 LAB — LIPID PANEL
Chol/HDL Ratio: 2.5 ratio (ref 0.0–5.0)
Cholesterol, Total: 111 mg/dL (ref 100–199)
HDL: 45 mg/dL (ref >39)
LDL Calculated: 55 mg/dL (ref 0–99)
Triglycerides: 55 mg/dL (ref 0–149)
VLDL Cholesterol Cal: 11 mg/dL (ref 5–40)

## 2019-07-25 LAB — VITAMIN D 25 HYDROXY (VIT D DEFICIENCY, FRACTURES): Vit D, 25-Hydroxy: 37.7 ng/mL (ref 30.0–100.0)

## 2019-07-25 MED ORDER — LEVOTHYROXINE SODIUM 100 MCG PO TABS: 100.0000 ug | ORAL_TABLET | Freq: Every day | ORAL | 3 refills | Status: DC

## 2019-07-25 NOTE — Addendum Note (Signed)
Addended by: Baruch Gouty on: 07/25/2019 01:42 PM   Modules accepted: Orders

## 2019-07-28 ENCOUNTER — Other Ambulatory Visit: Payer: Self-pay

## 2019-07-28 ENCOUNTER — Other Ambulatory Visit: Payer: Medicare Other

## 2019-07-28 DIAGNOSIS — N184 Chronic kidney disease, stage 4 (severe): Secondary | ICD-10-CM

## 2019-07-29 LAB — CMP14+EGFR
ALT: 17 IU/L (ref 0–44)
AST: 24 IU/L (ref 0–40)
Albumin/Globulin Ratio: 1.6 (ref 1.2–2.2)
Albumin: 4.4 g/dL (ref 3.6–4.6)
Alkaline Phosphatase: 49 IU/L (ref 39–117)
BUN/Creatinine Ratio: 15 (ref 10–24)
BUN: 35 mg/dL — ABNORMAL HIGH (ref 8–27)
Bilirubin Total: 0.4 mg/dL (ref 0.0–1.2)
CO2: 19 mmol/L — ABNORMAL LOW (ref 20–29)
Calcium: 9.7 mg/dL (ref 8.6–10.2)
Chloride: 105 mmol/L (ref 96–106)
Creatinine, Ser: 2.32 mg/dL — ABNORMAL HIGH (ref 0.76–1.27)
GFR calc Af Amer: 29 mL/min/{1.73_m2} — ABNORMAL LOW (ref >59)
GFR calc non Af Amer: 25 mL/min/{1.73_m2} — ABNORMAL LOW (ref >59)
Globulin, Total: 2.8 g/dL (ref 1.5–4.5)
Glucose: 102 mg/dL — ABNORMAL HIGH (ref 65–99)
Potassium: 4.7 mmol/L (ref 3.5–5.2)
Sodium: 141 mmol/L (ref 134–144)
Total Protein: 7.2 g/dL (ref 6.0–8.5)

## 2019-09-03 DIAGNOSIS — M48062 Spinal stenosis, lumbar region with neurogenic claudication: Secondary | ICD-10-CM | POA: Diagnosis not present

## 2019-09-03 DIAGNOSIS — I10 Essential (primary) hypertension: Secondary | ICD-10-CM | POA: Diagnosis not present

## 2019-09-11 ENCOUNTER — Other Ambulatory Visit: Payer: Self-pay | Admitting: Family Medicine

## 2019-09-11 DIAGNOSIS — M1A09X0 Idiopathic chronic gout, multiple sites, without tophus (tophi): Secondary | ICD-10-CM

## 2019-09-11 DIAGNOSIS — M5116 Intervertebral disc disorders with radiculopathy, lumbar region: Secondary | ICD-10-CM | POA: Diagnosis not present

## 2019-09-11 DIAGNOSIS — E7800 Pure hypercholesterolemia, unspecified: Secondary | ICD-10-CM

## 2019-09-11 DIAGNOSIS — M5416 Radiculopathy, lumbar region: Secondary | ICD-10-CM | POA: Diagnosis not present

## 2019-09-11 DIAGNOSIS — I129 Hypertensive chronic kidney disease with stage 1 through stage 4 chronic kidney disease, or unspecified chronic kidney disease: Secondary | ICD-10-CM

## 2019-11-03 ENCOUNTER — Other Ambulatory Visit: Payer: Self-pay

## 2019-11-03 ENCOUNTER — Other Ambulatory Visit: Payer: Self-pay | Admitting: *Deleted

## 2019-11-03 DIAGNOSIS — N184 Chronic kidney disease, stage 4 (severe): Secondary | ICD-10-CM

## 2019-11-03 DIAGNOSIS — M1A09X0 Idiopathic chronic gout, multiple sites, without tophus (tophi): Secondary | ICD-10-CM

## 2019-11-03 DIAGNOSIS — I129 Hypertensive chronic kidney disease with stage 1 through stage 4 chronic kidney disease, or unspecified chronic kidney disease: Secondary | ICD-10-CM

## 2019-11-03 DIAGNOSIS — E7800 Pure hypercholesterolemia, unspecified: Secondary | ICD-10-CM

## 2019-11-03 MED ORDER — AMLODIPINE BESYLATE 5 MG PO TABS: 7.5000 mg | ORAL_TABLET | Freq: Every day | ORAL | 3 refills | Status: DC

## 2019-11-03 MED ORDER — ALLOPURINOL 100 MG PO TABS: 100.0000 mg | ORAL_TABLET | Freq: Every day | ORAL | 3 refills | Status: DC

## 2019-11-03 MED ORDER — VALSARTAN 320 MG PO TABS: 320.0000 mg | ORAL_TABLET | Freq: Every day | ORAL | 3 refills | Status: DC

## 2019-11-03 MED ORDER — PRAVASTATIN SODIUM 80 MG PO TABS: 80.0000 mg | ORAL_TABLET | Freq: Every day | ORAL | 3 refills | Status: DC

## 2019-11-03 MED ORDER — HYDROCHLOROTHIAZIDE 25 MG PO TABS: 25.0000 mg | ORAL_TABLET | Freq: Every day | ORAL | 3 refills | Status: DC

## 2019-11-04 ENCOUNTER — Ambulatory Visit (INDEPENDENT_AMBULATORY_CARE_PROVIDER_SITE_OTHER): Payer: Medicare Other

## 2019-11-04 DIAGNOSIS — Z23 Encounter for immunization: Secondary | ICD-10-CM

## 2019-11-24 ENCOUNTER — Encounter: Payer: Self-pay | Admitting: Family Medicine

## 2019-11-24 ENCOUNTER — Ambulatory Visit (INDEPENDENT_AMBULATORY_CARE_PROVIDER_SITE_OTHER): Payer: Medicare Other | Admitting: Family Medicine

## 2019-11-24 DIAGNOSIS — R05 Cough: Secondary | ICD-10-CM

## 2019-11-24 DIAGNOSIS — R509 Fever, unspecified: Secondary | ICD-10-CM

## 2019-11-24 DIAGNOSIS — R0602 Shortness of breath: Secondary | ICD-10-CM

## 2019-11-24 DIAGNOSIS — R059 Cough, unspecified: Secondary | ICD-10-CM

## 2019-11-24 MED ORDER — DOXYCYCLINE HYCLATE 100 MG PO TABS: 100.0000 mg | ORAL_TABLET | Freq: Two times a day (BID) | ORAL | 0 refills | Status: AC

## 2019-11-24 MED ORDER — ALBUTEROL SULFATE HFA 108 (90 BASE) MCG/ACT IN AERS: 2.0000 | INHALATION_SPRAY | Freq: Four times a day (QID) | RESPIRATORY_TRACT | 0 refills | Status: DC | PRN

## 2019-11-24 MED ORDER — CEFDINIR 300 MG PO CAPS: 300.0000 mg | ORAL_CAPSULE | Freq: Two times a day (BID) | ORAL | 0 refills | Status: DC

## 2019-11-24 NOTE — Progress Notes (Signed)
Virtual Visit via telephone Note Due to COVID-19 pandemic this visit was conducted virtually. This visit type was conducted due to national recommendations for restrictions regarding the COVID-19 Pandemic (e.g. social distancing, sheltering in place) in an effort to limit this patient's exposure and mitigate transmission in our community. All issues noted in this document were discussed and addressed.  A physical exam was not performed with this format.   I connected with [name] on 11/24/2019 at 0755 by telephone and verified that I am speaking with the correct person using two identifiers. Keith Wiley is currently located at home and family is currently with them during visit. The provider, Monia Pouch, FNP is located in their office at time of visit.  I discussed the limitations, risks, security and privacy concerns of performing an evaluation and management service by telephone and the availability of in person appointments. I also discussed with the patient that there may be a patient responsible charge related to this service. The patient expressed understanding and agreed to proceed.  Subjective:  [name], male    DOB: 03/14/1936, 83 y.o.   MRN: [id number]  Chief Complaint:  Cough, Fatigue, and Shortness of Breath   HPI: TYQUAVIOUS Wiley is a 83 y.o. male presenting on 11/24/2019 for Cough, Fatigue, and Shortness of Breath   Pt reports cough, shortness of breath, fever, generalized weakness, and sputum production. Pt states this started about 1.5 weeks ago and is getting worse. States he has not been out of his home or around anyone sick. States his symptoms have gradually become worse. States he has a fever of 101-102 every night. States tylenol does control this. He has been able to eat and drink, but appetite is decreased. He complains of fatigue and malaise. No confusion or decreased urine output. No chest pain, palpitations, or syncope. He does have  chest congestion and wheezing at times.     Relevant past medical, surgical, family, and social history reviewed and updated as indicated.  Allergies and medications reviewed and updated.   Past Medical History:  Diagnosis Date   Cancer ([name])    prostrate   Chronic kidney disease    kidney stones and 1 smaller kidney, history of a kidney blockage   Gout    Hyperlipidemia    Hypertension    dr Elenore Rota moore  pcp   Hypothyroidism     Past Surgical History:  Procedure Laterality Date   BACK SURGERY     cervical   1989, 2011   CARPAL TUNNEL RELEASE     HERNIA REPAIR     prostrate     seeds inplant   2009    Social History   Socioeconomic History   Marital status: Married    Spouse name: Jewel   Number of children: 1   Years of education: Not on file   Highest education level: Not on file  Occupational History   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   Social Designer, fashion/clothing strain: Not on file   Food insecurity    Worry: Not on file    Inability: Not on Lexicographer needs    Medical: Not on file    Non-medical: Not on file  Tobacco Use   Smoking status: Former Smoker    Packs/day: 1.00    Years: 20.00    Pack years: 20.00    Types: Cigarettes  Quit date: 04/01/1977    Years since quitting: 42.6   Smokeless tobacco: Never Used  Substance and Sexual Activity   Alcohol use: No   Drug use: No   Sexual activity: Not on file  Lifestyle   Physical activity    Days per week: Not on file    Minutes per session: Not on file   Stress: Not on file  Relationships   Social connections    Talks on phone: Not on file    Gets together: Not on file    Attends religious service: Not on file    Active member of club or organization: Not on file    Attends meetings of clubs or organizations: Not on file    Relationship status: Not on file   Intimate partner violence    Fear of current or ex  partner: Not on file    Emotionally abused: Not on file    Physically abused: Not on file    Forced sexual activity: Not on file  Other Topics Concern   Not on file  Social History Narrative   Not on file    Outpatient Encounter Medications as of 11/24/2019  Medication Sig   albuterol (VENTOLIN HFA) 108 (90 Base) MCG/ACT inhaler Inhale 2 puffs into the lungs every 6 (six) hours as needed for up to 14 days for wheezing or shortness of breath.   allopurinol (ZYLOPRIM) 100 MG tablet Take 1 tablet (100 mg total) by mouth daily.   amLODipine (NORVASC) 5 MG tablet Take 1.5 tablets (7.5 mg total) by mouth daily.   aspirin EC 81 MG tablet Take 81 mg by mouth daily.   cefdinir (OMNICEF) 300 MG capsule Take 1 capsule (300 mg total) by mouth 2 (two) times daily. 1 po BID   cholecalciferol (VITAMIN D) 1000 UNITS tablet Take 1,000 Units by mouth daily.   doxycycline (VIBRA-TABS) 100 MG tablet Take 1 tablet (100 mg total) by mouth 2 (two) times daily for 7 days. 1 po bid   fenofibrate 160 MG tablet TAKE 1 TABLET BY MOUTH  DAILY   fish oil-omega-3 fatty acids 1000 MG capsule Take 1 g by mouth 2 (two) times daily.   hydrochlorothiazide (HYDRODIURIL) 25 MG tablet Take 1 tablet (25 mg total) by mouth daily.   levothyroxine (SYNTHROID) 100 MCG tablet Take 1 tablet (100 mcg total) by mouth daily.   pravastatin (PRAVACHOL) 80 MG tablet Take 1 tablet (80 mg total) by mouth daily.   valsartan (DIOVAN) 320 MG tablet Take 1 tablet (320 mg total) by mouth daily.   vitamin C (ASCORBIC ACID) 500 MG tablet Take 500 mg by mouth daily.   No facility-administered encounter medications on file as of 11/24/2019.     Allergies  Allergen Reactions   Augmentin [Amoxicillin-Pot Clavulanate] Diarrhea    Review of Systems  Constitutional: Positive for activity change, appetite change, chills, fatigue and fever. Negative for diaphoresis and unexpected weight change.  HENT: Positive for congestion.     Eyes: Negative.  Negative for photophobia and visual disturbance.  Respiratory: Positive for cough, shortness of breath and wheezing. Negative for chest tightness.   Cardiovascular: Negative for chest pain, palpitations and leg swelling.  Gastrointestinal: Negative for abdominal pain, blood in stool, constipation, diarrhea, nausea and vomiting.  Endocrine: Negative.   Genitourinary: Negative for decreased urine volume, difficulty urinating, dysuria, frequency and urgency.  Musculoskeletal: Negative for arthralgias, back pain, gait problem, joint swelling, myalgias, neck pain and neck stiffness.  Skin: Negative.  Negative  for rash.  Allergic/Immunologic: Negative.   Neurological: Positive for weakness. Negative for dizziness, tremors, seizures, syncope, facial asymmetry, speech difficulty, light-headedness, numbness and headaches.  Hematological: Negative.   Psychiatric/Behavioral: Negative for confusion, hallucinations, sleep disturbance and suicidal ideas.  All other systems reviewed and are negative.        Observations/Objective: No vital signs or physical exam, this was a telephone or virtual health encounter.  Pt alert and oriented, answers all questions appropriately, and able to speak in full sentences.    Assessment and Plan: Diagnoses and all orders for this visit:  Cough Shortness of breath Fever in adult Reported symptoms concerning for and consistent with CAP. Pt has been sheltering in place and has had no known sick contacts. Pt declines ED or UC today. Pt will start below and is willing to be evaluated if symptoms do not improve or worsen. Follow up in 2 weeks for reevaluation. Report any new, worsening, or persistent symptoms.  -     doxycycline (VIBRA-TABS) 100 MG tablet; Take 1 tablet (100 mg total) by mouth 2 (two) times daily for 7 days. 1 po bid -     cefdinir (OMNICEF) 300 MG capsule; Take 1 capsule (300 mg total) by mouth 2 (two) times daily. 1 po BID -      albuterol (VENTOLIN HFA) 108 (90 Base) MCG/ACT inhaler; Inhale 2 puffs into the lungs every 6 (six) hours as needed for up to 14 days for wheezing or shortness of breath.   Follow Up Instructions: Return in about 2 weeks (around 12/08/2019), or if symptoms worsen or fail to improve.    I discussed the assessment and treatment plan with the patient. The patient was provided an opportunity to ask questions and all were answered. The patient agreed with the plan and demonstrated an understanding of the instructions.   The patient was advised to call back or seek an in-person evaluation if the symptoms worsen or if the condition fails to improve as anticipated.  The above assessment and management plan was discussed with the patient. The patient verbalized understanding of and has agreed to the management plan. Patient is aware to call the clinic if they develop any new symptoms or if symptoms persist or worsen. Patient is aware when to return to the clinic for a follow-up visit. Patient educated on when it is appropriate to go to the emergency department.    I provided 25 minutes of non-face-to-face time during this encounter. The call started at 0755. The call ended at [place]. The other time was used for coordination of care.    Monia Pouch, FNP-C [facility] [address] [place], [place] [address] [phone] 11/24/2019

## 2019-11-28 ENCOUNTER — Other Ambulatory Visit: Payer: Self-pay | Admitting: Family Medicine

## 2019-11-28 ENCOUNTER — Telehealth: Payer: Self-pay | Admitting: Family Medicine

## 2019-11-28 DIAGNOSIS — R110 Nausea: Secondary | ICD-10-CM

## 2019-11-28 MED ORDER — ONDANSETRON HCL 4 MG PO TABS: 4.0000 mg | ORAL_TABLET | Freq: Three times a day (TID) | ORAL | 0 refills | Status: DC | PRN

## 2019-11-28 NOTE — Telephone Encounter (Signed)
Patient informed and will follow directions

## 2019-11-28 NOTE — Telephone Encounter (Signed)
Pt states he is feeling a little better but the antibiotic is making him sick on his stomach and he can't eat so he feels weak. He is no longer running a fever but has some congestion and still has some SOB but it has gotten better. He wants a different antibiotic that won't make him sick on his stomach. I advised him that most antibiotics cause GI symptoms. Pt says [facility] gave him one in March that didn't bother him and would like that sent in but he can't remember the name of it. It looks like [facility] gave him azithromycin.

## 2019-11-28 NOTE — Telephone Encounter (Signed)
He needs to medications prescribed. I will send in zofran. Make sure he is eating before taking the medications.

## 2020-03-19 ENCOUNTER — Other Ambulatory Visit: Payer: Self-pay

## 2020-03-19 ENCOUNTER — Encounter: Payer: Self-pay | Admitting: Family Medicine

## 2020-03-19 ENCOUNTER — Ambulatory Visit (INDEPENDENT_AMBULATORY_CARE_PROVIDER_SITE_OTHER): Payer: Medicare Other

## 2020-03-19 ENCOUNTER — Ambulatory Visit (INDEPENDENT_AMBULATORY_CARE_PROVIDER_SITE_OTHER): Payer: Medicare Other | Admitting: Family Medicine

## 2020-03-19 VITALS — BP 137/68 | HR 80 | Temp 99.3°F | Resp 20 | Ht 67.0 in | Wt 210.0 lb

## 2020-03-19 DIAGNOSIS — I129 Hypertensive chronic kidney disease with stage 1 through stage 4 chronic kidney disease, or unspecified chronic kidney disease: Secondary | ICD-10-CM

## 2020-03-19 DIAGNOSIS — Z8701 Personal history of pneumonia (recurrent): Secondary | ICD-10-CM

## 2020-03-19 DIAGNOSIS — E559 Vitamin D deficiency, unspecified: Secondary | ICD-10-CM

## 2020-03-19 DIAGNOSIS — R0609 Other forms of dyspnea: Secondary | ICD-10-CM | POA: Diagnosis not present

## 2020-03-19 DIAGNOSIS — E039 Hypothyroidism, unspecified: Secondary | ICD-10-CM | POA: Diagnosis not present

## 2020-03-19 DIAGNOSIS — E7800 Pure hypercholesterolemia, unspecified: Secondary | ICD-10-CM | POA: Diagnosis not present

## 2020-03-19 DIAGNOSIS — R062 Wheezing: Secondary | ICD-10-CM

## 2020-03-19 DIAGNOSIS — N184 Chronic kidney disease, stage 4 (severe): Secondary | ICD-10-CM

## 2020-03-19 DIAGNOSIS — M1A09X0 Idiopathic chronic gout, multiple sites, without tophus (tophi): Secondary | ICD-10-CM

## 2020-03-19 DIAGNOSIS — Z8546 Personal history of malignant neoplasm of prostate: Secondary | ICD-10-CM

## 2020-03-19 MED ORDER — VALSARTAN 320 MG PO TABS: 320.0000 mg | ORAL_TABLET | Freq: Every day | ORAL | 3 refills | Status: DC

## 2020-03-19 MED ORDER — FENOFIBRATE 160 MG PO TABS: 160.0000 mg | ORAL_TABLET | Freq: Every day | ORAL | 3 refills | Status: DC

## 2020-03-19 MED ORDER — HYDROCHLOROTHIAZIDE 25 MG PO TABS: 25.0000 mg | ORAL_TABLET | Freq: Every day | ORAL | 3 refills | Status: DC

## 2020-03-19 MED ORDER — LEVOTHYROXINE SODIUM 100 MCG PO TABS: 100.0000 ug | ORAL_TABLET | Freq: Every day | ORAL | 3 refills | Status: DC

## 2020-03-19 MED ORDER — PRAVASTATIN SODIUM 80 MG PO TABS: 80.0000 mg | ORAL_TABLET | Freq: Every day | ORAL | 3 refills | Status: DC

## 2020-03-19 MED ORDER — AMLODIPINE BESYLATE 5 MG PO TABS: 7.5000 mg | ORAL_TABLET | Freq: Every day | ORAL | 3 refills | Status: DC

## 2020-03-19 MED ORDER — ALLOPURINOL 100 MG PO TABS: 100.0000 mg | ORAL_TABLET | Freq: Every day | ORAL | 3 refills | Status: DC

## 2020-03-19 MED ORDER — IPRATROPIUM-ALBUTEROL 0.5-2.5 (3) MG/3ML IN SOLN: 3.0000 mL | Freq: Two times a day (BID) | RESPIRATORY_TRACT | 1 refills | Status: DC | PRN

## 2020-03-19 NOTE — Progress Notes (Signed)
Subjective:  [name], male    DOB: 12-01-1936, 84 y.o.   MRN: [id number]  Patient Care Team: Baruch Gouty, FNP as PCP - General (Family Medicine) Irine Seal, MD as Attending Physician (Urology) Edrick Oh, MD as Consulting Physician (Nephrology) Clarene Essex, MD as Consulting Physician (Gastroenterology)   Chief Complaint:  Medical Management of Chronic Issues (4 mo ), Hypertension, Hyperlipidemia, and Hypothyroidism   HPI: Keith Wiley is a 84 y.o. male presenting on 03/19/2020 for Medical Management of Chronic Issues (4 mo ), Hypertension, Hyperlipidemia, and Hypothyroidism   1. Pure hypercholesterolemia Compliant with medications - Yes Current medications - pravastatin, fenofibrate, omega-3 Side effects from medications - No Diet - not as healthy as is should be per pts reprot Exercise - no regular exercise  Lab Results  Component Value Date   CHOL 111 07/24/2019   HDL 45 07/24/2019   LDLCALC 55 07/24/2019   TRIG 55 07/24/2019   CHOLHDL 2.5 07/24/2019     Family and personal medical history reviewed. Smoking and ETOH history reviewed.    2. Benign hypertension with chronic kidney disease, stage IV (HCC) Complaint with meds - Yes Current Medications - Norvasc, HCTZ, valsartan Checking BP at home - No Exercising Regularly - No Watching Salt intake - No Pertinent ROS:  Headache - No Fatigue - No Visual Disturbances - Yes Chest pain - No Dyspnea - Yes Palpitations - No LE edema - No They report good compliance with medications and can restate their regimen by memory. No medication side effects.  Family, social, and smoking history reviewed.   BP Readings from Last 3 Encounters:  03/19/20 137/68  07/24/19 129/77  03/12/19 136/73   CMP Latest Ref Rng & Units 07/28/2019 07/24/2019 03/12/2019  Glucose 65 - 99 mg/dL 102(H) 102(H) 95  BUN 8 - 27 mg/dL 35(H) 41(H) 35(H)  Creatinine 0.76 - 1.27 mg/dL 2.32(H) 2.08(H) 2.17(H)  Sodium 134 -  144 mmol/L 141 143 139  Potassium 3.5 - 5.2 mmol/L 4.7 5.5(H) 4.4  Chloride 96 - 106 mmol/L 105 106 101  CO2 20 - 29 mmol/L 19(L) 20 20  Calcium 8.6 - 10.2 mg/dL 9.7 10.4(H) 9.2  Total Protein 6.0 - 8.5 g/dL 7.2 7.4 6.9  Total Bilirubin 0.0 - 1.2 mg/dL 0.4 0.5 0.4  Alkaline Phos 39 - 117 IU/L 49 48 52  AST 0 - 40 IU/L 24 25 23   ALT 0 - 44 IU/L 17 17 14       3. Acquired hypothyroidism Compliant with medications - Yes Current medications - Synthroid 100 mcg Adverse side effects - No Weight - stable  Bowel habit changes - No Heat or cold intolerance - No Mood changes - No Changes in sleep habits - No Fatigue - No Skin, hair, or nail changes - No Tremor - No Palpitations - No Edema - No Shortness of breath - Yes  Lab Results  Component Value Date   TSH 9.760 (H) 07/24/2019     4. Vitamin D deficiency Pt is taking oral repletion therapy. Denies bone pain and tenderness, muscle weakness, fracture, and difficulty walking. Lab Results  Component Value Date   VD25OH 37.7 07/24/2019   VD25OH 27.1 (L) 03/12/2019   VD25OH 35.8 10/30/2018   Lab Results  Component Value Date   CALCIUM 9.7 07/28/2019     5. Chronic kidney disease (CKD), stage IV (severe) (HCC) No changes in urinary output. No lower extremity edema. No weakness, confusion, or fatigue  6. Idiopathic chronic gout of multiple sites without tophus No recent flares. On allopurinol for preventative therapy and tolerating well.   7. History of recent pneumonia States he still has some residual SHOB and DOE at times. States his albuterol inhaler does not relieve the DOE. States rest does relieve symptoms. No fever, chills, weakness, fatigue, or confusion.    Relevant past medical, surgical, family, and social history reviewed and updated as indicated.  Allergies and medications reviewed and updated. Date reviewed: Chart in Epic.   Past Medical History:  Diagnosis Date  . Cancer ([place])    prostrate  . Chronic  kidney disease    kidney stones and 1 smaller kidney, history of a kidney blockage  . Gout   . Hyperlipidemia   . Hypertension    dr Elenore Rota moore  pcp  . Hypothyroidism     Past Surgical History:  Procedure Laterality Date  . BACK SURGERY     cervical   1989, 2011  . CARPAL TUNNEL RELEASE    . HERNIA REPAIR    . prostrate     seeds inplant   2009    Social History   Socioeconomic History  . Marital status: Married    Spouse name: Jewel  . Number of children: 1  . Years of education: Not on file  . Highest education level: Not on file  Occupational History  . 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 Smoking status: Former Smoker    Packs/day: 1.00    Years: 20.00    Pack years: 20.00    Types: Cigarettes    Quit date: 04/01/1977    Years since quitting: 42.9  . Smokeless tobacco: Never Used  Substance and Sexual Activity  . Alcohol use: No  . Drug use: No  . Sexual activity: Not on file  Other Topics Concern  . Not on file  Social History Narrative  . Not on file   Social Determinants of Health   Financial Resource Strain:   . Difficulty of Paying Living Expenses:   Food Insecurity:   . Worried About Charity fundraiser in the Last Year:   . Arboriculturist in the Last Year:   Transportation Needs:   . Film/video editor (Medical):   Marland Kitchen Lack of Transportation (Non-Medical):   Physical Activity:   . Days of Exercise per Week:   . Minutes of Exercise per Session:   Stress:   . Feeling of Stress :   Social Connections:   . Frequency of Communication with Friends and Family:   . Frequency of Social Gatherings with Friends and Family:   . Attends Religious Services:   . Active Member of Clubs or Organizations:   . Attends Archivist Meetings:   Marland Kitchen Marital Status:   Intimate Partner Violence:   . Fear of Current or Ex-Partner:   . Emotionally Abused:   Marland Kitchen Physically Abused:   . Sexually Abused:      Outpatient Encounter Medications as of 03/19/2020  Medication Sig  . albuterol (VENTOLIN HFA) 108 (90 Base) MCG/ACT inhaler Inhale 2 puffs into the lungs every 6 (six) hours as needed for up to 14 days for wheezing or shortness of breath.  . allopurinol (ZYLOPRIM) 100 MG tablet Take 1 tablet (100 mg total) by mouth daily.  Marland Kitchen amLODipine (NORVASC) 5 MG tablet Take 1.5 tablets (7.5 mg total) by mouth daily.  Marland Kitchen  aspirin EC 81 MG tablet Take 81 mg by mouth daily.  . cholecalciferol (VITAMIN D) 1000 UNITS tablet Take 1,000 Units by mouth daily.  . fenofibrate 160 MG tablet Take 1 tablet (160 mg total) by mouth daily.  . fish oil-omega-3 fatty acids 1000 MG capsule Take 1 g by mouth 2 (two) times daily.  . hydrochlorothiazide (HYDRODIURIL) 25 MG tablet Take 1 tablet (25 mg total) by mouth daily.  Marland Kitchen levothyroxine (SYNTHROID) 100 MCG tablet Take 1 tablet (100 mcg total) by mouth daily.  . pravastatin (PRAVACHOL) 80 MG tablet Take 1 tablet (80 mg total) by mouth daily.  . valsartan (DIOVAN) 320 MG tablet Take 1 tablet (320 mg total) by mouth daily.  . vitamin C (ASCORBIC ACID) 500 MG tablet Take 500 mg by mouth daily.  . [DISCONTINUED] allopurinol (ZYLOPRIM) 100 MG tablet Take 1 tablet (100 mg total) by mouth daily.  . [DISCONTINUED] amLODipine (NORVASC) 5 MG tablet Take 1.5 tablets (7.5 mg total) by mouth daily.  . [DISCONTINUED] fenofibrate 160 MG tablet TAKE 1 TABLET BY MOUTH  DAILY  . [DISCONTINUED] hydrochlorothiazide (HYDRODIURIL) 25 MG tablet Take 1 tablet (25 mg total) by mouth daily.  . [DISCONTINUED] levothyroxine (SYNTHROID) 100 MCG tablet Take 1 tablet (100 mcg total) by mouth daily.  . [DISCONTINUED] pravastatin (PRAVACHOL) 80 MG tablet Take 1 tablet (80 mg total) by mouth daily.  . [DISCONTINUED] valsartan (DIOVAN) 320 MG tablet Take 1 tablet (320 mg total) by mouth daily.  Marland Kitchen ipratropium-albuterol (DUONEB) 0.5-2.5 (3) MG/3ML SOLN Take 3 mLs by nebulization 2 (two) times daily as needed  (wheezing, shortness of breath).  . [DISCONTINUED] cefdinir (OMNICEF) 300 MG capsule Take 1 capsule (300 mg total) by mouth 2 (two) times daily. 1 po BID  . [DISCONTINUED] ondansetron (ZOFRAN) 4 MG tablet Take 1 tablet (4 mg total) by mouth every 8 (eight) hours as needed for nausea or vomiting.   No facility-administered encounter medications on file as of 03/19/2020.    Allergies  Allergen Reactions  . Augmentin [Amoxicillin-Pot Clavulanate] Diarrhea    Review of Systems  Constitutional: Negative for activity change, appetite change, chills, diaphoresis, fatigue, fever and unexpected weight change.  HENT: Negative.   Eyes: Negative.  Negative for photophobia and visual disturbance.  Respiratory: Positive for shortness of breath and wheezing. Negative for cough and chest tightness.   Cardiovascular: Negative for chest pain, palpitations and leg swelling.  Gastrointestinal: Negative for abdominal pain, blood in stool, constipation, diarrhea, nausea and vomiting.  Endocrine: Negative.  Negative for cold intolerance, heat intolerance, polydipsia, polyphagia and polyuria.  Genitourinary: Negative for decreased urine volume, difficulty urinating, dysuria, frequency and urgency.  Musculoskeletal: Positive for arthralgias and back pain. Negative for myalgias.  Skin: Negative.   Allergic/Immunologic: Negative.   Neurological: Negative for dizziness, tremors, seizures, syncope, facial asymmetry, speech difficulty, weakness, light-headedness, numbness and headaches.  Hematological: Negative.   Psychiatric/Behavioral: Negative for confusion, hallucinations, sleep disturbance and suicidal ideas.  All other systems reviewed and are negative.       Objective:  BP 137/68   Pulse 80   Temp 99.3 F (37.4 C)   Resp 20   Ht 5' 7"  (1.702 m)   Wt 210 lb (95.3 kg)   SpO2 97%   BMI 32.89 kg/m    Wt Readings from Last 3 Encounters:  03/19/20 210 lb (95.3 kg)  07/24/19 211 lb (95.7 kg)    03/12/19 207 lb (93.9 kg)    Physical Exam Vitals and nursing note reviewed.  Constitutional:      General: He is not in acute distress.    Appearance: Normal appearance. He is well-developed and well-groomed. He is obese. He is not ill-appearing, toxic-appearing or diaphoretic.  HENT:     Head: Normocephalic and atraumatic.     Jaw: There is normal jaw occlusion.     Right Ear: Hearing normal.     Left Ear: Hearing normal.     Nose: Nose normal.     Mouth/Throat:     Lips: Pink.     Mouth: Mucous membranes are moist.     Pharynx: Oropharynx is clear. Uvula midline.  Eyes:     General: Lids are normal.     Extraocular Movements: Extraocular movements intact.     Conjunctiva/sclera: Conjunctivae normal.     Pupils: Pupils are equal, round, and reactive to light.  Neck:     Thyroid: No thyroid mass, thyromegaly or thyroid tenderness.     Vascular: No carotid bruit or JVD.     Trachea: Trachea and phonation normal.  Cardiovascular:     Rate and Rhythm: Normal rate and regular rhythm.     Chest Wall: PMI is not displaced.     Pulses: Normal pulses.     Heart sounds: Normal heart sounds. No murmur. No friction rub. No gallop.   Pulmonary:     Effort: Pulmonary effort is normal. No respiratory distress.     Breath sounds: No stridor. Examination of the right-lower field reveals wheezing. Wheezing present. No rhonchi or rales.  Chest:     Chest wall: No tenderness.  Abdominal:     General: Bowel sounds are normal. There is no distension or abdominal bruit.     Palpations: Abdomen is soft. There is no hepatomegaly or splenomegaly.     Tenderness: There is no abdominal tenderness. There is no right CVA tenderness or left CVA tenderness.     Hernia: No hernia is present.  Musculoskeletal:     Cervical back: Normal range of motion and neck supple.     Thoracic back: Normal.     Lumbar back: Tenderness present. No swelling, edema, deformity, lacerations, spasms or bony tenderness.  Decreased range of motion. Negative right straight leg raise test and negative left straight leg raise test. No scoliosis.     Right hip: Normal.     Left hip: Normal.     Right lower leg: No edema.     Left lower leg: No edema.  Lymphadenopathy:     Cervical: No cervical adenopathy.  Skin:    General: Skin is warm and dry.     Capillary Refill: Capillary refill takes less than 2 seconds.     Coloration: Skin is not cyanotic, jaundiced or pale.     Findings: No rash.  Neurological:     General: No focal deficit present.     Mental Status: He is alert and oriented to person, place, and time.     Cranial Nerves: Cranial nerves are intact. No cranial nerve deficit.     Sensory: Sensation is intact. No sensory deficit.     Motor: Motor function is intact. No weakness.     Coordination: Coordination is intact. Coordination normal.     Gait: Gait abnormal (antalgic).     Deep Tendon Reflexes: Reflexes are normal and symmetric. Reflexes normal.  Psychiatric:        Attention and Perception: Attention and perception normal.        Mood and Affect: Mood and  affect normal.        Speech: Speech normal.        Behavior: Behavior normal. Behavior is cooperative.        Thought Content: Thought content normal.        Cognition and Memory: Cognition and memory normal.        Judgment: Judgment normal.     Results for orders placed or performed in visit on 07/28/19  CMP14+EGFR  Result Value Ref Range   Glucose 102 (H) 65 - 99 mg/dL   BUN 35 (H) 8 - 27 mg/dL   Creatinine, Ser 2.32 (H) 0.76 - 1.27 mg/dL   GFR calc non Af Amer 25 (L) >59 mL/min/1.73   GFR calc Af Amer 29 (L) >59 mL/min/1.73   BUN/Creatinine Ratio 15 10 - 24   Sodium 141 134 - 144 mmol/L   Potassium 4.7 3.5 - 5.2 mmol/L   Chloride 105 96 - 106 mmol/L   CO2 19 (L) 20 - 29 mmol/L   Calcium 9.7 8.6 - 10.2 mg/dL   Total Protein 7.2 6.0 - 8.5 g/dL   Albumin 4.4 3.6 - 4.6 g/dL   Globulin, Total 2.8 1.5 - 4.5 g/dL    Albumin/Globulin Ratio 1.6 1.2 - 2.2   Bilirubin Total 0.4 0.0 - 1.2 mg/dL   Alkaline Phosphatase 49 39 - 117 IU/L   AST 24 0 - 40 IU/L   ALT 17 0 - 44 IU/L     X-Ray: CXR: No acute findings. Preliminary x-ray reading by Monia Pouch, FNP-C, [facility].   Pertinent labs & imaging results that were available during my care of the patient were reviewed by me and considered in my medical decision making.  Assessment & Plan:  Keith Wiley was seen today for medical management of chronic issues, hypertension, hyperlipidemia and hypothyroidism.  Diagnoses and all orders for this visit:  Pure hypercholesterolemia Diet encouraged - increase intake of fresh fruits and vegetables, increase intake of lean proteins. Bake, broil, or grill foods. Avoid fried, greasy, and fatty foods. Avoid fast foods. Increase intake of fiber-rich whole grains. Exercise encouraged - at least 150 minutes per week and advance as tolerated.  Goal BMI < 25. Continue medications as prescribed. Follow up in 3-6 months as discussed.  -     CMP14+EGFR -     Lipid panel -     pravastatin (PRAVACHOL) 80 MG tablet; Take 1 tablet (80 mg total) by mouth daily. -     fenofibrate 160 MG tablet; Take 1 tablet (160 mg total) by mouth daily.  Benign hypertension with chronic kidney disease, stage IV (HCC) BP well controlled. Changes were not made in regimen today. Goal BP is 130/80. Pt aware to report any persistent high or low readings. DASH diet and exercise encouraged. Exercise at least 150 minutes per week and increase as tolerated. Goal BMI > 25. Stress management encouraged. Avoid nicotine and tobacco product use. Avoid excessive alcohol and NSAID's. Avoid more than 2000 mg of sodium daily. Medications as prescribed. Follow up as scheduled.  -     CMP14+EGFR -     CBC with Differential/Platelet -     Lipid panel -     valsartan (DIOVAN) 320 MG tablet; Take 1 tablet (320 mg total) by mouth daily. -     hydrochlorothiazide (HYDRODIURIL) 25  MG tablet; Take 1 tablet (25 mg total) by mouth daily. -     amLODipine (NORVASC) 5 MG tablet; Take 1.5 tablets (7.5 mg total) by mouth  daily.  Acquired hypothyroidism Thyroid disease has been fairly controlled. Labs are pending. Adjustments to regimen will be made if warranted. Make sure to take medications on an empty stomach with a full glass of water. Make sure to avoid vitamins or supplements for at least 4 hours before and 4 hours after taking medications. Repeat labs in 3 months if adjustments are made and in 6 months if stable.   -     levothyroxine (SYNTHROID) 100 MCG tablet; Take 1 tablet (100 mcg total) by mouth daily. -     Thyroid Panel With TSH  Vitamin D deficiency Labs pending. Continue repletion therapy. If indicated, will change repletion dosage. Eat foods rich in Vit D including milk, orange juice, yogurt with vitamin D added, salmon or mackerel, canned tuna fish, cereals with vitamin D added, and cod liver oil. Get out in the sun but make sure to wear at least SPF 30 sunscreen.  -     VITAMIN D 25 Hydroxy (Vit-D Deficiency, Fractures)  Chronic kidney disease (CKD), stage IV (severe) ([place]) Labs pending. Pt aware to avoid NSAIDs -     CMP14+EGFR  Idiopathic chronic gout of multiple sites without tophus Doing well on below, no recent attacks. Will continue.  -     allopurinol (ZYLOPRIM) 100 MG tablet; Take 1 tablet (100 mg total) by mouth daily.  History of recent pneumonia Wheezing Ongoing wheezing and DOE post pneumonia. Will give below for as needed SHOB, wheezing, and DOE. Pt aware to only use as needed for above. Not for regular, everyday use.  -     DG Chest 2 View; Future -     ipratropium-albuterol (DUONEB) 0.5-2.5 (3) MG/3ML SOLN; Take 3 mLs by nebulization 2 (two) times daily as needed (wheezing, shortness of breath). -     For home use only DME Nebulizer machine  History of prostate cancer Follow up with urology scheduled. Asymptomatic.     Continue all  other maintenance medications.  Follow up plan: Return in about 6 months (around 09/19/2020), or if symptoms worsen or fail to improve.   Medical decision-making:  35 minutes spent today reviewing the medical chart, counseling the patient/family, and documenting today's visit.  Continue healthy lifestyle choices, including diet (rich in fruits, vegetables, and lean proteins, and low in salt and simple carbohydrates) and exercise (at least 30 minutes of moderate physical activity daily).  Educational handout given for health maintenance   The above assessment and management plan was discussed with the patient. The patient verbalized understanding of and has agreed to the management plan. Patient is aware to call the clinic if they develop any new symptoms or if symptoms persist or worsen. Patient is aware when to return to the clinic for a follow-up visit. Patient educated on when it is appropriate to go to the emergency department.   Monia Pouch, FNP-C [facility] [phone]

## 2020-03-19 NOTE — Patient Instructions (Signed)
Health Maintenance After Age 82 After age 82, you are at a higher risk for certain long-term diseases and infections as well as injuries from falls. Falls are a major cause of broken bones and head injuries in people who are older than age 82. Getting regular preventive care can help to keep you healthy and well. Preventive care includes getting regular testing and making lifestyle changes as recommended by your health care provider. Talk with your health care provider about:  Which screenings and tests you should have. A screening is a test that checks for a disease when you have no symptoms.  A diet and exercise plan that is right for you. What should I know about screenings and tests to prevent falls? Screening and testing are the best ways to find a health problem early. Early diagnosis and treatment give you the best chance of managing medical conditions that are common after age 82. Certain conditions and lifestyle choices may make you more likely to have a fall. Your health care provider may recommend:  Regular vision checks. Poor vision and conditions such as cataracts can make you more likely to have a fall. If you wear glasses, make sure to get your prescription updated if your vision changes.  Medicine review. Work with your health care provider to regularly review all of the medicines you are taking, including over-the-counter medicines. Ask your health care provider about any side effects that may make you more likely to have a fall. Tell your health care provider if any medicines that you take make you feel dizzy or sleepy.  Osteoporosis screening. Osteoporosis is a condition that causes the bones to get weaker. This can make the bones weak and cause them to break more easily.  Blood pressure screening. Blood pressure changes and medicines to control blood pressure can make you feel dizzy.  Strength and balance checks. Your health care provider may recommend certain tests to check your  strength and balance while standing, walking, or changing positions.  Foot health exam. Foot pain and numbness, as well as not wearing proper footwear, can make you more likely to have a fall.  Depression screening. You may be more likely to have a fall if you have a fear of falling, feel emotionally low, or feel unable to do activities that you used to do.  Alcohol use screening. Using too much alcohol can affect your balance and may make you more likely to have a fall. What actions can I take to lower my risk of falls? General instructions  Talk with your health care provider about your risks for falling. Tell your health care provider if: ? You fall. Be sure to tell your health care provider about all falls, even ones that seem minor. ? You feel dizzy, sleepy, or off-balance.  Take over-the-counter and prescription medicines only as told by your health care provider. These include any supplements.  Eat a healthy diet and maintain a healthy weight. A healthy diet includes low-fat dairy products, low-fat (lean) meats, and fiber from whole grains, beans, and lots of fruits and vegetables. Home safety  Remove any tripping hazards, such as rugs, cords, and clutter.  Install safety equipment such as grab bars in bathrooms and safety rails on stairs.  Keep rooms and walkways well-lit. Activity   Follow a regular exercise program to stay fit. This will help you maintain your balance. Ask your health care provider what types of exercise are appropriate for you.  If you need a cane or   walker, use it as recommended by your health care provider.  Wear supportive shoes that have nonskid soles. Lifestyle  Do not drink alcohol if your health care provider tells you not to drink.  If you drink alcohol, limit how much you have: ? 0-1 drink a day for women. ? 0-2 drinks a day for men.  Be aware of how much alcohol is in your drink. In the [place] one drink equals one typical bottle of beer (12  oz), one-half glass of wine (5 oz), or one shot of hard liquor (1 oz).  Do not use any products that contain nicotine or tobacco, such as cigarettes and e-cigarettes. If you need help quitting, ask your health care provider. Summary  Having a healthy lifestyle and getting preventive care can help to protect your health and wellness after age 82.  Screening and testing are the best way to find a health problem early and help you avoid having a fall. Early diagnosis and treatment give you the best chance for managing medical conditions that are more common for people who are older than age 82.  Falls are a major cause of broken bones and head injuries in people who are older than age 82. Take precautions to prevent a fall at home.  Work with your health care provider to learn what changes you can make to improve your health and wellness and to prevent falls. This information is not intended to replace advice given to you by your health care provider. Make sure you discuss any questions you have with your health care provider. Document Revised: 04/03/2019 Document Reviewed: 10/24/2017 Elsevier Patient Education  2020 Elsevier Inc.  

## 2020-03-20 LAB — CMP14+EGFR
ALT: 22 IU/L (ref 0–44)
AST: 30 IU/L (ref 0–40)
Albumin/Globulin Ratio: 1.8 (ref 1.2–2.2)
Albumin: 4.6 g/dL (ref 3.6–4.6)
Alkaline Phosphatase: 50 IU/L (ref 39–117)
BUN/Creatinine Ratio: 18 (ref 10–24)
BUN: 33 mg/dL — ABNORMAL HIGH (ref 8–27)
Bilirubin Total: 0.4 mg/dL (ref 0.0–1.2)
CO2: 21 mmol/L (ref 20–29)
Calcium: 10.2 mg/dL (ref 8.6–10.2)
Chloride: 104 mmol/L (ref 96–106)
Creatinine, Ser: 1.82 mg/dL — ABNORMAL HIGH (ref 0.76–1.27)
GFR calc Af Amer: 39 mL/min/{1.73_m2} — ABNORMAL LOW (ref >59)
GFR calc non Af Amer: 34 mL/min/{1.73_m2} — ABNORMAL LOW (ref >59)
Globulin, Total: 2.6 g/dL (ref 1.5–4.5)
Glucose: 106 mg/dL — ABNORMAL HIGH (ref 65–99)
Potassium: 4.5 mmol/L (ref 3.5–5.2)
Sodium: 140 mmol/L (ref 134–144)
Total Protein: 7.2 g/dL (ref 6.0–8.5)

## 2020-03-20 LAB — LIPID PANEL
Chol/HDL Ratio: 2.7 ratio (ref 0.0–5.0)
Cholesterol, Total: 125 mg/dL (ref 100–199)
HDL: 47 mg/dL (ref >39)
LDL Chol Calc (NIH): 63 mg/dL (ref 0–99)
Triglycerides: 72 mg/dL (ref 0–149)
VLDL Cholesterol Cal: 15 mg/dL (ref 5–40)

## 2020-03-20 LAB — THYROID PANEL WITH TSH
Free Thyroxine Index: 2.2 (ref 1.2–4.9)
T3 Uptake Ratio: 28 % (ref 24–39)
T4, Total: 8 ug/dL (ref 4.5–12.0)
TSH: 1.9 u[IU]/mL (ref 0.450–4.500)

## 2020-03-20 LAB — CBC WITH DIFFERENTIAL/PLATELET
Basophils Absolute: 0 10*3/uL (ref 0.0–0.2)
Basos: 1 %
EOS (ABSOLUTE): 0.2 10*3/uL (ref 0.0–0.4)
Eos: 3 %
Hematocrit: 43.3 % (ref 37.5–51.0)
Hemoglobin: 14.3 g/dL (ref 13.0–17.7)
Immature Grans (Abs): 0 10*3/uL (ref 0.0–0.1)
Immature Granulocytes: 0 %
Lymphocytes Absolute: 2.4 10*3/uL (ref 0.7–3.1)
Lymphs: 35 %
MCH: 29.4 pg (ref 26.6–33.0)
MCHC: 33 g/dL (ref 31.5–35.7)
MCV: 89 fL (ref 79–97)
Monocytes Absolute: 0.8 10*3/uL (ref 0.1–0.9)
Monocytes: 11 %
Neutrophils Absolute: 3.5 10*3/uL (ref 1.4–7.0)
Neutrophils: 50 %
Platelets: 299 10*3/uL (ref 150–450)
RBC: 4.86 x10E6/uL (ref 4.14–5.80)
RDW: 12.2 % (ref 11.6–15.4)
WBC: 6.9 10*3/uL (ref 3.4–10.8)

## 2020-03-20 LAB — VITAMIN D 25 HYDROXY (VIT D DEFICIENCY, FRACTURES): Vit D, 25-Hydroxy: 39.4 ng/mL (ref 30.0–100.0)

## 2020-03-23 ENCOUNTER — Telehealth: Payer: Self-pay | Admitting: Family Medicine

## 2020-03-23 DIAGNOSIS — R062 Wheezing: Secondary | ICD-10-CM

## 2020-03-23 DIAGNOSIS — Z8701 Personal history of pneumonia (recurrent): Secondary | ICD-10-CM

## 2020-03-23 MED ORDER — IPRATROPIUM-ALBUTEROL 0.5-2.5 (3) MG/3ML IN SOLN: 3.0000 mL | Freq: Two times a day (BID) | RESPIRATORY_TRACT | 1 refills | Status: DC | PRN

## 2020-03-23 NOTE — Telephone Encounter (Signed)
Patient aware [place] resent to [place]

## 2020-03-24 DIAGNOSIS — M48062 Spinal stenosis, lumbar region with neurogenic claudication: Secondary | ICD-10-CM | POA: Diagnosis not present

## 2020-03-24 DIAGNOSIS — I10 Essential (primary) hypertension: Secondary | ICD-10-CM | POA: Diagnosis not present

## 2020-03-25 ENCOUNTER — Telehealth: Payer: Self-pay | Admitting: *Deleted

## 2020-03-25 NOTE — Telephone Encounter (Signed)
Reviewed lab results with patient.

## 2020-03-29 DIAGNOSIS — M5116 Intervertebral disc disorders with radiculopathy, lumbar region: Secondary | ICD-10-CM | POA: Diagnosis not present

## 2020-03-29 DIAGNOSIS — M5416 Radiculopathy, lumbar region: Secondary | ICD-10-CM | POA: Diagnosis not present

## 2020-04-07 ENCOUNTER — Telehealth: Payer: Self-pay | Admitting: Family Medicine

## 2020-04-07 NOTE — Chronic Care Management (AMB) (Signed)
  Chronic Care Management   Note  04/07/2020 Name: Keith Wiley MRN: [id number] DOB: 1936-04-10  Keith Wiley is a 84 y.o. year old male who is a primary care patient of Rakes, Connye Burkitt, FNP. I reached out to [name] by phone today in response to a referral sent by [name]'s health plan.     [name] was given information about Chronic Care Management services today including:  1. CCM service includes personalized support from designated clinical staff supervised by his physician, including individualized plan of care and coordination with other care providers 2. 24/7 contact phone numbers for assistance for urgent and routine care needs. 3. Service will only be billed when office clinical staff spend 20 minutes or more in a month to coordinate care. 4. Only one practitioner may furnish and bill the service in a calendar month. 5. The patient may stop CCM services at any time (effective at the end of the month) by phone call to the office staff. 6. The patient will be responsible for cost sharing (co-pay) of up to 20% of the service fee (after annual deductible is met).  Patient did not agree to enrollment in care management services and does not wish to consider at this time.  Follow up plan: The patient has been provided with contact information for the care management team and has been advised to call with any health related questions or concerns.   [place], [place] [address] Direct Dial: [phone] [email or web address]: [email or web address]

## 2020-07-19 ENCOUNTER — Encounter: Payer: Self-pay | Admitting: Family Medicine

## 2020-07-19 ENCOUNTER — Other Ambulatory Visit: Payer: Self-pay

## 2020-07-19 ENCOUNTER — Ambulatory Visit (INDEPENDENT_AMBULATORY_CARE_PROVIDER_SITE_OTHER): Payer: Medicare Other | Admitting: Family Medicine

## 2020-07-19 VITALS — BP 151/77 | HR 79 | Temp 98.1°F | Ht 67.0 in | Wt 214.0 lb

## 2020-07-19 DIAGNOSIS — E039 Hypothyroidism, unspecified: Secondary | ICD-10-CM

## 2020-07-19 DIAGNOSIS — Z7689 Persons encountering health services in other specified circumstances: Secondary | ICD-10-CM

## 2020-07-19 DIAGNOSIS — Z8546 Personal history of malignant neoplasm of prostate: Secondary | ICD-10-CM

## 2020-07-19 DIAGNOSIS — N184 Chronic kidney disease, stage 4 (severe): Secondary | ICD-10-CM

## 2020-07-19 DIAGNOSIS — I129 Hypertensive chronic kidney disease with stage 1 through stage 4 chronic kidney disease, or unspecified chronic kidney disease: Secondary | ICD-10-CM

## 2020-07-19 DIAGNOSIS — M545 Low back pain, unspecified: Secondary | ICD-10-CM

## 2020-07-19 DIAGNOSIS — R739 Hyperglycemia, unspecified: Secondary | ICD-10-CM

## 2020-07-19 LAB — BAYER DCA HB A1C WAIVED: HB A1C (BAYER DCA - WAIVED): 5.4 % (ref <7.0)

## 2020-07-19 MED ORDER — METHYLPREDNISOLONE ACETATE 40 MG/ML IJ SUSP
40.0000 mg | Freq: Once | INTRAMUSCULAR | Status: AC
  Administered 2020-07-19: 40 mg via INTRAMUSCULAR

## 2020-07-19 NOTE — Progress Notes (Signed)
Subjective: CC: est care, HTN, CKD4, Hypothyroidism PCP: No primary care provider on file. Keith Wiley is a 84 y.o. male presenting to clinic today for:  1. Hypertension w/ CKD4 Patient reports Blood pressure at home: Runs normal.  It is higher when he is in pain; Meds: Compliant with Norvasc 5 mg daily, hydrochlorothiazide 25 mg daily and Diovan 320 mg daily, Side effects: None  ROS: Denies headache, dizziness, visual changes, nausea, vomiting, chest pain, LE swelling, abdominal pain or shortness of breath.  2. Hypothyroidism, acquired History: No history of radiation to the neck.  He has had 2 C-spine surgeries in the past.  No family history of thyroid disease.  Compliant with Synthroid 100 mcg daily.  No heart palpitations, tremor, change in bowel habits or weight.  No change in voice, difficulty swallowing.  3. H/o Prostate cancer Followed by Dr. Jeffie Pollock.  He would like a PSA ordered and sent to his urologist today.  Has office visit with him in a couple of months.  Does not report any hematuria, night sweats.  4.  Chronic low back pain Patient with chronic low back pain.  He is followed by orthopedist and gets a back injection once yearly.  Last injection was in April.  Typically these last about 6 months but shortly after his injection in April she went to bend down to pick something up and caused his back to go into a pain flare.  He is ambulating without difficulty but does note the pain sometimes wakes him up from sleep such that he needs to bring his knees to his chest to relieve the pain.  Uses Tylenol once daily which gives some help with pain.  Unable to take oral NSAIDs secondary to CKD 4.  Does not want to be on any pain medications.  Does not think he is a good candidate for back surgery.  He does report sometimes having right lower extremity weakness but denies any sensory changes or falls.  ROS: Per HPI  Allergies  Allergen Reactions  . Augmentin [Amoxicillin-Pot  Clavulanate] Diarrhea   Past Medical History:  Diagnosis Date  . Cancer ([place])    prostrate  . Chronic kidney disease    kidney stones and 1 smaller kidney, history of a kidney blockage  . Gout   . Hyperlipidemia   . Hypertension    dr Elenore Rota moore  pcp  . Hypothyroidism     Current Outpatient Medications:  .  albuterol (VENTOLIN HFA) 108 (90 Base) MCG/ACT inhaler, Inhale 2 puffs into the lungs every 6 (six) hours as needed for up to 14 days for wheezing or shortness of breath., Disp: 18 g, Rfl: 0 .  allopurinol (ZYLOPRIM) 100 MG tablet, Take 1 tablet (100 mg total) by mouth daily., Disp: 90 tablet, Rfl: 3 .  amLODipine (NORVASC) 5 MG tablet, Take 1.5 tablets (7.5 mg total) by mouth daily., Disp: 135 tablet, Rfl: 3 .  aspirin EC 81 MG tablet, Take 81 mg by mouth daily., Disp: , Rfl:  .  cholecalciferol (VITAMIN D) 1000 UNITS tablet, Take 1,000 Units by mouth daily., Disp: , Rfl:  .  fenofibrate 160 MG tablet, Take 1 tablet (160 mg total) by mouth daily., Disp: 90 tablet, Rfl: 3 .  fish oil-omega-3 fatty acids 1000 MG capsule, Take 1 g by mouth 2 (two) times daily., Disp: , Rfl:  .  hydrochlorothiazide (HYDRODIURIL) 25 MG tablet, Take 1 tablet (25 mg total) by mouth daily., Disp: 90 tablet, Rfl: 3 .  ipratropium-albuterol (DUONEB) 0.5-2.5 (3) MG/3ML SOLN, Take 3 mLs by nebulization 2 (two) times daily as needed (wheezing, shortness of breath)., Disp: 360 mL, Rfl: 1 .  levothyroxine (SYNTHROID) 100 MCG tablet, Take 1 tablet (100 mcg total) by mouth daily., Disp: 90 tablet, Rfl: 3 .  pravastatin (PRAVACHOL) 80 MG tablet, Take 1 tablet (80 mg total) by mouth daily., Disp: 90 tablet, Rfl: 3 .  valsartan (DIOVAN) 320 MG tablet, Take 1 tablet (320 mg total) by mouth daily., Disp: 90 tablet, Rfl: 3 .  vitamin C (ASCORBIC ACID) 500 MG tablet, Take 500 mg by mouth daily., Disp: , Rfl:  Social History   Socioeconomic History  . Marital status: Married    Spouse name: Jewel  . Number of  children: 1  . Years of education: Not on file  . Highest education level: Not on file  Occupational History  . 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 Smoking status: Former Smoker    Packs/day: 1.00    Years: 20.00    Pack years: 20.00    Types: Cigarettes    Quit date: 04/01/1977    Years since quitting: 43.3  . Smokeless tobacco: Never Used  Vaping Use  . Vaping Use: Never used  Substance and Sexual Activity  . Alcohol use: No  . Drug use: No  . Sexual activity: Not on file  Other Topics Concern  . Not on file  Social History Narrative  . Not on file   Social Determinants of Health   Financial Resource Strain:   . Difficulty of Paying Living Expenses:   Food Insecurity:   . Worried About Charity fundraiser in the Last Year:   . Arboriculturist in the Last Year:   Transportation Needs:   . Film/video editor (Medical):   Marland Kitchen Lack of Transportation (Non-Medical):   Physical Activity:   . Days of Exercise per Week:   . Minutes of Exercise per Session:   Stress:   . Feeling of Stress :   Social Connections:   . Frequency of Communication with Friends and Family:   . Frequency of Social Gatherings with Friends and Family:   . Attends Religious Services:   . Active Member of Clubs or Organizations:   . Attends Archivist Meetings:   Marland Kitchen Marital Status:   Intimate Partner Violence:   . Fear of Current or Ex-Partner:   . Emotionally Abused:   Marland Kitchen Physically Abused:   . Sexually Abused:    Family History  Problem Relation Age of Onset  . Heart attack Father   . Cancer Brother        unknown origin  . Obesity Maternal Grandmother   . Cancer Paternal Grandfather        possible -- growth kidney   . Anesthesia problems Neg Hx     Objective: Office vital signs reviewed. BP (!) 151/77   Pulse 79   Temp 98.1 F (36.7 C) (Temporal)   Ht 5\' 7"  (1.702 m)   Wt (!) 214 lb (97.1 kg)   SpO2 97%   BMI 33.52 kg/m    Physical Examination:  General: Awake, alert, well nourished, No acute distress HEENT: Normal, sclera white, MMM; postsurgical scar noted along anterior neck Cardio: regular rate and rhythm, S1S2 heard, no murmurs appreciated Pulm: clear to auscultation bilaterally, no wheezes, rhonchi or rales; normal work of breathing on room  air Extremities: warm, well perfused, No edema, cyanosis or clubbing; +2 pulses bilaterally MSK: stiff gait and station; ambulating independently  Lumbar spine: No midline tenderness to palpation.  No paraspinal muscle tenderness palpation. Skin: dry; intact; no rashes or lesions Neuro: No focal neurologic deficits  Assessment/ Plan: 84 y.o. male   1. Benign hypertension with chronic kidney disease, stage IV (HCC) Blood pressure is on the higher side but he is an active pain.  Check renal function panel.  Will CC to his nephrologist. - Renal Function Panel  2. Elevated serum glucose A1c did not indicate prediabetes or diabetes - Bayer DCA Hb A1c Waived  3. Acquired hypothyroidism Asymptomatic.  Check thyroid panel - Thyroid Panel With TSH  4. Establishing care with new doctor, encounter for  5. History of prostate cancer We will CC PSA result to Dr. Jeffie Pollock - PSA  6. Back pain at L4-L5 level He was given a dose of Depo-Medrol 40 intramuscularly today.  We discussed dosing Tylenol arthritis 3 times daily.  Could consider calling his orthopedist again for repeat back injection if pain is severe.   No orders of the defined types were placed in this encounter.  No orders of the defined types were placed in this encounter.    Janora Norlander, DO [place] [phone]

## 2020-07-19 NOTE — Patient Instructions (Signed)
Tylenol Arthritis every 8 hours ok for back pain Call the orthopedist about another injection You got an intramuscular cortisone shot today for pain.   You had labs performed today.  You will be contacted with the results of the labs once they are available, usually in the next 3 business days for routine lab work.  If you have an active my chart account, they will be released to your MyChart.  If you prefer to have these labs released to you via telephone, please let us know.  If you had a pap smear or biopsy performed, expect to be contacted in about 7-10 days.

## 2020-07-19 NOTE — Addendum Note (Signed)
Addended byCarrolyn Leigh on: 07/19/2020 02:10 PM   Modules accepted: Orders

## 2020-07-20 LAB — RENAL FUNCTION PANEL
Albumin: 4.4 g/dL (ref 3.6–4.6)
Albumin: 4.5 g/dL (ref 3.6–4.6)
BUN/Creatinine Ratio: 16 (ref 10–24)
BUN/Creatinine Ratio: 16 (ref 10–24)
BUN: 25 mg/dL (ref 8–27)
BUN: 25 mg/dL (ref 8–27)
CO2: 20 mmol/L (ref 20–29)
CO2: 23 mmol/L (ref 20–29)
Calcium: 10 mg/dL (ref 8.6–10.2)
Calcium: 10 mg/dL (ref 8.6–10.2)
Chloride: 106 mmol/L (ref 96–106)
Chloride: 106 mmol/L (ref 96–106)
Creatinine, Ser: 1.54 mg/dL — ABNORMAL HIGH (ref 0.76–1.27)
Creatinine, Ser: 1.6 mg/dL — ABNORMAL HIGH (ref 0.76–1.27)
GFR calc Af Amer: 46 mL/min/{1.73_m2} — ABNORMAL LOW (ref >59)
GFR calc Af Amer: 48 mL/min/{1.73_m2} — ABNORMAL LOW (ref >59)
GFR calc non Af Amer: 40 mL/min/{1.73_m2} — ABNORMAL LOW (ref >59)
GFR calc non Af Amer: 41 mL/min/{1.73_m2} — ABNORMAL LOW (ref >59)
Glucose: 104 mg/dL — ABNORMAL HIGH (ref 65–99)
Glucose: 107 mg/dL — ABNORMAL HIGH (ref 65–99)
Phosphorus: 2.2 mg/dL — ABNORMAL LOW (ref 2.8–4.1)
Phosphorus: 2.3 mg/dL — ABNORMAL LOW (ref 2.8–4.1)
Potassium: 4.6 mmol/L (ref 3.5–5.2)
Potassium: 4.8 mmol/L (ref 3.5–5.2)
Sodium: 142 mmol/L (ref 134–144)
Sodium: 143 mmol/L (ref 134–144)

## 2020-07-20 LAB — THYROID PANEL WITH TSH
Free Thyroxine Index: 2 (ref 1.2–4.9)
T3 Uptake Ratio: 26 % (ref 24–39)
T4, Total: 7.6 ug/dL (ref 4.5–12.0)
TSH: 4.13 u[IU]/mL (ref 0.450–4.500)

## 2020-07-20 LAB — PSA: Prostate Specific Ag, Serum: <0.1 ng/mL (ref 0.0–4.0)

## 2020-08-04 DIAGNOSIS — R3912 Poor urinary stream: Secondary | ICD-10-CM | POA: Diagnosis not present

## 2020-08-04 DIAGNOSIS — Z87442 Personal history of urinary calculi: Secondary | ICD-10-CM | POA: Diagnosis not present

## 2020-10-06 DIAGNOSIS — M48062 Spinal stenosis, lumbar region with neurogenic claudication: Secondary | ICD-10-CM | POA: Diagnosis not present

## 2020-10-06 DIAGNOSIS — M25551 Pain in right hip: Secondary | ICD-10-CM | POA: Diagnosis not present

## 2020-10-06 DIAGNOSIS — M25559 Pain in unspecified hip: Secondary | ICD-10-CM | POA: Insufficient documentation

## 2020-10-06 DIAGNOSIS — M4150 Other secondary scoliosis, site unspecified: Secondary | ICD-10-CM | POA: Diagnosis not present

## 2020-10-06 DIAGNOSIS — M419 Scoliosis, unspecified: Secondary | ICD-10-CM | POA: Insufficient documentation

## 2020-10-14 DIAGNOSIS — M5416 Radiculopathy, lumbar region: Secondary | ICD-10-CM | POA: Diagnosis not present

## 2020-10-14 DIAGNOSIS — M5116 Intervertebral disc disorders with radiculopathy, lumbar region: Secondary | ICD-10-CM | POA: Diagnosis not present

## 2020-10-25 ENCOUNTER — Encounter: Payer: Self-pay | Admitting: Family

## 2020-10-25 ENCOUNTER — Ambulatory Visit (INDEPENDENT_AMBULATORY_CARE_PROVIDER_SITE_OTHER): Payer: Medicare Other | Admitting: Family

## 2020-10-25 DIAGNOSIS — B9689 Other specified bacterial agents as the cause of diseases classified elsewhere: Secondary | ICD-10-CM | POA: Diagnosis not present

## 2020-10-25 DIAGNOSIS — J208 Acute bronchitis due to other specified organisms: Secondary | ICD-10-CM

## 2020-10-25 DIAGNOSIS — R059 Cough, unspecified: Secondary | ICD-10-CM | POA: Diagnosis not present

## 2020-10-25 MED ORDER — AZITHROMYCIN 250 MG PO TABS: ORAL_TABLET | ORAL | 0 refills | Status: DC

## 2020-10-25 MED ORDER — PREDNISONE 10 MG (21) PO TBPK: ORAL_TABLET | ORAL | 0 refills | Status: DC

## 2020-10-25 NOTE — Progress Notes (Signed)
Virtual Visit via telephone Note Due to COVID-19 pandemic this visit was conducted virtually. This visit type was conducted due to national recommendations for restrictions regarding the COVID-19 Pandemic (e.g. social distancing, sheltering in place) in an effort to limit this patient's exposure and mitigate transmission in our community. All issues noted in this document were discussed and addressed.  A physical exam was not performed with this format.  I connected with [name] on 10/25/20 at 10:16 AM  by telephone and verified that I am speaking with the correct person using two identifiers. Keith Wiley is currently located at home and no one is currently with him  during visit. The provider, Evelina Dun, FNP is located in their office at time of visit.  I discussed the limitations, risks, security and privacy concerns of performing an evaluation and management service by telephone and the availability of in person appointments. I also discussed with the patient that there may be a patient responsible charge related to this service. The patient expressed understanding and agreed to proceed.   History and Present Illness:  Cough This is a new problem. The current episode started in the past 7 days. The problem has been gradually worsening. The problem occurs every few minutes. The cough is productive of purulent sputum. Associated symptoms include headaches, nasal congestion and postnasal drip. Pertinent negatives include no chills, ear congestion, ear pain, fever, myalgias, rhinorrhea, sore throat, shortness of breath or wheezing. The symptoms are aggravated by lying down. He has tried rest and OTC cough suppressant for the symptoms. The treatment provided mild relief.      Review of Systems  Constitutional: Negative for chills and fever.  HENT: Positive for postnasal drip. Negative for ear pain, rhinorrhea and sore throat.   Respiratory: Positive for cough. Negative for  shortness of breath and wheezing.   Musculoskeletal: Negative for myalgias.  Neurological: Positive for headaches.  All other systems reviewed and are negative.    Observations/Objective: No SOB or distress noted, intermittent coarse cough  Assessment and Plan: Keith Wiley comes in today with chief complaint of No chief complaint on file.   Diagnosis and orders addressed:  1. Cough - azithromycin (ZITHROMAX) 250 MG tablet; Take 500 mg once, then 250 mg for four days  Dispense: 6 tablet; Refill: 0 - predniSONE (STERAPRED UNI-PAK 21 TAB) 10 MG (21) TBPK tablet; Use as directed  Dispense: 21 tablet; Refill: 0 - Novel Coronavirus, NAA ([facility])  2. Acute bacterial bronchitis - Take meds as prescribed - Use a cool mist humidifier  -Use saline nose sprays frequently -Force fluids -For any cough or congestion  Use plain Mucinex- regular strength or max strength is fine -For fever or aces or pains- take tylenol or ibuprofen. -Throat lozenges if help Quarantine until results return  -RTO if symptoms worsen or do not improve  - azithromycin (ZITHROMAX) 250 MG tablet; Take 500 mg once, then 250 mg for four days  Dispense: 6 tablet; Refill: 0 - predniSONE (STERAPRED UNI-PAK 21 TAB) 10 MG (21) TBPK tablet; Use as directed  Dispense: 21 tablet; Refill: 0      I discussed the assessment and treatment plan with the patient. The patient was provided an opportunity to ask questions and all were answered. The patient agreed with the plan and demonstrated an understanding of the instructions.   The patient was advised to call back or seek an in-person evaluation if the symptoms worsen or if the condition fails to improve as  anticipated.  The above assessment and management plan was discussed with the patient. The patient verbalized understanding of and has agreed to the management plan. Patient is aware to call the clinic if symptoms persist or worsen. Patient is aware when to return to the  clinic for a follow-up visit. Patient educated on when it is appropriate to go to the emergency department.   Time call ended:  10:27 AM  I provided 11 minutes of non-face-to-face time during this encounter.    Evelina Dun, FNP

## 2020-10-26 LAB — NOVEL CORONAVIRUS, NAA: SARS-CoV-2, NAA: NOT DETECTED

## 2020-10-26 LAB — SARS-COV-2, NAA 2 DAY TAT

## 2020-11-01 ENCOUNTER — Telehealth: Payer: Self-pay | Admitting: Family

## 2020-11-01 MED ORDER — DOXYCYCLINE HYCLATE 100 MG PO TABS
100.0000 mg | ORAL_TABLET | Freq: Two times a day (BID) | ORAL | 0 refills | Status: DC
Start: 2020-11-01 — End: 2021-01-24

## 2020-11-01 NOTE — Telephone Encounter (Signed)
Patient aware and verbalizes understanding. 

## 2020-11-01 NOTE — Telephone Encounter (Signed)
  Incoming Patient Call  11/01/2020  What symptoms do you have? Patient had televisit 11-1 and he is no better. Cough is deeper and more SOB  How long have you been sick? Almost two weeks  Have you been seen for this problem? 11-1  If your provider decides to give you a prescription, which pharmacy would you like for it to be sent to? [facility] in [place]   Patient informed that this information will be sent to the clinical staff for review and that they should receive a follow up call.

## 2020-11-01 NOTE — Telephone Encounter (Signed)
Doxycycline prescription sent to pharmacy. If if does not improve, needs to be seen.

## 2020-11-09 ENCOUNTER — Other Ambulatory Visit: Payer: Self-pay

## 2020-11-09 DIAGNOSIS — E7800 Pure hypercholesterolemia, unspecified: Secondary | ICD-10-CM

## 2020-11-09 MED ORDER — PRAVASTATIN SODIUM 80 MG PO TABS: 80.0000 mg | ORAL_TABLET | Freq: Every day | ORAL | 0 refills | Status: DC

## 2020-11-09 NOTE — Telephone Encounter (Signed)
Pt says that his mail order is out of pravastatin (PRAVACHOL) 80 MG tablet. He needs a rx sent to [place]. Please call when done.

## 2020-11-09 NOTE — Telephone Encounter (Signed)
Refill of Pravastatin sent to [facility], patient informed

## 2020-12-03 ENCOUNTER — Other Ambulatory Visit: Payer: Self-pay

## 2020-12-03 ENCOUNTER — Ambulatory Visit (INDEPENDENT_AMBULATORY_CARE_PROVIDER_SITE_OTHER): Payer: Medicare Other

## 2020-12-03 DIAGNOSIS — Z23 Encounter for immunization: Secondary | ICD-10-CM

## 2021-01-24 ENCOUNTER — Ambulatory Visit (INDEPENDENT_AMBULATORY_CARE_PROVIDER_SITE_OTHER): Payer: Medicare Other

## 2021-01-24 ENCOUNTER — Encounter: Payer: Self-pay | Admitting: Family Medicine

## 2021-01-24 ENCOUNTER — Other Ambulatory Visit: Payer: Self-pay

## 2021-01-24 ENCOUNTER — Ambulatory Visit (INDEPENDENT_AMBULATORY_CARE_PROVIDER_SITE_OTHER): Payer: Medicare Other | Admitting: Family Medicine

## 2021-01-24 VITALS — BP 145/73 | HR 92 | Ht 67.0 in | Wt 218.0 lb

## 2021-01-24 DIAGNOSIS — N1832 Chronic kidney disease, stage 3b: Secondary | ICD-10-CM | POA: Diagnosis not present

## 2021-01-24 DIAGNOSIS — R0609 Other forms of dyspnea: Secondary | ICD-10-CM

## 2021-01-24 DIAGNOSIS — M545 Low back pain, unspecified: Secondary | ICD-10-CM

## 2021-01-24 DIAGNOSIS — E039 Hypothyroidism, unspecified: Secondary | ICD-10-CM | POA: Diagnosis not present

## 2021-01-24 DIAGNOSIS — N184 Chronic kidney disease, stage 4 (severe): Secondary | ICD-10-CM | POA: Diagnosis not present

## 2021-01-24 DIAGNOSIS — E7800 Pure hypercholesterolemia, unspecified: Secondary | ICD-10-CM

## 2021-01-24 DIAGNOSIS — R0600 Dyspnea, unspecified: Secondary | ICD-10-CM | POA: Diagnosis not present

## 2021-01-24 DIAGNOSIS — R600 Localized edema: Secondary | ICD-10-CM

## 2021-01-24 DIAGNOSIS — E559 Vitamin D deficiency, unspecified: Secondary | ICD-10-CM

## 2021-01-24 DIAGNOSIS — I10 Essential (primary) hypertension: Secondary | ICD-10-CM

## 2021-01-24 DIAGNOSIS — M1A09X0 Idiopathic chronic gout, multiple sites, without tophus (tophi): Secondary | ICD-10-CM

## 2021-01-24 DIAGNOSIS — I129 Hypertensive chronic kidney disease with stage 1 through stage 4 chronic kidney disease, or unspecified chronic kidney disease: Secondary | ICD-10-CM

## 2021-01-24 DIAGNOSIS — Z8546 Personal history of malignant neoplasm of prostate: Secondary | ICD-10-CM

## 2021-01-24 MED ORDER — LEVOTHYROXINE SODIUM 100 MCG PO TABS: 100.0000 ug | ORAL_TABLET | Freq: Every day | ORAL | 3 refills | Status: DC

## 2021-01-24 MED ORDER — VALSARTAN 320 MG PO TABS: 320.0000 mg | ORAL_TABLET | Freq: Every day | ORAL | 3 refills | Status: DC

## 2021-01-24 MED ORDER — HYDROCHLOROTHIAZIDE 25 MG PO TABS: 25.0000 mg | ORAL_TABLET | Freq: Every day | ORAL | 3 refills | Status: DC

## 2021-01-24 MED ORDER — AMLODIPINE BESYLATE 5 MG PO TABS: 7.5000 mg | ORAL_TABLET | Freq: Every day | ORAL | 3 refills | Status: DC

## 2021-01-24 MED ORDER — ALLOPURINOL 100 MG PO TABS: 100.0000 mg | ORAL_TABLET | Freq: Every day | ORAL | 3 refills | Status: DC

## 2021-01-24 MED ORDER — PRAVASTATIN SODIUM 80 MG PO TABS: 80.0000 mg | ORAL_TABLET | Freq: Every day | ORAL | 0 refills | Status: DC

## 2021-01-24 MED ORDER — FENOFIBRATE 160 MG PO TABS: 160.0000 mg | ORAL_TABLET | Freq: Every day | ORAL | 3 refills | Status: DC

## 2021-01-24 NOTE — Progress Notes (Signed)
Subjective: CC: follow up HTN, CKD3b, Hypothyroidism PCP: Janora Norlander, DO UEA:[id number] E Pecha is a 85 y.o. male presenting to clinic today for:  1. Hypertension w/ CKD3B/ DOE Compliant with antihypertensives.  No chest pain, edema or falls.  He does report ongoing dyspnea on exertion since he was ill in the fall.  He had a negative COVID-19 test at that time but notes that it took a while before he really got better.  He notes difficulty ambulating to the mailbox and around the grocery store without becoming winded.  He denies any orthopnea or shortness of breath at rest.  Apparently in the past, he has had some type of genetic testing to look for heart disease that was familial but this was negative.  Last stress test was greater than 4 years ago.  Has never seen a cardiologist  2. Hypothyroidism, acquired History: No history of radiation to the neck.  He has had 2 C-spine surgeries in the past.  No family history of thyroid disease.  Denies any symptomology.  He is compliant with his medications.  3. H/o Prostate cancer Followed by Dr. Jeffie Pollock.    Continues to follow-up with urology as scheduled.  No urinary symptoms reported today.  4.  Chronic low back pain Ongoing low back pain that requires interval injections q. 3 to 4 months.  Last injection was in September.  He did have an exacerbation in his low back pain when he was working in the yard recently.  Unable take NSAIDs secondary to CKD.  ROS: Per HPI  Allergies  Allergen Reactions  . Augmentin [Amoxicillin-Pot Clavulanate] Diarrhea   Past Medical History:  Diagnosis Date  . Cancer ([place])    prostrate  . Chronic kidney disease    kidney stones and 1 smaller kidney, history of a kidney blockage  . Gout   . Hyperlipidemia   . Hypertension    dr Elenore Rota moore  pcp  . Hypothyroidism     Current Outpatient Medications:  .  albuterol (VENTOLIN HFA) 108 (90 Base) MCG/ACT inhaler, Inhale 2 puffs into the lungs every  6 (six) hours as needed for up to 14 days for wheezing or shortness of breath., Disp: 18 g, Rfl: 0 .  allopurinol (ZYLOPRIM) 100 MG tablet, Take 1 tablet (100 mg total) by mouth daily., Disp: 90 tablet, Rfl: 3 .  amLODipine (NORVASC) 5 MG tablet, Take 1.5 tablets (7.5 mg total) by mouth daily., Disp: 135 tablet, Rfl: 3 .  aspirin EC 81 MG tablet, Take 81 mg by mouth daily., Disp: , Rfl:  .  azithromycin (ZITHROMAX) 250 MG tablet, Take 500 mg once, then 250 mg for four days, Disp: 6 tablet, Rfl: 0 .  cholecalciferol (VITAMIN D) 1000 UNITS tablet, Take 1,000 Units by mouth daily., Disp: , Rfl:  .  doxycycline (VIBRA-TABS) 100 MG tablet, Take 1 tablet (100 mg total) by mouth 2 (two) times daily., Disp: 20 tablet, Rfl: 0 .  fenofibrate 160 MG tablet, Take 1 tablet (160 mg total) by mouth daily., Disp: 90 tablet, Rfl: 3 .  fish oil-omega-3 fatty acids 1000 MG capsule, Take 1 g by mouth 2 (two) times daily., Disp: , Rfl:  .  hydrochlorothiazide (HYDRODIURIL) 25 MG tablet, Take 1 tablet (25 mg total) by mouth daily., Disp: 90 tablet, Rfl: 3 .  ipratropium-albuterol (DUONEB) 0.5-2.5 (3) MG/3ML SOLN, Take 3 mLs by nebulization 2 (two) times daily as needed (wheezing, shortness of breath)., Disp: 360 mL, Rfl: 1 .  levothyroxine (  SYNTHROID) 100 MCG tablet, Take 1 tablet (100 mcg total) by mouth daily., Disp: 90 tablet, Rfl: 3 .  pravastatin (PRAVACHOL) 80 MG tablet, Take 1 tablet (80 mg total) by mouth daily., Disp: 90 tablet, Rfl: 0 .  predniSONE (STERAPRED UNI-PAK 21 TAB) 10 MG (21) TBPK tablet, Use as directed, Disp: 21 tablet, Rfl: 0 .  valsartan (DIOVAN) 320 MG tablet, Take 1 tablet (320 mg total) by mouth daily., Disp: 90 tablet, Rfl: 3 .  vitamin C (ASCORBIC ACID) 500 MG tablet, Take 500 mg by mouth daily., Disp: , Rfl:  Social History   Socioeconomic History  . Marital status: Married    Spouse name: Jewel  . Number of children: 1  . Years of education: Not on file  . Highest education level: Not  on file  Occupational History  . 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 Smoking status: Former Smoker    Packs/day: 1.00    Years: 20.00    Pack years: 20.00    Types: Cigarettes    Quit date: 04/01/1977    Years since quitting: 43.8  . Smokeless tobacco: Never Used  Vaping Use  . Vaping Use: Never used  Substance and Sexual Activity  . Alcohol use: No  . Drug use: No  . Sexual activity: Not on file  Other Topics Concern  . Not on file  Social History Narrative  . Not on file   Social Determinants of Health   Financial Resource Strain: Not on file  Food Insecurity: Not on file  Transportation Needs: Not on file  Physical Activity: Not on file  Stress: Not on file  Social Connections: Not on file  Intimate Partner Violence: Not on file   Family History  Problem Relation Age of Onset  . Heart attack Father   . Cancer Brother        unknown origin  . Obesity Maternal Grandmother   . Cancer Paternal Grandfather        possible -- growth kidney   . Anesthesia problems Neg Hx     Objective: Office vital signs reviewed. BP (!) 145/73   Pulse 92   Ht 5\' 7"  (1.702 m)   Wt 218 lb (98.9 kg)   SpO2 100%   BMI 34.14 kg/m   Physical Examination:  General: Awake, alert, well nourished, No acute distress HEENT: Normal, sclera white, MMM; postsurgical scar noted.  No palpable lymph node enlargement within the neck.  No exophthalmos.   Cardio: regular rate and rhythm, S1S2 heard, no murmurs appreciated Pulm: clear to auscultation bilaterally, no wheezes, rhonchi or rales; normal work of breathing on room air GI: Abdomen protuberant. Extremities: warm, well perfused, No edema, cyanosis or clubbing; +2 pulses bilaterally MSK: stiff gait and station; ambulating independently Skin: dry; intact; no rashes or lesions Neuro: No focal neurologic deficits  Assessment/ Plan: 85 y.o. male   Dyspnea on exertion - Plan: EKG 12-Lead, DG  Chest 2 View, EKG 12-Lead  Stage 3b chronic kidney disease ([place]) - Plan: Renal Function Panel, CBC, VITAMIN D 25 Hydroxy (Vit-D Deficiency, Fractures)  Essential hypertension  Acquired hypothyroidism - Plan: Thyroid Panel With TSH  Pure hypercholesterolemia - Plan: Lipid panel, Hepatic Function Panel  Vitamin D deficiency - Plan: VITAMIN D 25 Hydroxy (Vit-D Deficiency, Fractures)  History of prostate cancer - Plan: PSA  Back pain at L4-L5 level  Initial EKG had a bunch of artifact therefore  this was repeated.  This was normal.  I do worry about this new onset dyspnea on exertion.  There is certainly no evidence of previous MI on his EKG but given his renal disease, hypertension and hyperlipidemia he certainly has risk factors.  Chest x-ray was obtained to further evaluate as well.  We will plan to have him see the pulmonologist given dyspnea on exertion.  May benefit from pulmonary function tests.  If that is unremarkable I would certainly have a low threshold for having him see a cardiologist.  Blood pressure was controlled.  Continue current regimen  Asymptomatic from a thyroid standpoint.  Check thyroid panel  Check vitamin D level given history of deficiency and CKD  PSA ordered and CCed to his urologist  Follow-up with specialist for repeat back injection  No orders of the defined types were placed in this encounter.  No orders of the defined types were placed in this encounter.    Janora Norlander, DO [place] [phone]

## 2021-01-24 NOTE — Patient Instructions (Signed)
You had labs performed today.  You will be contacted with the results of the labs once they are available, usually in the next 3 business days for routine lab work.  If you have an active my chart account, they will be released to your MyChart.  If you prefer to have these labs released to you via telephone, please let us know.  If you had a pap smear or biopsy performed, expect to be contacted in about 7-10 days.  We should consider a referral to Pulmonology (lung specialist) or cardiology (heart specialist) since you have been having this sudden shortness of breath with activity.  I am concerned about this.  EKG was performed today as well as labs and a new Chest xray.

## 2021-01-25 ENCOUNTER — Ambulatory Visit (INDEPENDENT_AMBULATORY_CARE_PROVIDER_SITE_OTHER): Payer: Medicare Other | Admitting: *Deleted

## 2021-01-25 DIAGNOSIS — Z0000 Encounter for general adult medical examination without abnormal findings: Secondary | ICD-10-CM | POA: Diagnosis not present

## 2021-01-25 NOTE — Progress Notes (Signed)
MEDICARE ANNUAL WELLNESS VISIT  01/25/2021  Telephone Visit Disclaimer This Medicare AWV was conducted by telephone due to national recommendations for restrictions regarding the COVID-19 Pandemic (e.g. social distancing).  I verified, using two identifiers, that I am speaking with Keith Wiley or their authorized healthcare agent. I discussed the limitations, risks, security, and privacy concerns of performing an evaluation and management service by telephone and the potential availability of an in-person appointment in the future. The patient expressed understanding and agreed to proceed.  Location of Patient: Home Location of Provider (nurse):  [facility]  Subjective:    Keith Wiley is a 85 y.o. male patient of Janora Norlander, DO who had a Medicare Annual Wellness Visit today via telephone. Peng is Retired and lives with their spouse. he has 1 daughter. he reports that he is socially active and does interact with friends/family regularly. he is not physically active and enjoys car shows.  Patient Care Team: Janora Norlander, DO as PCP - General (Family Medicine) Irine Seal, MD as Attending Physician (Urology) Edrick Oh, MD as Consulting Physician (Nephrology) Clarene Essex, MD as Consulting Physician (Gastroenterology)  Kristeen Miss, MD as consulting Physician (Neurosurgery)  Advanced Directives 01/25/2021 07/09/2018 05/14/2012 05/14/2012 05/07/2012  Does Patient Have a Medical Advance Directive? No No Patient does not have advance directive Patient does not have advance directive -  Would patient like information on creating a medical advance directive? No - Patient declined Yes (MAU/Ambulatory/Procedural Areas - Information given) - - -  Pre-existing out of facility DNR order (yellow form or pink MOST form) - - - - No    Hospital Utilization Over the Past 12 Months: # of hospitalizations or ER visits: 0 # of surgeries: 0  Review of  Systems    Patient reports that his overall health is unchanged compared to last year.  History obtained from chart review and the patient Musculoskeletal ROS: positive for - pain in back - bilateral  Patient Reported Readings (BP, Pulse, CBG, Weight, etc) none  Pain Assessment Pain : 0-10 Pain Score: 8  Pain Type: Chronic pain Pain Location: Back Pain Orientation: Left Pain Descriptors / Indicators: Constant,Aching,Burning Pain Onset: More than a month ago Pain Frequency: Constant     Current Medications & Allergies (verified) Allergies as of 01/25/2021      Reactions   Augmentin [amoxicillin-pot Clavulanate] Diarrhea      Medication List       Accurate as of January 25, 2021  1:16 PM. If you have any questions, ask your nurse or doctor.        albuterol 108 (90 Base) MCG/ACT inhaler Commonly known as: VENTOLIN HFA Inhale 2 puffs into the lungs every 6 (six) hours as needed for up to 14 days for wheezing or shortness of breath.   allopurinol 100 MG tablet Commonly known as: ZYLOPRIM Take 1 tablet (100 mg total) by mouth daily.   amLODipine 5 MG tablet Commonly known as: NORVASC Take 1.5 tablets (7.5 mg total) by mouth daily.   aspirin EC 81 MG tablet Take 81 mg by mouth daily.   cholecalciferol 1000 units tablet Commonly known as: VITAMIN D Take 1,000 Units by mouth daily.   fenofibrate 160 MG tablet Take 1 tablet (160 mg total) by mouth daily.   fish oil-omega-3 fatty acids 1000 MG capsule Take 1 g by mouth 2 (two) times daily.   hydrochlorothiazide 25 MG tablet Commonly known as: HYDRODIURIL Take 1 tablet (25  mg total) by mouth daily.   ipratropium-albuterol 0.5-2.5 (3) MG/3ML Soln Commonly known as: DUONEB Take 3 mLs by nebulization 2 (two) times daily as needed (wheezing, shortness of breath).   levothyroxine 100 MCG tablet Commonly known as: SYNTHROID Take 1 tablet (100 mcg total) by mouth daily.   pravastatin 80 MG tablet Commonly known as:  PRAVACHOL Take 1 tablet (80 mg total) by mouth daily.   valsartan 320 MG tablet Commonly known as: DIOVAN Take 1 tablet (320 mg total) by mouth daily.   vitamin C 500 MG tablet Commonly known as: ASCORBIC ACID Take 500 mg by mouth daily.       History (reviewed): Past Medical History:  Diagnosis Date  . Back pain   . Cancer ([place])    prostrate  . Chronic kidney disease    kidney stones and 1 smaller kidney, history of a kidney blockage  . Gout   . Hyperlipidemia   . Hypertension    dr Elenore Rota moore  pcp  . Hypothyroidism    Past Surgical History:  Procedure Laterality Date  . BACK SURGERY     cervical   1989, 2011  . CARPAL TUNNEL RELEASE    . HERNIA REPAIR    . prostrate     seeds inplant   2009   Family History  Problem Relation Age of Onset  . Heart attack Father   . Cancer Brother        unknown origin  . Obesity Maternal Grandmother   . Cancer Paternal Grandfather        possible -- growth kidney   . Anesthesia problems Neg Hx    Social History   Socioeconomic History  . Marital status: Married    Spouse name: Keith Wiley  . Number of children: 1  . Years of education: Not on file  . Highest education level: 12th grade  Occupational History  . 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 Smoking status: Former Smoker    Packs/day: 1.00    Years: 20.00    Pack years: 20.00    Types: Cigarettes    Quit date: 04/01/1977    Years since quitting: 43.8  . Smokeless tobacco: Never Used  Vaping Use  . Vaping Use: Never used  Substance and Sexual Activity  . Alcohol use: No  . Drug use: No  . Sexual activity: Not on file  Other Topics Concern  . Not on file  Social History Narrative  . Not on file   Social Determinants of Health   Financial Resource Strain: Not on file  Food Insecurity: Not on file  Transportation Needs: Not on file  Physical Activity: Not on file  Stress: Not on file  Social Connections: Not  on file    Activities of Daily Living In your present state of health, do you have any difficulty performing the following activities: 01/25/2021  Hearing? N  Vision? N  Comment Wears reading glasses at times  Difficulty concentrating or making decisions? N  Walking or climbing stairs? Y  Comment Due to back pain  Dressing or bathing? N  Doing errands, shopping? N  Preparing Food and eating ? N  Using the Toilet? N  In the past six months, have you accidently leaked urine? N  Do you have problems with loss of bowel control? N  Managing your Medications? N  Managing your Finances? N  Housekeeping or managing your Housekeeping?  N  Some recent data might be hidden    Patient Education/ Literacy How often do you need to have someone help you when you read instructions, pamphlets, or other written materials from your doctor or pharmacy?: 1 - Never What is the last grade level you completed in school?: 12th  Exercise Current Exercise Habits: The patient does not participate in regular exercise at present, Exercise limited by: orthopedic condition(s)  Diet Patient reports consuming 2 meals a day and 1 snack(s) a day Patient reports that his primary diet is: Regular Patient reports that she does have regular access to food.   Depression Screen PHQ 2/9 Scores 01/25/2021 07/19/2020 03/19/2020 07/24/2019 03/12/2019 10/30/2018 08/13/2018  PHQ - 2 Score 0 0 0 0 0 0 0  PHQ- 9 Score - 0 - - - - -     Fall Risk Fall Risk  01/25/2021 01/24/2021 07/19/2020 03/19/2020 07/24/2019  Falls in the past year? 1 1 1 1  0  Number falls in past yr: 0 0 0 0 -  Injury with Fall? 0 0 0 0 -  Risk for fall due to : Impaired balance/gait Impaired balance/gait History of fall(s) No Fall Risks -  Follow up Falls evaluation completed Falls evaluation completed Falls prevention discussed Education provided -     Objective:  Keith Wiley seemed alert and oriented and he participated appropriately during our telephone  visit.  Blood Pressure Weight BMI  BP Readings from Last 3 Encounters:  01/24/21 (!) 145/73  07/19/20 (!) 151/77  03/19/20 137/68   Wt Readings from Last 3 Encounters:  01/24/21 218 lb (98.9 kg)  07/19/20 (!) 214 lb (97.1 kg)  03/19/20 210 lb (95.3 kg)   BMI Readings from Last 1 Encounters:  01/24/21 34.14 kg/m    *Unable to obtain current vital signs, weight, and BMI due to telephone visit type  Hearing/Vision  . Bertis did not seem to have difficulty with hearing/understanding during the telephone conversation . Reports that he has not had a formal eye exam by an eye care professional within the past year . Reports that he has not had a formal hearing evaluation within the past year *Unable to fully assess hearing and vision during telephone visit type  Cognitive Function: 6CIT Screen 01/25/2021  What Year? 0 points  What month? 0 points  What time? 0 points  Count back from 20 0 points  Months in reverse 0 points  Repeat phrase 2 points  Total Score 2   (Normal:0-7, Significant for Dysfunction: >8)  Normal Cognitive Function Screening: Yes   Immunization & Health Maintenance Record Immunization History  Administered Date(s) Administered  . Fluad Quad(high Dose 65+) 11/04/2019, 12/03/2020  . Influenza, High Dose Seasonal PF 10/10/2016, 09/27/2017, 10/30/2018  . Influenza,inj,Quad PF,6+ Mos 10/13/2013, 11/04/2014, 11/09/2015  . Pneumococcal Conjugate-13 10/13/2013  . Pneumococcal Polysaccharide-23 10/30/2018  . Tdap 08/21/2011  . Zoster 08/21/2010    Health Maintenance  Topic Date Due  . COVID-19 Vaccine (1) 07/22/2021 (Originally 04/07/1948)  . TETANUS/TDAP  08/20/2021  . INFLUENZA VACCINE  Completed  . PNA vac Low Risk Adult  Completed       Assessment  This is a routine wellness examination for Keith Wiley.  Health Maintenance: Due or Overdue There are no preventive care reminders to display for this patient.  Keith Wiley does not need a  referral for Community Assistance: Care Management:   no Social Work:    no Prescription Assistance:  no Nutrition/Diabetes Education:  no  Plan:  Personalized Goals Goals Addressed            This Visit's Progress   . AWV       01/25/2021 AWV Goal: Fall Prevention  . Over the next year, patient will decrease their risk for falls by: o Using assistive devices, such as a cane or walker, as needed o Identifying fall risks within their home and correcting them by: - Removing throw rugs - Adding handrails to stairs or ramps - Removing clutter and keeping a clear pathway throughout the home - Increasing light, especially at night - Adding shower handles/bars - Raising toilet seat o Identifying potential personal risk factors for falls: - Medication side effects - Incontinence/urgency - Vestibular dysfunction - Hearing loss - Musculoskeletal disorders - Neurological disorders - Orthostatic hypotension        Personalized Health Maintenance & Screening Recommendations  Yearly Eye Exam COVID vaccine  Lung Cancer Screening Recommended: no (Low Dose CT Chest recommended if Age 18-80 years, 30 pack-year currently smoking OR have quit w/in past 15 years) Hepatitis C Screening recommended: no HIV Screening recommended: no  Advanced Directives: Written information was not prepared per patient's request.  Referrals & Orders No orders of the defined types were placed in this encounter.   Follow-up Plan . Follow-up with Janora Norlander, DO as planned . Schedule your yearly eye exam. . AVS printed and mailed to patient   I have personally reviewed and noted the following in the patient's chart:   . Medical and social history . Use of alcohol, tobacco or illicit drugs  . Current medications and supplements . Functional ability and status . Nutritional status . Physical activity . Advanced directives . List of other physicians . Hospitalizations, surgeries, and ER  visits in previous 12 months . Vitals . Screenings to include cognitive, depression, and falls . Referrals and appointments  In addition, I have reviewed and discussed with Keith Wiley certain preventive protocols, quality metrics, and best practice recommendations. A written personalized care plan for preventive services as well as general preventive health recommendations is available and can be mailed to the patient at his request.      Lynnea Ferrier, LPN  04/25/7781

## 2021-03-07 ENCOUNTER — Telehealth: Payer: Self-pay | Admitting: *Deleted

## 2021-03-07 ENCOUNTER — Telehealth: Payer: Self-pay

## 2021-03-07 ENCOUNTER — Other Ambulatory Visit: Payer: Self-pay | Admitting: Family Medicine

## 2021-03-07 DIAGNOSIS — R600 Localized edema: Secondary | ICD-10-CM

## 2021-03-07 NOTE — Telephone Encounter (Signed)
Future orders were placed back in January.  I have added a BNP.  Please make sure that lab gets all of the January labs as well as the lab added today

## 2021-03-07 NOTE — Telephone Encounter (Signed)
Pt has appt 03/08/21

## 2021-03-07 NOTE — Telephone Encounter (Signed)
Pt has appt tomorrow at 4pm. He is coming at [facility] for labwork, please order any labs that would reflect why his legs are swelling so bad.

## 2021-03-08 ENCOUNTER — Other Ambulatory Visit: Payer: Self-pay

## 2021-03-08 ENCOUNTER — Ambulatory Visit (INDEPENDENT_AMBULATORY_CARE_PROVIDER_SITE_OTHER): Payer: Medicare Other | Admitting: Family Medicine

## 2021-03-08 VITALS — BP 139/73 | HR 90 | Temp 97.4°F | Ht 66.5 in | Wt 224.0 lb

## 2021-03-08 DIAGNOSIS — E7800 Pure hypercholesterolemia, unspecified: Secondary | ICD-10-CM | POA: Diagnosis not present

## 2021-03-08 DIAGNOSIS — R600 Localized edema: Secondary | ICD-10-CM | POA: Diagnosis not present

## 2021-03-08 DIAGNOSIS — N1832 Chronic kidney disease, stage 3b: Secondary | ICD-10-CM | POA: Diagnosis not present

## 2021-03-08 DIAGNOSIS — E559 Vitamin D deficiency, unspecified: Secondary | ICD-10-CM | POA: Diagnosis not present

## 2021-03-08 DIAGNOSIS — E039 Hypothyroidism, unspecified: Secondary | ICD-10-CM | POA: Diagnosis not present

## 2021-03-08 MED ORDER — FUROSEMIDE 40 MG PO TABS: 40.0000 mg | ORAL_TABLET | Freq: Two times a day (BID) | ORAL | 0 refills | Status: DC

## 2021-03-08 NOTE — Addendum Note (Signed)
Addended by: Reather Converse on: 03/08/2021 08:10 AM   Modules accepted: Orders

## 2021-03-08 NOTE — Patient Instructions (Addendum)
HOLD Hydrochlorothiazide  Start furosemide TWICE daily.  Watch weight

## 2021-03-08 NOTE — Progress Notes (Signed)
Subjective: CC: edema PCP: Janora Norlander, DO HDQ:QIWLNL E Keith Wiley is a 85 y.o. male presenting to clinic today for:  1. Edema Patient reports about a 1 month history of edema.  Initially had mild intermittent edema but this is progressively gotten worse and now has progressed beyond his knees and he finds it hard to bend the knee secondary to the swelling.  His weight is up 6 pounds.  He denies any change in breathing, orthopnea, chest pain.  He is compliant with hydrochlorothiazide.  He has medical history significant for CKD 3B but this has been as far as CKD 4 in the past.  He no longer follows nephrology because he had been "stable".   ROS: Per HPI  Allergies  Allergen Reactions  . Augmentin [Amoxicillin-Pot Clavulanate] Diarrhea   Past Medical History:  Diagnosis Date  . Back pain   . Cancer ([place])    prostrate  . Chronic kidney disease    kidney stones and 1 smaller kidney, history of a kidney blockage  . Gout   . Hyperlipidemia   . Hypertension    dr Elenore Rota moore  pcp  . Hypothyroidism     Current Outpatient Medications:  .  allopurinol (ZYLOPRIM) 100 MG tablet, Take 1 tablet (100 mg total) by mouth daily., Disp: 90 tablet, Rfl: 3 .  amLODipine (NORVASC) 5 MG tablet, Take 1.5 tablets (7.5 mg total) by mouth daily., Disp: 135 tablet, Rfl: 3 .  aspirin EC 81 MG tablet, Take 81 mg by mouth daily., Disp: , Rfl:  .  cholecalciferol (VITAMIN D) 1000 UNITS tablet, Take 1,000 Units by mouth daily., Disp: , Rfl:  .  fenofibrate 160 MG tablet, Take 1 tablet (160 mg total) by mouth daily., Disp: 90 tablet, Rfl: 3 .  fish oil-omega-3 fatty acids 1000 MG capsule, Take 1 g by mouth 2 (two) times daily., Disp: , Rfl:  .  hydrochlorothiazide (HYDRODIURIL) 25 MG tablet, Take 1 tablet (25 mg total) by mouth daily., Disp: 90 tablet, Rfl: 3 .  ipratropium-albuterol (DUONEB) 0.5-2.5 (3) MG/3ML SOLN, Take 3 mLs by nebulization 2 (two) times daily as needed (wheezing, shortness of  breath)., Disp: 360 mL, Rfl: 1 .  levothyroxine (SYNTHROID) 100 MCG tablet, Take 1 tablet (100 mcg total) by mouth daily., Disp: 90 tablet, Rfl: 3 .  pravastatin (PRAVACHOL) 80 MG tablet, Take 1 tablet (80 mg total) by mouth daily., Disp: 90 tablet, Rfl: 0 .  valsartan (DIOVAN) 320 MG tablet, Take 1 tablet (320 mg total) by mouth daily., Disp: 90 tablet, Rfl: 3 .  vitamin C (ASCORBIC ACID) 500 MG tablet, Take 500 mg by mouth daily., Disp: , Rfl:  .  albuterol (VENTOLIN HFA) 108 (90 Base) MCG/ACT inhaler, Inhale 2 puffs into the lungs every 6 (six) hours as needed for up to 14 days for wheezing or shortness of breath., Disp: 18 g, Rfl: 0 Social History   Socioeconomic History  . Marital status: Married    Spouse name: Jewel  . Number of children: 1  . Years of education: Not on file  . Highest education level: 12th grade  Occupational History  . 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 Smoking status: Former Smoker    Packs/day: 1.00    Years: 20.00    Pack years: 20.00    Types: Cigarettes    Quit date: 04/01/1977    Years since quitting: 43.9  . Smokeless  tobacco: Never Used  Vaping Use  . Vaping Use: Never used  Substance and Sexual Activity  . Alcohol use: No  . Drug use: No  . Sexual activity: Not on file  Other Topics Concern  . Not on file  Social History Narrative  . Not on file   Social Determinants of Health   Financial Resource Strain: Not on file  Food Insecurity: Not on file  Transportation Needs: Not on file  Physical Activity: Not on file  Stress: Not on file  Social Connections: Not on file  Intimate Partner Violence: Not on file   Family History  Problem Relation Age of Onset  . Heart attack Father   . Cancer Brother        unknown origin  . Obesity Maternal Grandmother   . Cancer Paternal Grandfather        possible -- growth kidney   . Anesthesia problems Neg Hx     Objective: Office vital signs  reviewed. BP 139/73   Pulse 90   Temp (!) 97.4 F (36.3 C) (Temporal)   Ht 5' 6.5" (1.689 m)   Wt 224 lb (101.6 kg)   SpO2 99%   BMI 35.61 kg/m   Physical Examination:  General: Awake, alert, No acute distress HEENT: Normal; no JVD Cardio: regular rate and rhythm, S1S2 heard, no murmurs appreciated Pulm: clear to auscultation bilaterally, no wheezes, rhonchi or rales; normal work of breathing on room air Extremities: warm, well perfused, 1-2+ pitting edema to knees. No cyanosis or clubbing; +2 pulses bilaterally Skin: Stasis dermatitis noted  Assessment/ Plan: 85 y.o. male   Bilateral edema of lower extremity - Plan: furosemide (LASIX) 40 MG tablet  Fluid overload of uncertain etiology.  Clinically he does not appear to be in congestive heart failure.  In fact we obtained EKG and chest x-ray during his last visit.  However, given degree of swelling I do worry that he may have an undiagnosed CHF versus progressed CKD.  Metabolic labs were obtained earlier this morning as well as a BNP.  I have asked that he hold off on his HCTZ for the next couple of days and instead use Lasix twice daily.  Elevation of the lower extremities, avoidance of salt reinforced.  I have scheduled him a 48-hour recheck with my partner, Dr. Warrick Parisian.  If ongoing weight gain or progression symptoms, will need to strongly consider referral.  Recommend recheck BMP in 2 days given use of Lasix, known CKD.  No orders of the defined types were placed in this encounter.  No orders of the defined types were placed in this encounter.    Janora Norlander, DO [place] [phone]

## 2021-03-09 LAB — RENAL FUNCTION PANEL
Albumin: 4.7 g/dL — ABNORMAL HIGH (ref 3.6–4.6)
BUN/Creatinine Ratio: 17 (ref 10–24)
BUN: 33 mg/dL — ABNORMAL HIGH (ref 8–27)
CO2: 21 mmol/L (ref 20–29)
Calcium: 10.2 mg/dL (ref 8.6–10.2)
Chloride: 105 mmol/L (ref 96–106)
Creatinine, Ser: 1.94 mg/dL — ABNORMAL HIGH (ref 0.76–1.27)
Glucose: 100 mg/dL — ABNORMAL HIGH (ref 65–99)
Phosphorus: 3 mg/dL (ref 2.8–4.1)
Potassium: 4.1 mmol/L (ref 3.5–5.2)
Sodium: 144 mmol/L (ref 134–144)
eGFR: 34 mL/min/{1.73_m2} — ABNORMAL LOW (ref >59)

## 2021-03-09 LAB — LIPID PANEL
Chol/HDL Ratio: 2.5 ratio (ref 0.0–5.0)
Cholesterol, Total: 127 mg/dL (ref 100–199)
HDL: 51 mg/dL (ref >39)
LDL Chol Calc (NIH): 58 mg/dL (ref 0–99)
Triglycerides: 95 mg/dL (ref 0–149)
VLDL Cholesterol Cal: 18 mg/dL (ref 5–40)

## 2021-03-09 LAB — HEPATIC FUNCTION PANEL
ALT: 16 IU/L (ref 0–44)
AST: 26 IU/L (ref 0–40)
Alkaline Phosphatase: 51 IU/L (ref 44–121)
Bilirubin Total: 0.5 mg/dL (ref 0.0–1.2)
Bilirubin, Direct: 0.17 mg/dL (ref 0.00–0.40)
Total Protein: 7.4 g/dL (ref 6.0–8.5)

## 2021-03-09 LAB — CBC
Hematocrit: 43.7 % (ref 37.5–51.0)
Hemoglobin: 14.1 g/dL (ref 13.0–17.7)
MCH: 29.7 pg (ref 26.6–33.0)
MCHC: 32.3 g/dL (ref 31.5–35.7)
MCV: 92 fL (ref 79–97)
Platelets: 305 10*3/uL (ref 150–450)
RBC: 4.74 x10E6/uL (ref 4.14–5.80)
RDW: 12.2 % (ref 11.6–15.4)
WBC: 8.3 10*3/uL (ref 3.4–10.8)

## 2021-03-09 LAB — THYROID PANEL WITH TSH
Free Thyroxine Index: 2 (ref 1.2–4.9)
T3 Uptake Ratio: 25 % (ref 24–39)
T4, Total: 8 ug/dL (ref 4.5–12.0)
TSH: 9.97 u[IU]/mL — ABNORMAL HIGH (ref 0.450–4.500)

## 2021-03-09 LAB — VITAMIN D 25 HYDROXY (VIT D DEFICIENCY, FRACTURES): Vit D, 25-Hydroxy: 33.7 ng/mL (ref 30.0–100.0)

## 2021-03-09 LAB — BRAIN NATRIURETIC PEPTIDE: BNP: 34.3 pg/mL (ref 0.0–100.0)

## 2021-03-09 LAB — PSA: Prostate Specific Ag, Serum: <0.1 ng/mL (ref 0.0–4.0)

## 2021-03-10 ENCOUNTER — Other Ambulatory Visit: Payer: Self-pay

## 2021-03-10 ENCOUNTER — Ambulatory Visit (INDEPENDENT_AMBULATORY_CARE_PROVIDER_SITE_OTHER): Payer: Medicare Other | Admitting: Family Medicine

## 2021-03-10 ENCOUNTER — Encounter: Payer: Self-pay | Admitting: Family Medicine

## 2021-03-10 VITALS — BP 134/68 | HR 90 | Ht 66.5 in | Wt 224.0 lb

## 2021-03-10 DIAGNOSIS — R600 Localized edema: Secondary | ICD-10-CM

## 2021-03-10 DIAGNOSIS — I129 Hypertensive chronic kidney disease with stage 1 through stage 4 chronic kidney disease, or unspecified chronic kidney disease: Secondary | ICD-10-CM | POA: Diagnosis not present

## 2021-03-10 DIAGNOSIS — E039 Hypothyroidism, unspecified: Secondary | ICD-10-CM | POA: Diagnosis not present

## 2021-03-10 DIAGNOSIS — N184 Chronic kidney disease, stage 4 (severe): Secondary | ICD-10-CM | POA: Diagnosis not present

## 2021-03-10 MED ORDER — METOPROLOL SUCCINATE ER 50 MG PO TB24: 50.0000 mg | ORAL_TABLET | Freq: Every day | ORAL | 3 refills | Status: DC

## 2021-03-10 MED ORDER — LEVOTHYROXINE SODIUM 112 MCG PO TABS: 112.0000 ug | ORAL_TABLET | Freq: Every day | ORAL | 3 refills | Status: DC

## 2021-03-10 NOTE — Progress Notes (Signed)
BP 134/68   Pulse 90   Ht 5' 6.5" (1.689 m)   Wt 224 lb (101.6 kg)   SpO2 99%   BMI 35.61 kg/m    Subjective:   [name], male    DOB: 1936-09-18, 85 y.o.   MRN: [id number]  HPI: Keith Wiley is a 85 y.o. male presenting on 03/10/2021 for Edema (BLE)   HPI Peripheral edema and CKD Patient has peripheral edema and worsening CKD.  He had to increase his diuretic, he was told to stop his hydrochlorothiazide just recently and his blood pressure looks fine.  We will keep the hydrochlorothiazide off.  Because of the peripheral edema I also want to discontinue the amlodipine and try metoprolol instead to see if it helps with the blood pressures and gets rid another possible source of the edema.  We will also do referral to nephrology.  Patient says it is slightly better with the diuretics doubling up on them.  Patient does get some exertional dyspnea at times but does not know if it is worse or not than what has been previously.  Hypertension Patient is currently on amlodipine and furosemide and valsartan, and their blood pressure today is 134/68. Patient denies any lightheadedness or dizziness. Patient denies headaches, blurred vision, chest pains, shortness of breath, or weakness. Denies any side effects from medication and is content with current medication.   Hypothyroidism recheck Patient is coming in for thyroid recheck today as well. They deny any issues with hair changes or heat or cold problems or diarrhea or constipation. They deny any chest pain or palpitations. They are currently on levothyroxine 100 micrograms   Relevant past medical, surgical, family and social history reviewed and updated as indicated. Interim medical history since our last visit reviewed. Allergies and medications reviewed and updated.  Review of Systems  Constitutional: Negative for chills and fever.  HENT: Negative for congestion.   Eyes: Negative for discharge.  Respiratory:  Positive for shortness of breath (Exertional). Negative for cough and wheezing.   Cardiovascular: Positive for leg swelling. Negative for chest pain.  Musculoskeletal: Negative for back pain and gait problem.  Skin: Negative for rash.  All other systems reviewed and are negative.   Per HPI unless specifically indicated above   Allergies as of 03/10/2021      Reactions   Augmentin [amoxicillin-pot Clavulanate] Diarrhea      Medication List       Accurate as of March 10, 2021 11:50 AM. If you have any questions, ask your nurse or doctor.        STOP taking these medications   amLODipine 5 MG tablet Commonly known as: NORVASC Stopped by: Fransisca Kaufmann Dettinger, MD   hydrochlorothiazide 25 MG tablet Commonly known as: HYDRODIURIL Stopped by: Fransisca Kaufmann Dettinger, MD     TAKE these medications   albuterol 108 (90 Base) MCG/ACT inhaler Commonly known as: VENTOLIN HFA Inhale 2 puffs into the lungs every 6 (six) hours as needed for up to 14 days for wheezing or shortness of breath.   allopurinol 100 MG tablet Commonly known as: ZYLOPRIM Take 1 tablet (100 mg total) by mouth daily.   aspirin EC 81 MG tablet Take 81 mg by mouth daily.   cholecalciferol 1000 units tablet Commonly known as: VITAMIN D Take 1,000 Units by mouth daily.   fenofibrate 160 MG tablet Take 1 tablet (160 mg total) by mouth daily.   fish oil-omega-3 fatty acids 1000 MG capsule Take  1 g by mouth 2 (two) times daily.   furosemide 40 MG tablet Commonly known as: LASIX Take 1 tablet (40 mg total) by mouth 2 (two) times daily. (for 2 days then use ONLY as directed by MD)   ipratropium-albuterol 0.5-2.5 (3) MG/3ML Soln Commonly known as: DUONEB Take 3 mLs by nebulization 2 (two) times daily as needed (wheezing, shortness of breath).   levothyroxine 112 MCG tablet Commonly known as: SYNTHROID Take 1 tablet (112 mcg total) by mouth daily. What changed:   medication strength  how much to take Changed  by: Fransisca Kaufmann Dettinger, MD   metoprolol succinate 50 MG 24 hr tablet Commonly known as: TOPROL-XL Take 1 tablet (50 mg total) by mouth daily. Take with or immediately following a meal. Started by: Fransisca Kaufmann Dettinger, MD   pravastatin 80 MG tablet Commonly known as: PRAVACHOL Take 1 tablet (80 mg total) by mouth daily.   valsartan 320 MG tablet Commonly known as: DIOVAN Take 1 tablet (320 mg total) by mouth daily.   vitamin C 500 MG tablet Commonly known as: ASCORBIC ACID Take 500 mg by mouth daily.        Objective:   BP 134/68   Pulse 90   Ht 5' 6.5" (1.689 m)   Wt 224 lb (101.6 kg)   SpO2 99%   BMI 35.61 kg/m   Wt Readings from Last 3 Encounters:  03/10/21 224 lb (101.6 kg)  03/08/21 224 lb (101.6 kg)  01/24/21 218 lb (98.9 kg)    Physical Exam Vitals and nursing note reviewed.  Constitutional:      General: He is not in acute distress.    Appearance: He is well-developed. He is not diaphoretic.  Eyes:     General: No scleral icterus.    Conjunctiva/sclera: Conjunctivae normal.  Neck:     Thyroid: No thyromegaly.  Cardiovascular:     Rate and Rhythm: Normal rate and regular rhythm.     Heart sounds: Normal heart sounds. No murmur heard.   Pulmonary:     Effort: Pulmonary effort is normal. No respiratory distress.     Breath sounds: Normal breath sounds. No wheezing.  Musculoskeletal:        General: Swelling (2+ pitting edema up to the knee) present. Normal range of motion.     Cervical back: Neck supple.  Lymphadenopathy:     Cervical: No cervical adenopathy.  Skin:    General: Skin is warm and dry.     Findings: No rash.  Neurological:     Mental Status: He is alert and oriented to person, place, and time.     Coordination: Coordination normal.  Psychiatric:        Behavior: Behavior normal.     Results for orders placed or performed in visit on 01/24/21  Lipid panel  Result Value Ref Range   Cholesterol, Total 127 100 - 199 mg/dL    Triglycerides 95 0 - 149 mg/dL   HDL 51 >39 mg/dL   VLDL Cholesterol Cal 18 5 - 40 mg/dL   LDL Chol Calc (NIH) 58 0 - 99 mg/dL   Chol/HDL Ratio 2.5 0.0 - 5.0 ratio  Renal Function Panel  Result Value Ref Range   Glucose 100 (H) 65 - 99 mg/dL   BUN 33 (H) 8 - 27 mg/dL   Creatinine, Ser 1.94 (H) 0.76 - 1.27 mg/dL   eGFR 34 (L) >59 mL/min/1.73   BUN/Creatinine Ratio 17 10 - 24   Sodium 144 134 -  144 mmol/L   Potassium 4.1 3.5 - 5.2 mmol/L   Chloride 105 96 - 106 mmol/L   CO2 21 20 - 29 mmol/L   Calcium 10.2 8.6 - 10.2 mg/dL   Phosphorus 3.0 2.8 - 4.1 mg/dL   Albumin 4.7 (H) 3.6 - 4.6 g/dL  Hepatic Function Panel  Result Value Ref Range   Total Protein 7.4 6.0 - 8.5 g/dL   Bilirubin Total 0.5 0.0 - 1.2 mg/dL   Bilirubin, Direct 0.17 0.00 - 0.40 mg/dL   Alkaline Phosphatase 51 44 - 121 IU/L   AST 26 0 - 40 IU/L   ALT 16 0 - 44 IU/L  CBC  Result Value Ref Range   WBC 8.3 3.4 - 10.8 x10E3/uL   RBC 4.74 4.14 - 5.80 x10E6/uL   Hemoglobin 14.1 13.0 - 17.7 g/dL   Hematocrit 43.7 37.5 - 51.0 %   MCV 92 79 - 97 fL   MCH 29.7 26.6 - 33.0 pg   MCHC 32.3 31.5 - 35.7 g/dL   RDW 12.2 11.6 - 15.4 %   Platelets 305 150 - 450 x10E3/uL  VITAMIN D 25 Hydroxy (Vit-D Deficiency, Fractures)  Result Value Ref Range   Vit D, 25-Hydroxy 33.7 30.0 - 100.0 ng/mL  PSA  Result Value Ref Range   Prostate Specific Ag, Serum <0.1 0.0 - 4.0 ng/mL  Thyroid Panel With TSH  Result Value Ref Range   TSH 9.970 (H) 0.450 - 4.500 uIU/mL   T4, Total 8.0 4.5 - 12.0 ug/dL   T3 Uptake Ratio 25 24 - 39 %   Free Thyroxine Index 2.0 1.2 - 4.9  Brain natriuretic peptide  Result Value Ref Range   BNP 34.3 0.0 - 100.0 pg/mL    Assessment & Plan:   Problem List Items Addressed This Visit      Cardiovascular and Mediastinum   Benign hypertension with chronic kidney disease, stage IV (HCC)   Relevant Medications   metoprolol succinate (TOPROL-XL) 50 MG 24 hr tablet   Other Relevant Orders   BMP8+EGFR    ECHOCARDIOGRAM COMPLETE     Endocrine   Acquired hypothyroidism   Relevant Medications   metoprolol succinate (TOPROL-XL) 50 MG 24 hr tablet   levothyroxine (SYNTHROID) 112 MCG tablet     Genitourinary   Chronic kidney disease (CKD), stage IV (severe) (HCC)   Relevant Orders   BMP8+EGFR   ECHOCARDIOGRAM COMPLETE    Other Visit Diagnoses    Bilateral edema of lower extremity    -  Primary   Relevant Medications   metoprolol succinate (TOPROL-XL) 50 MG 24 hr tablet   Other Relevant Orders   ECHOCARDIOGRAM COMPLETE    Because of peripheral edema will order echocardiogram.  Because of elevated renal function will order a referral for him to go back to nephrology.  With the peripheral edema being a new symptom possibly.  Stop amlodipine and start metoprolol to see if that is a possible cause of peripheral edema.  Because of thyroid being off and he says that he has been taking consistently we will increase his thyroid dose.  Follow up plan: Return in about 4 weeks (around 04/07/2021), or if symptoms worsen or fail to improve, for Follow-up hypertension and thyroid and edema.  Counseling provided for all of the vaccine components Orders Placed This Encounter  Procedures  . BMP8+EGFR  . ECHOCARDIOGRAM COMPLETE    Caryl Pina, MD [facility] 03/10/2021, 11:50 AM

## 2021-03-10 NOTE — Addendum Note (Signed)
Addended by: Alphonzo Dublin on: 03/10/2021 11:59 AM   Modules accepted: Orders

## 2021-03-11 ENCOUNTER — Other Ambulatory Visit: Payer: Self-pay

## 2021-03-11 DIAGNOSIS — N184 Chronic kidney disease, stage 4 (severe): Secondary | ICD-10-CM

## 2021-03-11 DIAGNOSIS — M4150 Other secondary scoliosis, site unspecified: Secondary | ICD-10-CM | POA: Diagnosis not present

## 2021-03-11 LAB — BMP8+EGFR
BUN/Creatinine Ratio: 18 (ref 10–24)
BUN: 42 mg/dL — ABNORMAL HIGH (ref 8–27)
CO2: 21 mmol/L (ref 20–29)
Calcium: 10 mg/dL (ref 8.6–10.2)
Chloride: 100 mmol/L (ref 96–106)
Creatinine, Ser: 2.32 mg/dL — ABNORMAL HIGH (ref 0.76–1.27)
Glucose: 109 mg/dL — ABNORMAL HIGH (ref 65–99)
Potassium: 4 mmol/L (ref 3.5–5.2)
Sodium: 141 mmol/L (ref 134–144)
eGFR: 27 mL/min/{1.73_m2} — ABNORMAL LOW (ref >59)

## 2021-03-27 ENCOUNTER — Other Ambulatory Visit: Payer: Self-pay | Admitting: Family Medicine

## 2021-03-27 DIAGNOSIS — E7800 Pure hypercholesterolemia, unspecified: Secondary | ICD-10-CM

## 2021-04-04 DIAGNOSIS — M5136 Other intervertebral disc degeneration, lumbar region: Secondary | ICD-10-CM | POA: Diagnosis not present

## 2021-04-04 DIAGNOSIS — M5416 Radiculopathy, lumbar region: Secondary | ICD-10-CM | POA: Diagnosis not present

## 2021-04-05 DIAGNOSIS — N200 Calculus of kidney: Secondary | ICD-10-CM | POA: Diagnosis not present

## 2021-04-05 DIAGNOSIS — N184 Chronic kidney disease, stage 4 (severe): Secondary | ICD-10-CM | POA: Diagnosis not present

## 2021-04-05 DIAGNOSIS — R809 Proteinuria, unspecified: Secondary | ICD-10-CM | POA: Diagnosis not present

## 2021-04-05 DIAGNOSIS — I129 Hypertensive chronic kidney disease with stage 1 through stage 4 chronic kidney disease, or unspecified chronic kidney disease: Secondary | ICD-10-CM | POA: Diagnosis not present

## 2021-04-05 DIAGNOSIS — R609 Edema, unspecified: Secondary | ICD-10-CM | POA: Diagnosis not present

## 2021-04-07 ENCOUNTER — Encounter: Payer: Self-pay | Admitting: Family Medicine

## 2021-04-07 ENCOUNTER — Ambulatory Visit (INDEPENDENT_AMBULATORY_CARE_PROVIDER_SITE_OTHER): Payer: Medicare Other | Admitting: Family Medicine

## 2021-04-07 ENCOUNTER — Other Ambulatory Visit: Payer: Self-pay

## 2021-04-07 VITALS — BP 154/78 | HR 89 | Ht 66.5 in | Wt 218.0 lb

## 2021-04-07 DIAGNOSIS — I129 Hypertensive chronic kidney disease with stage 1 through stage 4 chronic kidney disease, or unspecified chronic kidney disease: Secondary | ICD-10-CM

## 2021-04-07 DIAGNOSIS — N184 Chronic kidney disease, stage 4 (severe): Secondary | ICD-10-CM

## 2021-04-07 DIAGNOSIS — R600 Localized edema: Secondary | ICD-10-CM

## 2021-04-07 NOTE — Progress Notes (Signed)
BP (!) 155/80   Pulse 89   Ht 5' 6.5" (1.689 m)   Wt 218 lb (98.9 kg)   SpO2 100%   BMI 34.66 kg/m    Subjective:   [name], male    DOB: 1936-11-17, 85 y.o.   MRN: [id number]  HPI: Keith Wiley is a 85 y.o. male presenting on 04/07/2021 for Leg Swelling   HPI Patient is coming in for follow-up for hypertension swelling in his legs.  He says the swelling is gone down significantly.  He feels like he had itching all over from when he was taking the furosemide so he stopped it and restarted his hydrochlorothiazide and amlodipine.  He has since followed up with nephrology just 2 days ago and they were content with where his levels were and said he could continue both the amlodipine and the hydrochlorothiazide.  BP is slightly up today but he says it runs a lot better at home.  Relevant past medical, surgical, family and social history reviewed and updated as indicated. Interim medical history since our last visit reviewed. Allergies and medications reviewed and updated.  Review of Systems  Constitutional: Negative for chills and fever.  Eyes: Negative for visual disturbance.  Respiratory: Negative for shortness of breath and wheezing.   Cardiovascular: Negative for chest pain and leg swelling.  Musculoskeletal: Negative for back pain and gait problem.  Skin: Negative for rash.  Neurological: Negative for dizziness, weakness and light-headedness.  All other systems reviewed and are negative.   Per HPI unless specifically indicated above   Allergies as of 04/07/2021      Reactions   Augmentin [amoxicillin-pot Clavulanate] Diarrhea      Medication List       Accurate as of April 07, 2021 11:21 AM. If you have any questions, ask your nurse or doctor.        STOP taking these medications   furosemide 40 MG tablet Commonly known as: LASIX Stopped by: Fransisca Kaufmann Rickardo Brinegar, MD   metoprolol succinate 50 MG 24 hr tablet Commonly known as:  TOPROL-XL Stopped by: Worthy Rancher, MD     TAKE these medications   albuterol 108 (90 Base) MCG/ACT inhaler Commonly known as: VENTOLIN HFA Inhale 2 puffs into the lungs every 6 (six) hours as needed for up to 14 days for wheezing or shortness of breath.   allopurinol 100 MG tablet Commonly known as: ZYLOPRIM Take 1 tablet (100 mg total) by mouth daily.   aspirin EC 81 MG tablet Take 81 mg by mouth daily.   cholecalciferol 1000 units tablet Commonly known as: VITAMIN D Take 1,000 Units by mouth daily.   fenofibrate 160 MG tablet Take 1 tablet (160 mg total) by mouth daily.   fish oil-omega-3 fatty acids 1000 MG capsule Take 1 g by mouth 2 (two) times daily.   ipratropium-albuterol 0.5-2.5 (3) MG/3ML Soln Commonly known as: DUONEB Take 3 mLs by nebulization 2 (two) times daily as needed (wheezing, shortness of breath).   levothyroxine 112 MCG tablet Commonly known as: SYNTHROID Take 1 tablet (112 mcg total) by mouth daily.   pravastatin 80 MG tablet Commonly known as: PRAVACHOL TAKE 1 TABLET BY MOUTH  DAILY   valsartan 320 MG tablet Commonly known as: DIOVAN Take 1 tablet (320 mg total) by mouth daily.   vitamin C 500 MG tablet Commonly known as: ASCORBIC ACID Take 500 mg by mouth daily.        Objective:  BP (!) 155/80   Pulse 89   Ht 5' 6.5" (1.689 m)   Wt 218 lb (98.9 kg)   SpO2 100%   BMI 34.66 kg/m   Wt Readings from Last 3 Encounters:  04/07/21 218 lb (98.9 kg)  03/10/21 224 lb (101.6 kg)  03/08/21 224 lb (101.6 kg)    Physical Exam Vitals and nursing note reviewed.  Constitutional:      General: He is not in acute distress.    Appearance: He is well-developed. He is not diaphoretic.  Eyes:     General: No scleral icterus.    Conjunctiva/sclera: Conjunctivae normal.  Neck:     Thyroid: No thyromegaly.  Cardiovascular:     Rate and Rhythm: Normal rate and regular rhythm.     Heart sounds: Normal heart sounds. No murmur  heard.   Pulmonary:     Effort: Pulmonary effort is normal. No respiratory distress.     Breath sounds: Normal breath sounds. No wheezing.  Musculoskeletal:        General: Swelling (Trace edema in bilateral lower extremities) present. Normal range of motion.     Cervical back: Neck supple.  Lymphadenopathy:     Cervical: No cervical adenopathy.  Skin:    General: Skin is warm and dry.     Findings: No rash.  Neurological:     Mental Status: He is alert and oriented to person, place, and time.     Coordination: Coordination normal.  Psychiatric:        Behavior: Behavior normal.       Assessment & Plan:   Problem List Items Addressed This Visit      Cardiovascular and Mediastinum   Benign hypertension with chronic kidney disease, stage IV (HCC) - Primary   Relevant Medications   amLODipine (NORVASC) 5 MG tablet   hydrochlorothiazide (HYDRODIURIL) 25 MG tablet    Other Visit Diagnoses    Bilateral edema of lower extremity          Patient saw nephrology and he had gone back on his amlodipine and hydrochlorothiazide and when he saw nephrology 2 days ago they said they were content with renal follow-up in 3 months but to continue the amlodipine and hydrochlorothiazide for now.  His swelling is gone and he is feeling very good. Follow up plan: Return if symptoms worsen or fail to improve, for 1 to 23-month hypertension follow-up with PCP.Marland Kitchen  Counseling provided for all of the vaccine components No orders of the defined types were placed in this encounter.   Caryl Pina, MD [facility] 04/07/2021, 11:21 AM

## 2021-04-19 ENCOUNTER — Ambulatory Visit (HOSPITAL_COMMUNITY)
Admission: RE | Admit: 2021-04-19 | Discharge: 2021-04-19 | Disposition: A | Discharge status: Home / Self Care | Payer: Medicare Other | Source: Ambulatory Visit | Attending: Family Medicine | Admitting: Family Medicine

## 2021-04-19 ENCOUNTER — Other Ambulatory Visit: Payer: Self-pay

## 2021-04-19 DIAGNOSIS — I129 Hypertensive chronic kidney disease with stage 1 through stage 4 chronic kidney disease, or unspecified chronic kidney disease: Secondary | ICD-10-CM | POA: Insufficient documentation

## 2021-04-19 DIAGNOSIS — R600 Localized edema: Secondary | ICD-10-CM | POA: Insufficient documentation

## 2021-04-19 DIAGNOSIS — N184 Chronic kidney disease, stage 4 (severe): Secondary | ICD-10-CM | POA: Insufficient documentation

## 2021-04-19 LAB — ECHOCARDIOGRAM COMPLETE
AR max vel: 1.97 cm2
AV Area VTI: 2.17 cm2
AV Area mean vel: 1.99 cm2
AV Mean grad: 5.5 mmHg
AV Peak grad: 11.2 mmHg
Ao pk vel: 1.68 m/s
Area-P 1/2: 4.06 cm2
S' Lateral: 2.86 cm

## 2021-04-19 NOTE — Progress Notes (Signed)
  Echocardiogram 2D Echocardiogram has been performed.  Keith Wiley 04/19/2021, 12:26 PM

## 2021-05-15 ENCOUNTER — Other Ambulatory Visit: Payer: Self-pay | Admitting: Family Medicine

## 2021-05-15 DIAGNOSIS — E7800 Pure hypercholesterolemia, unspecified: Secondary | ICD-10-CM

## 2021-05-18 ENCOUNTER — Ambulatory Visit (INDEPENDENT_AMBULATORY_CARE_PROVIDER_SITE_OTHER): Payer: Medicare Other | Admitting: Family Medicine

## 2021-05-18 ENCOUNTER — Other Ambulatory Visit: Payer: Self-pay

## 2021-05-18 ENCOUNTER — Encounter: Payer: Self-pay | Admitting: Family Medicine

## 2021-05-18 DIAGNOSIS — J069 Acute upper respiratory infection, unspecified: Secondary | ICD-10-CM | POA: Diagnosis not present

## 2021-05-18 LAB — VERITOR FLU A/B WAIVED
Influenza A: NEGATIVE
Influenza B: NEGATIVE

## 2021-05-18 MED ORDER — BENZONATATE 100 MG PO CAPS: 100.0000 mg | ORAL_CAPSULE | Freq: Three times a day (TID) | ORAL | 0 refills | Status: DC | PRN

## 2021-05-18 NOTE — Addendum Note (Signed)
Addended by: Liliane Bade on: 05/18/2021 08:54 AM   Modules accepted: Orders

## 2021-05-18 NOTE — Progress Notes (Signed)
   Virtual Visit  Note Due to COVID-19 pandemic this visit was conducted virtually. This visit type was conducted due to national recommendations for restrictions regarding the COVID-19 Pandemic (e.g. social distancing, sheltering in place) in an effort to limit this patient's exposure and mitigate transmission in our community. All issues noted in this document were discussed and addressed.  A physical exam was not performed with this format.  I connected with Keith Wiley on 05/18/21 at [phone] by telephone and verified that I am speaking with the correct person using two identifiers. Keith Wiley is currently located at home and no one is currently with him during the visit. The provider, Keith Perking, FNP is located in their office at time of visit.  I discussed the limitations, risks, security and privacy concerns of performing an evaluation and management service by telephone and the availability of in person appointments. I also discussed with the patient that there may be a patient responsible charge related to this service. The patient expressed understanding and agreed to proceed.  CC: cough  History and Present Illness:  HPI  Keith Wiley reports that he was exposed to someone who tested positive for Covid last week. He started having a cough and congestion 5 days ago and would like to get tested. He also reports a headache and sneezing. He denies fever, shortness of breath, chest pain, nausea, vomiting, diarrhea, abdominal pain, body aches, chills, sore throat, or ear pain. He has been taking alka-seltzer cold for him symptoms with some improvement.    ROS As per HPI.   Observations/Objective: Alert and oriented x 3. Able to speak in full sentences without difficulty.    Assessment and Plan: Ruxin was seen today for cough.  Diagnoses and all orders for this visit:  Viral URI with cough Testing ordered as below, will notify patient of results. Quarantine until [employer].  Mucinex for congestion, tessalon perles for cough. Rest, hydrate. Return to office for new or worsening symptoms.  -     benzonatate (TESSALON PERLES) 100 MG capsule; Take 1 capsule (100 mg total) by mouth 3 (three) times daily as needed for cough. -     Veritor Flu A/B Waived -     Novel Coronavirus, NAA ([facility]); Future     Follow Up Instructions: As needed.     I discussed the assessment and treatment plan with the patient. The patient was provided an opportunity to ask questions and all were answered. The patient agreed with the plan and demonstrated an understanding of the instructions.   The patient was advised to call back or seek an in-person evaluation if the symptoms worsen or if the condition fails to improve as anticipated.  The above assessment and management plan was discussed with the patient. The patient verbalized understanding of and has agreed to the management plan. Patient is aware to call the clinic if symptoms persist or worsen. Patient is aware when to return to the clinic for a follow-up visit. Patient educated on when it is appropriate to go to the emergency department.   Time call ended:  0830  I provided 11 minutes of  non face-to-face time during this encounter.    Keith Perking, FNP

## 2021-05-19 LAB — NOVEL CORONAVIRUS, NAA: SARS-CoV-2, NAA: NOT DETECTED

## 2021-05-19 LAB — SARS-COV-2, NAA 2 DAY TAT

## 2021-05-27 ENCOUNTER — Telehealth: Payer: Self-pay | Admitting: Family Medicine

## 2021-05-27 DIAGNOSIS — E039 Hypothyroidism, unspecified: Secondary | ICD-10-CM

## 2021-05-27 MED ORDER — LEVOTHYROXINE SODIUM 112 MCG PO TABS: 112.0000 ug | ORAL_TABLET | Freq: Every day | ORAL | 2 refills | Status: DC

## 2021-05-27 NOTE — Telephone Encounter (Signed)
  Prescription Request  05/27/2021  What is the name of the medication or equipment? levothyroine  Have you contacted your pharmacy to request a refill? (if applicable) yes  Which pharmacy would you like this sent to? [facility]   Patient notified that their request is being sent to the clinical staff for review and that they should receive a response within 2 business days.

## 2021-05-27 NOTE — Telephone Encounter (Signed)
Pt aware refill sent to pharmacy 

## 2021-07-08 DIAGNOSIS — M48062 Spinal stenosis, lumbar region with neurogenic claudication: Secondary | ICD-10-CM | POA: Diagnosis not present

## 2021-07-15 ENCOUNTER — Other Ambulatory Visit (HOSPITAL_COMMUNITY): Payer: Self-pay | Admitting: Neurological Surgery

## 2021-07-15 DIAGNOSIS — M4150 Other secondary scoliosis, site unspecified: Secondary | ICD-10-CM

## 2021-07-26 ENCOUNTER — Other Ambulatory Visit: Payer: Self-pay

## 2021-07-26 ENCOUNTER — Encounter: Payer: Self-pay | Admitting: Family Medicine

## 2021-07-26 ENCOUNTER — Ambulatory Visit (INDEPENDENT_AMBULATORY_CARE_PROVIDER_SITE_OTHER): Payer: Medicare Other | Admitting: Family Medicine

## 2021-07-26 VITALS — BP 144/86 | HR 90 | Temp 97.1°F | Ht 66.5 in | Wt 218.6 lb

## 2021-07-26 DIAGNOSIS — E7800 Pure hypercholesterolemia, unspecified: Secondary | ICD-10-CM | POA: Diagnosis not present

## 2021-07-26 DIAGNOSIS — Z8546 Personal history of malignant neoplasm of prostate: Secondary | ICD-10-CM | POA: Diagnosis not present

## 2021-07-26 DIAGNOSIS — E039 Hypothyroidism, unspecified: Secondary | ICD-10-CM

## 2021-07-26 DIAGNOSIS — M5441 Lumbago with sciatica, right side: Secondary | ICD-10-CM

## 2021-07-26 DIAGNOSIS — N184 Chronic kidney disease, stage 4 (severe): Secondary | ICD-10-CM

## 2021-07-26 DIAGNOSIS — Z0001 Encounter for general adult medical examination with abnormal findings: Secondary | ICD-10-CM | POA: Diagnosis not present

## 2021-07-26 DIAGNOSIS — I129 Hypertensive chronic kidney disease with stage 1 through stage 4 chronic kidney disease, or unspecified chronic kidney disease: Secondary | ICD-10-CM | POA: Diagnosis not present

## 2021-07-26 DIAGNOSIS — Z0000 Encounter for general adult medical examination without abnormal findings: Secondary | ICD-10-CM

## 2021-07-26 MED ORDER — TIZANIDINE HCL 2 MG PO TABS: 2.0000 mg | ORAL_TABLET | Freq: Every evening | ORAL | 0 refills | Status: DC | PRN

## 2021-07-26 NOTE — Progress Notes (Signed)
Keith Wiley is a 85 y.o. male presents to office today for annual physical exam examination.    Concerns today include: 1.  Low back pain Patient has been struggling with low back pain radiating to the right lower extremity for quite some time now.  He is status post injection therapy, corticosteroid injection intramuscularly here and has been using Tylenol in efforts to control pain.  He is not able to take NSAIDs secondary to CKD 4 but admits that he is snuck a few Aleve intermittently to try and control pain.  Not currently treated with a muscle relaxer.  Denies any falls but admits that the right leg is difficult to lift secondary to pain.  Occupation: retired  Diet: Fair, Exercise: None currently due to pain and back Last eye exam: Needs to schedule Last dental exam: Has falls uppers and lowers Last colonoscopy: Up-to-date Refills needed today: None Immunizations needed:  Immunization History  Administered Date(s) Administered   Fluad Quad(high Dose 65+) 11/04/2019, 12/03/2020   Influenza, High Dose Seasonal PF 10/10/2016, 09/27/2017, 10/30/2018   Influenza,inj,Quad PF,6+ Mos 10/13/2013, 11/04/2014, 11/09/2015   Pneumococcal Conjugate-13 10/13/2013   Pneumococcal Polysaccharide-23 10/30/2018   Tdap 08/21/2011   Zoster, Live 08/21/2010     Past Medical History:  Diagnosis Date   Back pain    Cancer ([place])    prostrate   Chronic kidney disease    kidney stones and 1 smaller kidney, history of a kidney blockage   Gout    Hyperlipidemia    Hypertension    dr Elenore Rota moore  pcp   Hypothyroidism    Social History   Socioeconomic History   Marital status: Married    Spouse name: Jewel   Number of children: 1   Years of education: Not on file   Highest education level: 12th grade  Occupational History   Occupation: retired    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   Tobacco Use   Smoking status: Former    Packs/day: 1.00    Years: 20.00    Pack years: 20.00     Types: Cigarettes    Quit date: 04/01/1977    Years since quitting: 44.3   Smokeless tobacco: Never  Vaping Use   Vaping Use: Never used  Substance and Sexual Activity   Alcohol use: No   Drug use: No   Sexual activity: Not on file  Other Topics Concern   Not on file  Social History Narrative   Not on file   Social Determinants of Health   Financial Resource Strain: Not on file  Food Insecurity: Not on file  Transportation Needs: Not on file  Physical Activity: Not on file  Stress: Not on file  Social Connections: Not on file  Intimate Partner Violence: Not on file   Past Surgical History:  Procedure Laterality Date   BACK SURGERY     cervical   1989, 2011   [place]     prostrate     seeds inplant   2009   Family History  Problem Relation Age of Onset   Heart attack Father    Cancer Brother        unknown origin   Obesity Maternal Grandmother    Cancer Paternal Grandfather        possible -- growth kidney    Anesthesia problems Neg Hx     Current Outpatient Medications:    acetaminophen (TYLENOL) 500  MG tablet, Take 500 mg by mouth every 6 (six) hours as needed for moderate pain., Disp: , Rfl:    allopurinol (ZYLOPRIM) 100 MG tablet, Take 1 tablet (100 mg total) by mouth daily., Disp: 90 tablet, Rfl: 3   amLODipine (NORVASC) 5 MG tablet, Take 7.5 mg by mouth daily., Disp: , Rfl:    fenofibrate 160 MG tablet, Take 1 tablet (160 mg total) by mouth daily., Disp: 90 tablet, Rfl: 3   fish oil-omega-3 fatty acids 1000 MG capsule, Take 1 g by mouth 2 (two) times daily., Disp: , Rfl:    hydrochlorothiazide (HYDRODIURIL) 25 MG tablet, Take 25 mg by mouth daily., Disp: , Rfl:    hydrocortisone cream 1 %, Apply 1 application topically daily as needed for itching., Disp: , Rfl:    levothyroxine (SYNTHROID) 112 MCG tablet, Take 1 tablet (112 mcg total) by mouth daily., Disp: 90 tablet, Rfl: 2   pravastatin (PRAVACHOL) 80 MG tablet, TAKE 1  TABLET BY MOUTH  DAILY (Patient taking differently: Take 80 mg by mouth daily.), Disp: 90 tablet, Rfl: 0   sodium chloride (OCEAN) 0.65 % SOLN nasal spray, Place 1 spray into both nostrils daily., Disp: , Rfl:    valsartan (DIOVAN) 320 MG tablet, Take 1 tablet (320 mg total) by mouth daily., Disp: 90 tablet, Rfl: 3   vitamin C (ASCORBIC ACID) 500 MG tablet, Take 500 mg by mouth daily., Disp: , Rfl:    aspirin EC 81 MG tablet, Take 81 mg by mouth daily. (Patient not taking: Reported on 07/26/2021), Disp: , Rfl:    aspirin-sod bicarb-citric acid (ALKA-SELTZER) 325 MG TBEF tablet, Take 650 mg by mouth every 6 (six) hours as needed (indigestion). (Patient not taking: Reported on 07/26/2021), Disp: , Rfl:    Cholecalciferol (VITAMIN D) 50 MCG (2000 UT) tablet, Take 2,000 Units by mouth daily. (Patient not taking: Reported on 07/26/2021), Disp: , Rfl:   Allergies  Allergen Reactions   Augmentin [Amoxicillin-Pot Clavulanate] Diarrhea     ROS: Review of Systems Pertinent items noted in HPI and remainder of comprehensive ROS otherwise negative.    Physical exam BP (!) 144/86   Pulse 90   Temp (!) 97.1 F (36.2 C)   Ht 5' 6.5" (1.689 m)   Wt 218 lb 9.6 oz (99.2 kg)   SpO2 96%   BMI 34.75 kg/m  General appearance: alert, cooperative, appears stated age, no distress, and mildly obese Head: Normocephalic, without obvious abnormality, atraumatic Eyes: negative findings: lids and lashes normal, conjunctivae and sclerae normal, corneas clear, and pupils equal, round, reactive to light and accomodation Ears: normal TM's and external ear canals both ears Nose: Nares normal. Septum midline. Mucosa normal. No drainage or sinus tenderness. Throat:  Moist mucous membranes.  False uppers and lowers Neck: no adenopathy, no carotid bruit, supple, symmetrical, trachea midline, and well-healed scar noted at the base of the neck Back:  No midline tenderness but patient has hunched station and antalgic gait  noted Lungs: clear to auscultation bilaterally Chest wall: no tenderness Heart: regular rate and rhythm, S1, S2 normal, no murmur, click, rub or gallop Abdomen: soft, non-tender; bowel sounds normal; no masses,  no organomegaly and obese Extremities: extremities normal, atraumatic, no cyanosis or edema Pulses: 2+ and symmetric Skin: Skin color, texture, turgor normal. No rashes or lesions Lymph nodes: Cervical, supraclavicular, and axillary nodes normal. Neurologic: Grossly normal; patellar DTRs 1 out of 4 bilaterally.  Light touch and station grossly intact to the lower extremities bilaterally Psych:  Mood stable, speech normal Depression screen [facility] 2/9 07/26/2021 04/07/2021 03/10/2021  Decreased Interest 0 0 0  Down, Depressed, Hopeless 0 0 0  PHQ - 2 Score 0 0 0  Altered sleeping - - -  Tired, decreased energy - - -  Change in appetite - - -  Feeling bad or failure about yourself  - - -  Trouble concentrating - - -  Moving slowly or fidgety/restless - - -  Suicidal thoughts - - -  PHQ-9 Score - - -  Difficult doing work/chores - - -    Assessment/ Plan: [name] here for annual physical exam.   Annual physical exam  Benign hypertension with chronic kidney disease, stage IV ([place]) - Plan: Renal Function Panel, Uric Acid  Pure hypercholesterolemia  History of prostate cancer - Plan: PSA  Acquired hypothyroidism - Plan: TSH, T4, Free  Acute right-sided low back pain with right-sided sciatica - Plan: tiZANidine (ZANAFLEX) 2 MG tablet  Up-to-date on preventative care services.  Plan for influenza vaccination in a few months.  I read his most recent nephrology notes and it looks like labs are anticipated.  Have gone ahead and check renal function panel, uric acid and CCed this to Dr. Johnney Ou.  Not yet due for fasting lipid panel.  Continue current regimen.  Last lipid was well controlled  We will collect PSA in anticipation of his appointment with Dr. Jeffie Pollock  Asymptomatic  from a thyroid standpoint.  Check TSH, free T4  Renally dosed tizanidine given for acute back pain not relieved by Tylenol and corticosteroid injection.  Avoid NSAIDs given impaired renal function.  Has imaging scheduled for Monday  Follow-up in 6 months for recheck of thyroid, sooner if needed  Alexys Gassett M. Lajuana Ripple, DO

## 2021-07-27 ENCOUNTER — Ambulatory Visit (HOSPITAL_COMMUNITY): Admission: RE | Admit: 2021-07-27 | Payer: Medicare Other | Source: Ambulatory Visit

## 2021-07-27 ENCOUNTER — Ambulatory Visit (HOSPITAL_COMMUNITY): Payer: Medicare Other

## 2021-07-27 ENCOUNTER — Encounter (HOSPITAL_COMMUNITY): Payer: Self-pay

## 2021-07-27 LAB — T4, FREE: Free T4: 1.51 ng/dL (ref 0.82–1.77)

## 2021-07-27 LAB — RENAL FUNCTION PANEL
Albumin: 4.6 g/dL (ref 3.6–4.6)
BUN/Creatinine Ratio: 17 (ref 10–24)
BUN: 29 mg/dL — ABNORMAL HIGH (ref 8–27)
CO2: 21 mmol/L (ref 20–29)
Calcium: 10.2 mg/dL (ref 8.6–10.2)
Chloride: 107 mmol/L — ABNORMAL HIGH (ref 96–106)
Creatinine, Ser: 1.7 mg/dL — ABNORMAL HIGH (ref 0.76–1.27)
Glucose: 103 mg/dL — ABNORMAL HIGH (ref 65–99)
Phosphorus: 2.6 mg/dL — ABNORMAL LOW (ref 2.8–4.1)
Potassium: 4.2 mmol/L (ref 3.5–5.2)
Sodium: 143 mmol/L (ref 134–144)
eGFR: 40 mL/min/{1.73_m2} — ABNORMAL LOW (ref >59)

## 2021-07-27 LAB — PSA: Prostate Specific Ag, Serum: <0.1 ng/mL (ref 0.0–4.0)

## 2021-07-27 LAB — TSH: TSH: 3.06 u[IU]/mL (ref 0.450–4.500)

## 2021-07-27 LAB — URIC ACID: Uric Acid: 8.3 mg/dL (ref 3.8–8.4)

## 2021-07-27 NOTE — Progress Notes (Signed)
Pt aware.

## 2021-08-01 ENCOUNTER — Ambulatory Visit (HOSPITAL_COMMUNITY)
Admission: RE | Admit: 2021-08-01 | Discharge: 2021-08-01 | Disposition: A | Discharge status: Home / Self Care | Payer: Medicare Other | Source: Ambulatory Visit | Attending: Neurological Surgery | Admitting: Neurological Surgery

## 2021-08-01 ENCOUNTER — Other Ambulatory Visit: Payer: Self-pay

## 2021-08-01 DIAGNOSIS — M4150 Other secondary scoliosis, site unspecified: Secondary | ICD-10-CM

## 2021-08-01 DIAGNOSIS — M4156 Other secondary scoliosis, lumbar region: Secondary | ICD-10-CM | POA: Diagnosis not present

## 2021-08-01 DIAGNOSIS — I700 Atherosclerosis of aorta: Secondary | ICD-10-CM | POA: Diagnosis not present

## 2021-08-01 DIAGNOSIS — M5136 Other intervertebral disc degeneration, lumbar region: Secondary | ICD-10-CM | POA: Diagnosis not present

## 2021-08-01 DIAGNOSIS — M6281 Muscle weakness (generalized): Secondary | ICD-10-CM | POA: Insufficient documentation

## 2021-08-01 DIAGNOSIS — M48062 Spinal stenosis, lumbar region with neurogenic claudication: Secondary | ICD-10-CM | POA: Insufficient documentation

## 2021-08-01 DIAGNOSIS — M4804 Spinal stenosis, thoracic region: Secondary | ICD-10-CM | POA: Insufficient documentation

## 2021-08-01 DIAGNOSIS — M4186 Other forms of scoliosis, lumbar region: Secondary | ICD-10-CM | POA: Diagnosis not present

## 2021-08-01 DIAGNOSIS — M48061 Spinal stenosis, lumbar region without neurogenic claudication: Secondary | ICD-10-CM | POA: Diagnosis not present

## 2021-08-01 MED ORDER — IOHEXOL 180 MG/ML  SOLN
20.0000 mL | Freq: Once | INTRAMUSCULAR | Status: AC | PRN
  Administered 2021-08-01: 12 mL via INTRATHECAL

## 2021-08-01 MED ORDER — LIDOCAINE HCL (PF) 1 % IJ SOLN
5.0000 mL | Freq: Once | INTRAMUSCULAR | Status: AC
  Administered 2021-08-01: 5 mL via INTRADERMAL

## 2021-08-01 MED ORDER — HYDROCODONE-ACETAMINOPHEN 5-325 MG PO TABS
ORAL_TABLET | ORAL | Status: AC
  Filled 2021-08-01: qty 1

## 2021-08-01 MED ORDER — DIAZEPAM 5 MG PO TABS
10.0000 mg | ORAL_TABLET | Freq: Once | ORAL | Status: AC
  Administered 2021-08-01: 10 mg via ORAL

## 2021-08-01 MED ORDER — DIAZEPAM 5 MG PO TABS
ORAL_TABLET | ORAL | Status: AC
  Filled 2021-08-01: qty 2

## 2021-08-01 MED ORDER — ONDANSETRON HCL 4 MG/2ML IJ SOLN: 4.0000 mg | Freq: Four times a day (QID) | INTRAMUSCULAR | Status: DC | PRN

## 2021-08-01 MED ORDER — HYDROCODONE-ACETAMINOPHEN 5-325 MG PO TABS
1.0000 | ORAL_TABLET | ORAL | Status: DC | PRN
  Administered 2021-08-01: 1 via ORAL

## 2021-08-01 NOTE — Progress Notes (Signed)
Pt ambulated without difficulty or bleeding.   Discharged home with his daughter who will drive and stay with pt x 24 hrs.

## 2021-08-01 NOTE — Procedures (Signed)
Mr. Matheo Rathbone is an 85 year old individual whose had significant progressive problems with back pain and leg weakness.  He notes that he can walk about 50 feet before he gets substantial pain in his legs and the back pain becomes overwhelming he notes that standing straight upright is also becoming increasingly impossible for him he has been evaluated previously with some films and he notes that he has a degenerative scoliosis and an MRI was performed years ago which demonstrates that he had some moderate degrees of stenosis.  He has been advised regarding myelography and a post myelogram CAT scan to better evaluate this process.  Pre op Dx: Lumbar scoliosis, lumbar stenosis with neurogenic claudication Post op Dx: Same Procedure: Lumbar myelogram with flexion-extension films Surgeon: Aydrien Froman Puncture level: L2-3 Fluid color: Clear colorless Injection: Isovue 180, 12 mL Findings: Severe stenosis in the upper lumbar spinal region with essentially a myelographic block.  Further evaluation with CT scanning.

## 2021-08-01 NOTE — Discharge Instructions (Signed)
Myelogram and Lumbar Puncture Discharge Instructions  Go home and rest quietly for the next 24 hours.  It is important to lie flat for the next 24 hours.  Get up only to go to the restroom.  You may lie in the bed or on a couch on your back, your stomach, your left side or your right side.  You may have one pillow under your head.  You may have pillows between your knees while you are on your side or under your knees while you are on your back.  DO NOT drive today.  Recline the seat as far back as it will go, while still wearing your seat belt, on the way home.  You may get up to go to the bathroom as needed.  You may sit up for 10 minutes to eat.  You may resume your normal diet and medications unless otherwise indicated.  The incidence of headache, nausea, or vomiting is about 5% (one in 20 patients).  If you develop a headache, lie flat and drink plenty of fluids until the headache goes away.  Caffeinated beverages may be helpful.  If you develop severe nausea and vomiting or a headache that does not go away with flat bed rest, call Dr. Clarice Pole office.  You may resume normal activities after your 24 hours of bed rest is over; however, do not exert yourself strongly or do any heavy lifting tomorrow.  Call your physician for a follow-up appointment.  The results of your myelogram will be sent directly to your physician by the following day.  If you have any questions or if complications develop after you arrive home, please call Dr. Clarice Pole office.  Discharge instructions have been explained to the patient.  The patient, or the person responsible for the patient, fully understands these instructions.

## 2021-08-03 DIAGNOSIS — M48062 Spinal stenosis, lumbar region with neurogenic claudication: Secondary | ICD-10-CM | POA: Diagnosis not present

## 2021-08-08 ENCOUNTER — Other Ambulatory Visit: Payer: Self-pay | Admitting: Family Medicine

## 2021-08-08 DIAGNOSIS — E7800 Pure hypercholesterolemia, unspecified: Secondary | ICD-10-CM

## 2021-08-09 ENCOUNTER — Other Ambulatory Visit: Payer: Self-pay | Admitting: Neurological Surgery

## 2021-08-15 DIAGNOSIS — Z87442 Personal history of urinary calculi: Secondary | ICD-10-CM | POA: Diagnosis not present

## 2021-08-18 DIAGNOSIS — I129 Hypertensive chronic kidney disease with stage 1 through stage 4 chronic kidney disease, or unspecified chronic kidney disease: Secondary | ICD-10-CM | POA: Diagnosis not present

## 2021-08-18 DIAGNOSIS — R609 Edema, unspecified: Secondary | ICD-10-CM | POA: Diagnosis not present

## 2021-08-18 DIAGNOSIS — N184 Chronic kidney disease, stage 4 (severe): Secondary | ICD-10-CM | POA: Diagnosis not present

## 2021-08-18 DIAGNOSIS — R809 Proteinuria, unspecified: Secondary | ICD-10-CM | POA: Diagnosis not present

## 2021-08-18 DIAGNOSIS — N200 Calculus of kidney: Secondary | ICD-10-CM | POA: Diagnosis not present

## 2021-08-22 ENCOUNTER — Encounter (HOSPITAL_COMMUNITY): Payer: Self-pay

## 2021-08-22 NOTE — Anesthesia Preprocedure Evaluation (Addendum)
Anesthesia Evaluation  Patient identified by MRN, date of birth, ID band Patient awake    Reviewed: Allergy & Precautions, NPO status , Patient's Chart, lab work & pertinent test results  History of Anesthesia Complications Negative for: history of anesthetic complications  Airway Mallampati: III  TM Distance: >3 FB Neck ROM: Limited    Dental  (+) Edentulous Upper, Edentulous Lower   Pulmonary former smoker,    Pulmonary exam normal        Cardiovascular hypertension, Pt. on medications Normal cardiovascular exam   '22 TTE - EF 60 to 65%. Mild left ventricular hypertrophy. Grade I diastolic dysfunction (impaired relaxation). Elevated left atrial pressure. Left atrial size was mildly dilated.    Neuro/Psych negative neurological ROS  negative psych ROS   GI/Hepatic negative GI ROS, Neg liver ROS,   Endo/Other  Hypothyroidism  Obesity   Renal/GU CRFRenal disease     Musculoskeletal  Gout    Abdominal   Peds  Hematology negative hematology ROS (+)   Anesthesia Other Findings   Reproductive/Obstetrics                           Anesthesia Physical Anesthesia Plan  ASA: 3  Anesthesia Plan: General   Post-op Pain Management:    Induction: Intravenous  PONV Risk Score and Plan: 3 and Treatment may vary due to age or medical condition, Ondansetron and Propofol infusion  Airway Management Planned: Oral ETT  Additional Equipment: None  Intra-op Plan:   Post-operative Plan: Extubation in OR  Informed Consent: I have reviewed the patients History and Physical, chart, labs and discussed the procedure including the risks, benefits and alternatives for the proposed anesthesia with the patient or authorized representative who has indicated his/her understanding and acceptance.     Dental advisory given  Plan Discussed with: CRNA and Anesthesiologist  Anesthesia Plan Comments:  (Glidescope available if needed)      Anesthesia Quick Evaluation

## 2021-08-22 NOTE — Progress Notes (Signed)
PCP - Erby Pian Cardiologist - denies EKG - 01/24/21 Chest x-ray - 01/24/21 ECHO - 04/19/21 Cardiac Cath - denies CPAP - denies  DM - denies  Aspirin Instructions: last dose on August 25th  ERAS Protcol - yes, able to drink until 6:30 AM  COVID TEST- DOS  Anesthesia review: yes, received cardiac & nephrology clearance for surgery  -------------  SDW INSTRUCTIONS:  Your procedure is scheduled on Tuesday August 30th. Please report to [facility] Main Entrance "A" at 5:30 A.M., and check in at the Admitting office. Call this number if you have problems the morning of surgery: [phone]   Remember: Do not eat after midnight the night before your surgery  You may drink clear liquids until 6:30 AM the morning of your surgery.   Clear liquids allowed are: Water, Non-Citrus Juices (without pulp), Carbonated Beverages, Clear Tea, Black Coffee Only, and Gatorade   Medications to take morning of surgery with a sip of water include: Tylenol - if needed Allopurinol (Zyloprim) Amlodipine (Norvasc) Levothyroxine (Synthroid) Pravastatin (Pravachol) Nasal spray   As of today, STOP taking any Aspirin (unless otherwise instructed by your surgeon), Aleve, Naproxen, Ibuprofen, Motrin, Advil, Goody's, BC's, all herbal medications, fish oil, and all vitamins.    The Morning of Surgery Do not wear jewelry Do not wear lotions, powders, colognes, or deodorant Do not shave 48 hours prior to surgery.   Men may shave face and neck. Do not bring valuables to the hospital. [facility] is not responsible for any belongings or valuables.  If you are a smoker, DO NOT Smoke 24 hours prior to surgery  If you wear a CPAP at night please bring your mask the morning of surgery   Remember that you must have someone to transport you home after your surgery, and remain with you for 24 hours if you are discharged the same day.  Please bring cases for contacts, glasses, hearing aids, dentures or  bridgework because it cannot be worn into surgery.   Patients discharged the day of surgery will not be allowed to drive home.   Please shower the NIGHT BEFORE/MORNING OF SURGERY (use antibacterial soap like DIAL soap if possible). Wear comfortable clothes the morning of surgery. Oral Hygiene is also important to reduce your risk of infection.  Remember - BRUSH YOUR TEETH THE MORNING OF SURGERY WITH YOUR REGULAR TOOTHPASTE  Patient denies shortness of breath, fever, cough and chest pain.

## 2021-08-22 NOTE — Progress Notes (Signed)
Anesthesia Chart Review: Same day workup  Patient follows with nephrologist Dr. Johnney Ou for history of CKD 3/4.  Reviewed note from April 05, 2021.  He has had stable renal function over the past 4 to 5 years with eGFR varying between high 20s to low 20s.  He was noted to have low risk for progression of CKD to ESRD.  Most recent creatinine 1.70 on 07/26/2021.  He will need DOS labs and evaluation.  Recent echo ordered for LE swelling was essentially normal. Swelling resolved with medication management per nephrology.   EKG 01/24/21: Sinus rhythm. Rate 92.  TTE 04/19/2021:  1. Left ventricular ejection fraction, by estimation, is 60 to 65%. The  left ventricle has normal function. The left ventricle has no regional  wall motion abnormalities. There is mild left ventricular hypertrophy.  Left ventricular diastolic parameters  are consistent with Grade I diastolic dysfunction (impaired relaxation).  Elevated left atrial pressure.   2. Right ventricular systolic function is normal. The right ventricular  size is normal.   3. Left atrial size was mildly dilated.   4. The mitral valve is normal in structure. No evidence of mitral valve  regurgitation. No evidence of mitral stenosis.   5. The aortic valve has an indeterminant number of cusps. There is mild  calcification of the aortic valve. There is mild thickening of the aortic  valve. Aortic valve regurgitation is not visualized. No aortic stenosis is  present.   6. The inferior vena cava is normal in size with greater than 50%  respiratory variability, suggesting right atrial pressure of 3 mmHg.    Wynonia Musty [facility] Short Stay Center/Anesthesiology Phone [phone] 08/22/2021 4:56 PM

## 2021-08-22 NOTE — Progress Notes (Signed)
PCP - Erby Pian Cardiologist - denies Received cardiac clearance for recent heart work up but denies having a cardiologist Jannifer Hick given clearance for nephrology  EKG - 01/24/21 Chest x-ray - 01/24/21 ECHO - 04/19/21 Cardiac Cath - denies CPAP - denies  DM - denies  Aspirin Instructions: last dose on August 25th  [place] - yes, able to drink until 6:30 AM  COVID TEST- DOS  Anesthesia review: yes, received cardiac & nephrology clearance for surgery  -------------  SDW INSTRUCTIONS:  Your procedure is scheduled on Tuesday August 30th. [facility] at 5:30 A.M., and check in at the Admitting office. Call this number if you have problems the morning of surgery: [phone]   Remember: Do not eat after midnight the night before your surgery  You may drink clear liquids until 6:30 AM the morning of your surgery.   Clear liquids allowed are: Water, Non-Citrus Juices (without pulp), Carbonated Beverages, Clear Tea, Black Coffee Only, and Gatorade   Medications to take morning of surgery with a sip of water include: Tylenol - if needed Allopurinol (Zyloprim) Amlodipine (Norvasc) Levothyroxine (Synthroid) Pravastatin (Pravachol) Nasal spray   As of today, STOP taking any Aspirin (unless otherwise instructed by your surgeon), Aleve, Naproxen, Ibuprofen, Motrin, Advil, Goody's, BC's, all herbal medications, fish oil, and all vitamins.    The Morning of Surgery Do not wear jewelry Do not wear lotions, powders, colognes, or deodorant Do not shave 48 hours prior to surgery.   Men may shave face and neck. Do not bring valuables to the hospital. [facility] is not responsible for any belongings or valuables.  If you are a smoker, DO NOT Smoke 24 hours prior to surgery  If you wear a CPAP at night please bring your mask the morning of surgery   Remember that you must have someone to transport you home after your surgery, and  remain with you for 24 hours if you are discharged the same day.  Please bring cases for contacts, glasses, hearing aids, dentures or bridgework because it cannot be worn into surgery.   Patients discharged the day of surgery will not be allowed to drive home.   Please shower the NIGHT BEFORE/MORNING OF SURGERY (use antibacterial soap like DIAL soap if possible). Wear comfortable clothes the morning of surgery. Oral Hygiene is also important to reduce your risk of infection.  Remember - BRUSH YOUR TEETH THE MORNING OF SURGERY WITH YOUR REGULAR TOOTHPASTE  Patient denies shortness of breath, fever, cough and chest pain.

## 2021-08-23 ENCOUNTER — Inpatient Hospital Stay (HOSPITAL_COMMUNITY): Payer: Medicare Other | Admitting: Physician Assistant

## 2021-08-23 ENCOUNTER — Inpatient Hospital Stay (HOSPITAL_COMMUNITY): Payer: Medicare Other

## 2021-08-23 ENCOUNTER — Other Ambulatory Visit: Payer: Self-pay

## 2021-08-23 ENCOUNTER — Inpatient Hospital Stay (HOSPITAL_COMMUNITY)
Admission: RE | Disposition: A | Discharge status: Home / Self Care | Payer: Self-pay | Source: Home / Self Care | Attending: Neurological Surgery

## 2021-08-23 ENCOUNTER — Inpatient Hospital Stay (HOSPITAL_COMMUNITY)
Admission: RE | Admit: 2021-08-23 | Discharge: 2021-08-24 | DRG: 520 | Disposition: A | Discharge status: Home / Self Care | Payer: Medicare Other | Attending: Neurological Surgery | Admitting: Neurological Surgery

## 2021-08-23 ENCOUNTER — Encounter (HOSPITAL_COMMUNITY): Payer: Self-pay | Admitting: Neurological Surgery

## 2021-08-23 ENCOUNTER — Inpatient Hospital Stay (HOSPITAL_COMMUNITY)
Admission: RE | Admit: 2021-08-23 | Discharge: 2021-08-23 | Disposition: A | Discharge status: Home / Self Care | Payer: Medicare Other | Source: Ambulatory Visit

## 2021-08-23 DIAGNOSIS — N184 Chronic kidney disease, stage 4 (severe): Secondary | ICD-10-CM | POA: Diagnosis not present

## 2021-08-23 DIAGNOSIS — M419 Scoliosis, unspecified: Secondary | ICD-10-CM | POA: Diagnosis not present

## 2021-08-23 DIAGNOSIS — Z20822 Contact with and (suspected) exposure to covid-19: Secondary | ICD-10-CM | POA: Diagnosis not present

## 2021-08-23 DIAGNOSIS — E669 Obesity, unspecified: Secondary | ICD-10-CM | POA: Diagnosis present

## 2021-08-23 DIAGNOSIS — Z87891 Personal history of nicotine dependence: Secondary | ICD-10-CM

## 2021-08-23 DIAGNOSIS — Z981 Arthrodesis status: Secondary | ICD-10-CM | POA: Diagnosis not present

## 2021-08-23 DIAGNOSIS — R262 Difficulty in walking, not elsewhere classified: Secondary | ICD-10-CM | POA: Diagnosis not present

## 2021-08-23 DIAGNOSIS — Z6834 Body mass index (BMI) 34.0-34.9, adult: Secondary | ICD-10-CM | POA: Diagnosis not present

## 2021-08-23 DIAGNOSIS — M5416 Radiculopathy, lumbar region: Secondary | ICD-10-CM | POA: Diagnosis present

## 2021-08-23 DIAGNOSIS — M109 Gout, unspecified: Secondary | ICD-10-CM | POA: Diagnosis present

## 2021-08-23 DIAGNOSIS — E039 Hypothyroidism, unspecified: Secondary | ICD-10-CM | POA: Diagnosis present

## 2021-08-23 DIAGNOSIS — M4186 Other forms of scoliosis, lumbar region: Secondary | ICD-10-CM | POA: Diagnosis not present

## 2021-08-23 DIAGNOSIS — M48062 Spinal stenosis, lumbar region with neurogenic claudication: Principal | ICD-10-CM | POA: Diagnosis present

## 2021-08-23 DIAGNOSIS — I129 Hypertensive chronic kidney disease with stage 1 through stage 4 chronic kidney disease, or unspecified chronic kidney disease: Secondary | ICD-10-CM | POA: Diagnosis not present

## 2021-08-23 DIAGNOSIS — Z419 Encounter for procedure for purposes other than remedying health state, unspecified: Secondary | ICD-10-CM

## 2021-08-23 HISTORY — PX: LUMBAR LAMINECTOMY/DECOMPRESSION MICRODISCECTOMY: SHX5026

## 2021-08-23 HISTORY — DX: Pneumonia, unspecified organism: J18.9

## 2021-08-23 LAB — CBC
HCT: 40.2 % (ref 39.0–52.0)
Hemoglobin: 12.9 g/dL — ABNORMAL LOW (ref 13.0–17.0)
MCH: 30.1 pg (ref 26.0–34.0)
MCHC: 32.1 g/dL (ref 30.0–36.0)
MCV: 93.7 fL (ref 80.0–100.0)
Platelets: 285 10*3/uL (ref 150–400)
RBC: 4.29 MIL/uL (ref 4.22–5.81)
RDW: 13.3 % (ref 11.5–15.5)
WBC: 7.3 10*3/uL (ref 4.0–10.5)
nRBC: 0 % (ref 0.0–0.2)

## 2021-08-23 LAB — BASIC METABOLIC PANEL
Anion gap: 9 (ref 5–15)
BUN: 27 mg/dL — ABNORMAL HIGH (ref 8–23)
CO2: 21 mmol/L — ABNORMAL LOW (ref 22–32)
Calcium: 9.5 mg/dL (ref 8.9–10.3)
Chloride: 111 mmol/L (ref 98–111)
Creatinine, Ser: 1.75 mg/dL — ABNORMAL HIGH (ref 0.61–1.24)
GFR, Estimated: 38 mL/min — ABNORMAL LOW (ref >60)
Glucose, Bld: 105 mg/dL — ABNORMAL HIGH (ref 70–99)
Potassium: 3.7 mmol/L (ref 3.5–5.1)
Sodium: 141 mmol/L (ref 135–145)

## 2021-08-23 LAB — SARS CORONAVIRUS 2 BY RT PCR (HOSPITAL ORDER, PERFORMED IN ~~LOC~~ HOSPITAL LAB): SARS Coronavirus 2: NEGATIVE

## 2021-08-23 LAB — SURGICAL PCR SCREEN
MRSA, PCR: NEGATIVE
Staphylococcus aureus: NEGATIVE

## 2021-08-23 SURGERY — LUMBAR LAMINECTOMY/DECOMPRESSION MICRODISCECTOMY 4 LEVEL
Anesthesia: General | Site: Back

## 2021-08-23 MED ORDER — ARTIFICIAL TEARS OPHTHALMIC OINT
TOPICAL_OINTMENT | OPHTHALMIC | Status: AC
  Filled 2021-08-23: qty 3.5

## 2021-08-23 MED ORDER — PHENOL 1.4 % MT LIQD: 1.0000 | OROMUCOSAL | Status: DC | PRN

## 2021-08-23 MED ORDER — ONDANSETRON HCL 4 MG/2ML IJ SOLN
INTRAMUSCULAR | Status: DC | PRN
  Administered 2021-08-23: 4 mg via INTRAVENOUS

## 2021-08-23 MED ORDER — CEFAZOLIN SODIUM-DEXTROSE 2-4 GM/100ML-% IV SOLN
2.0000 g | Freq: Three times a day (TID) | INTRAVENOUS | Status: AC
  Administered 2021-08-23 (×2): 2 g via INTRAVENOUS
  Filled 2021-08-23 (×2): qty 100

## 2021-08-23 MED ORDER — ORAL CARE MOUTH RINSE: 15.0000 mL | Freq: Once | OROMUCOSAL | Status: AC

## 2021-08-23 MED ORDER — HYDROCHLOROTHIAZIDE 25 MG PO TABS
25.0000 mg | ORAL_TABLET | Freq: Every day | ORAL | Status: DC
  Administered 2021-08-23 – 2021-08-24 (×2): 25 mg via ORAL
  Filled 2021-08-23 (×2): qty 1

## 2021-08-23 MED ORDER — ROCURONIUM BROMIDE 10 MG/ML (PF) SYRINGE
PREFILLED_SYRINGE | INTRAVENOUS | Status: AC
  Filled 2021-08-23: qty 10

## 2021-08-23 MED ORDER — GLYCOPYRROLATE PF 0.2 MG/ML IJ SOSY
PREFILLED_SYRINGE | INTRAMUSCULAR | Status: AC
  Filled 2021-08-23: qty 1

## 2021-08-23 MED ORDER — METHOCARBAMOL 500 MG PO TABS
500.0000 mg | ORAL_TABLET | Freq: Four times a day (QID) | ORAL | Status: DC | PRN
  Administered 2021-08-23 – 2021-08-24 (×2): 500 mg via ORAL
  Filled 2021-08-23 (×2): qty 1

## 2021-08-23 MED ORDER — SALINE SPRAY 0.65 % NA SOLN
1.0000 | Freq: Every day | NASAL | Status: DC
  Filled 2021-08-23: qty 44

## 2021-08-23 MED ORDER — PRAVASTATIN SODIUM 40 MG PO TABS
80.0000 mg | ORAL_TABLET | Freq: Every day | ORAL | Status: DC
  Administered 2021-08-23 – 2021-08-24 (×2): 80 mg via ORAL
  Filled 2021-08-23 (×2): qty 2

## 2021-08-23 MED ORDER — FENOFIBRATE 160 MG PO TABS
160.0000 mg | ORAL_TABLET | Freq: Every day | ORAL | Status: DC
  Administered 2021-08-23 – 2021-08-24 (×2): 160 mg via ORAL
  Filled 2021-08-23 (×2): qty 1

## 2021-08-23 MED ORDER — SODIUM CHLORIDE 0.9% FLUSH: 3.0000 mL | INTRAVENOUS | Status: DC | PRN

## 2021-08-23 MED ORDER — THROMBIN 5000 UNITS EX SOLR
OROMUCOSAL | Status: DC | PRN
  Administered 2021-08-23 (×2): 5 mL via TOPICAL

## 2021-08-23 MED ORDER — SUGAMMADEX SODIUM 200 MG/2ML IV SOLN
INTRAVENOUS | Status: DC | PRN
  Administered 2021-08-23: 200 mg via INTRAVENOUS

## 2021-08-23 MED ORDER — LIDOCAINE-EPINEPHRINE 1 %-1:100000 IJ SOLN
INTRAMUSCULAR | Status: AC
  Filled 2021-08-23: qty 1

## 2021-08-23 MED ORDER — PHENYLEPHRINE 40 MCG/ML (10ML) SYRINGE FOR IV PUSH (FOR BLOOD PRESSURE SUPPORT)
PREFILLED_SYRINGE | INTRAVENOUS | Status: DC | PRN
  Administered 2021-08-23: 80 ug via INTRAVENOUS
  Administered 2021-08-23: 160 ug via INTRAVENOUS

## 2021-08-23 MED ORDER — PROPOFOL 10 MG/ML IV BOLUS
INTRAVENOUS | Status: DC | PRN
  Administered 2021-08-23: 100 mg via INTRAVENOUS

## 2021-08-23 MED ORDER — HYDROMORPHONE HCL 1 MG/ML IJ SOLN
INTRAMUSCULAR | Status: AC
  Filled 2021-08-23: qty 0.5

## 2021-08-23 MED ORDER — LIDOCAINE-EPINEPHRINE 1 %-1:100000 IJ SOLN
INTRAMUSCULAR | Status: DC | PRN
  Administered 2021-08-23: 5 mL

## 2021-08-23 MED ORDER — SENNA 8.6 MG PO TABS
1.0000 | ORAL_TABLET | Freq: Two times a day (BID) | ORAL | Status: DC
  Administered 2021-08-23 – 2021-08-24 (×2): 8.6 mg via ORAL
  Filled 2021-08-23 (×2): qty 1

## 2021-08-23 MED ORDER — FENTANYL CITRATE (PF) 100 MCG/2ML IJ SOLN: 25.0000 ug | INTRAMUSCULAR | Status: DC | PRN

## 2021-08-23 MED ORDER — LIDOCAINE 2% (20 MG/ML) 5 ML SYRINGE
INTRAMUSCULAR | Status: AC
  Filled 2021-08-23: qty 5

## 2021-08-23 MED ORDER — ACETAMINOPHEN 10 MG/ML IV SOLN
INTRAVENOUS | Status: AC
  Filled 2021-08-23: qty 100

## 2021-08-23 MED ORDER — BISACODYL 10 MG RE SUPP: 10.0000 mg | Freq: Every day | RECTAL | Status: DC | PRN

## 2021-08-23 MED ORDER — VANCOMYCIN HCL IN DEXTROSE 1-5 GM/200ML-% IV SOLN
INTRAVENOUS | Status: AC
  Administered 2021-08-23: 1000 mg via INTRAVENOUS
  Filled 2021-08-23: qty 200

## 2021-08-23 MED ORDER — ACETAMINOPHEN 650 MG RE SUPP: 650.0000 mg | RECTAL | Status: DC | PRN

## 2021-08-23 MED ORDER — ONDANSETRON HCL 4 MG/2ML IJ SOLN
INTRAMUSCULAR | Status: AC
  Filled 2021-08-23: qty 2

## 2021-08-23 MED ORDER — KETAMINE HCL 10 MG/ML IJ SOLN
INTRAMUSCULAR | Status: DC | PRN
  Administered 2021-08-23: 30 mg via INTRAVENOUS
  Administered 2021-08-23: 10 mg via INTRAVENOUS

## 2021-08-23 MED ORDER — OXYCODONE HCL 5 MG PO TABS: 5.0000 mg | ORAL_TABLET | Freq: Once | ORAL | Status: DC | PRN

## 2021-08-23 MED ORDER — THROMBIN 5000 UNITS EX SOLR
CUTANEOUS | Status: AC
  Filled 2021-08-23: qty 5000

## 2021-08-23 MED ORDER — ROCURONIUM BROMIDE 10 MG/ML (PF) SYRINGE
PREFILLED_SYRINGE | INTRAVENOUS | Status: DC | PRN
  Administered 2021-08-23: 60 mg via INTRAVENOUS

## 2021-08-23 MED ORDER — LACTATED RINGERS IV SOLN: INTRAVENOUS | Status: DC | PRN

## 2021-08-23 MED ORDER — LACTATED RINGERS IV SOLN: INTRAVENOUS | Status: DC

## 2021-08-23 MED ORDER — FENTANYL CITRATE (PF) 250 MCG/5ML IJ SOLN
INTRAMUSCULAR | Status: AC
  Filled 2021-08-23: qty 5

## 2021-08-23 MED ORDER — TIZANIDINE HCL 4 MG PO TABS: 2.0000 mg | ORAL_TABLET | Freq: Every evening | ORAL | Status: DC | PRN

## 2021-08-23 MED ORDER — DEXAMETHASONE SODIUM PHOSPHATE 10 MG/ML IJ SOLN
INTRAMUSCULAR | Status: DC | PRN
  Administered 2021-08-23: 10 mg via INTRAVENOUS

## 2021-08-23 MED ORDER — POLYETHYLENE GLYCOL 3350 17 G PO PACK: 17.0000 g | PACK | Freq: Every day | ORAL | Status: DC | PRN

## 2021-08-23 MED ORDER — DEXAMETHASONE SODIUM PHOSPHATE 10 MG/ML IJ SOLN
INTRAMUSCULAR | Status: AC
  Filled 2021-08-23: qty 1

## 2021-08-23 MED ORDER — KETAMINE HCL 50 MG/5ML IJ SOSY
PREFILLED_SYRINGE | INTRAMUSCULAR | Status: AC
  Filled 2021-08-23: qty 5

## 2021-08-23 MED ORDER — BUPIVACAINE HCL (PF) 0.5 % IJ SOLN
INTRAMUSCULAR | Status: DC | PRN
  Administered 2021-08-23: 15 mL
  Administered 2021-08-23: 5 mL

## 2021-08-23 MED ORDER — SODIUM CHLORIDE 0.9 % IV SOLN: 250.0000 mL | INTRAVENOUS | Status: DC

## 2021-08-23 MED ORDER — OXYCODONE HCL 5 MG/5ML PO SOLN: 5.0000 mg | Freq: Once | ORAL | Status: DC | PRN

## 2021-08-23 MED ORDER — IRBESARTAN 150 MG PO TABS
300.0000 mg | ORAL_TABLET | Freq: Every day | ORAL | Status: DC
  Administered 2021-08-23 – 2021-08-24 (×2): 300 mg via ORAL
  Filled 2021-08-23 (×2): qty 2

## 2021-08-23 MED ORDER — LIDOCAINE 2% (20 MG/ML) 5 ML SYRINGE
INTRAMUSCULAR | Status: DC | PRN
  Administered 2021-08-23: 60 mg via INTRAVENOUS

## 2021-08-23 MED ORDER — ONDANSETRON HCL 4 MG PO TABS: 4.0000 mg | ORAL_TABLET | Freq: Four times a day (QID) | ORAL | Status: DC | PRN

## 2021-08-23 MED ORDER — PHENYLEPHRINE HCL-NACL 20-0.9 MG/250ML-% IV SOLN
INTRAVENOUS | Status: DC | PRN
  Administered 2021-08-23: 50 ug/min via INTRAVENOUS

## 2021-08-23 MED ORDER — ONDANSETRON HCL 4 MG/2ML IJ SOLN: 4.0000 mg | Freq: Four times a day (QID) | INTRAMUSCULAR | Status: DC | PRN

## 2021-08-23 MED ORDER — FLEET ENEMA 7-19 GM/118ML RE ENEM: 1.0000 | ENEMA | Freq: Once | RECTAL | Status: DC | PRN

## 2021-08-23 MED ORDER — DOCUSATE SODIUM 100 MG PO CAPS
100.0000 mg | ORAL_CAPSULE | Freq: Two times a day (BID) | ORAL | Status: DC
  Administered 2021-08-23 – 2021-08-24 (×2): 100 mg via ORAL
  Filled 2021-08-23 (×2): qty 1

## 2021-08-23 MED ORDER — SODIUM CHLORIDE 0.9% FLUSH: 3.0000 mL | Freq: Two times a day (BID) | INTRAVENOUS | Status: DC

## 2021-08-23 MED ORDER — HYDROMORPHONE HCL 1 MG/ML IJ SOLN
INTRAMUSCULAR | Status: DC | PRN
  Administered 2021-08-23: .5 mg via INTRAVENOUS

## 2021-08-23 MED ORDER — FENTANYL CITRATE (PF) 100 MCG/2ML IJ SOLN
INTRAMUSCULAR | Status: DC | PRN
  Administered 2021-08-23: 50 ug via INTRAVENOUS
  Administered 2021-08-23: 100 ug via INTRAVENOUS
  Administered 2021-08-23 (×2): 50 ug via INTRAVENOUS

## 2021-08-23 MED ORDER — ONDANSETRON HCL 4 MG/2ML IJ SOLN: 4.0000 mg | Freq: Once | INTRAMUSCULAR | Status: DC | PRN

## 2021-08-23 MED ORDER — BUPIVACAINE HCL (PF) 0.5 % IJ SOLN
INTRAMUSCULAR | Status: AC
  Filled 2021-08-23: qty 30

## 2021-08-23 MED ORDER — ALLOPURINOL 100 MG PO TABS
100.0000 mg | ORAL_TABLET | Freq: Every day | ORAL | Status: DC
  Administered 2021-08-24: 100 mg via ORAL
  Filled 2021-08-23: qty 1

## 2021-08-23 MED ORDER — CHLORHEXIDINE GLUCONATE CLOTH 2 % EX PADS: 6.0000 | MEDICATED_PAD | Freq: Once | CUTANEOUS | Status: DC

## 2021-08-23 MED ORDER — ACETAMINOPHEN 10 MG/ML IV SOLN
INTRAVENOUS | Status: DC | PRN
  Administered 2021-08-23: 1000 mg via INTRAVENOUS

## 2021-08-23 MED ORDER — LEVOTHYROXINE SODIUM 112 MCG PO TABS
112.0000 ug | ORAL_TABLET | Freq: Every day | ORAL | Status: DC
  Administered 2021-08-24: 112 ug via ORAL
  Filled 2021-08-23 (×2): qty 1

## 2021-08-23 MED ORDER — MORPHINE SULFATE (PF) 2 MG/ML IV SOLN: 2.0000 mg | INTRAVENOUS | Status: DC | PRN

## 2021-08-23 MED ORDER — 0.9 % SODIUM CHLORIDE (POUR BTL) OPTIME
TOPICAL | Status: DC | PRN
  Administered 2021-08-23: 1000 mL

## 2021-08-23 MED ORDER — MENTHOL 3 MG MT LOZG: 1.0000 | LOZENGE | OROMUCOSAL | Status: DC | PRN

## 2021-08-23 MED ORDER — CHLORHEXIDINE GLUCONATE 0.12 % MT SOLN
15.0000 mL | Freq: Once | OROMUCOSAL | Status: AC
  Administered 2021-08-23: 15 mL via OROMUCOSAL
  Filled 2021-08-23: qty 15

## 2021-08-23 MED ORDER — AMLODIPINE BESYLATE 5 MG PO TABS
7.5000 mg | ORAL_TABLET | Freq: Every day | ORAL | Status: DC
  Administered 2021-08-24: 7.5 mg via ORAL
  Filled 2021-08-23: qty 2

## 2021-08-23 MED ORDER — GLYCOPYRROLATE PF 0.2 MG/ML IJ SOSY
PREFILLED_SYRINGE | INTRAMUSCULAR | Status: DC | PRN
  Administered 2021-08-23: .2 mg via INTRAVENOUS

## 2021-08-23 MED ORDER — OXYCODONE-ACETAMINOPHEN 5-325 MG PO TABS
1.0000 | ORAL_TABLET | ORAL | Status: DC | PRN
Start: 2021-08-23 — End: 2021-08-24
  Administered 2021-08-24: 1 via ORAL
  Filled 2021-08-23: qty 1

## 2021-08-23 MED ORDER — METHOCARBAMOL 1000 MG/10ML IJ SOLN
500.0000 mg | Freq: Four times a day (QID) | INTRAVENOUS | Status: DC | PRN
  Filled 2021-08-23: qty 5

## 2021-08-23 MED ORDER — ACETAMINOPHEN 325 MG PO TABS
650.0000 mg | ORAL_TABLET | ORAL | Status: DC | PRN
  Administered 2021-08-23 – 2021-08-24 (×2): 650 mg via ORAL
  Filled 2021-08-23 (×2): qty 2

## 2021-08-23 MED ORDER — VANCOMYCIN HCL IN DEXTROSE 1-5 GM/200ML-% IV SOLN: 1000.0000 mg | INTRAVENOUS | Status: AC

## 2021-08-23 MED ORDER — PHENYLEPHRINE 40 MCG/ML (10ML) SYRINGE FOR IV PUSH (FOR BLOOD PRESSURE SUPPORT)
PREFILLED_SYRINGE | INTRAVENOUS | Status: AC
  Filled 2021-08-23: qty 10

## 2021-08-23 SURGICAL SUPPLY — 44 items
BAG COUNTER SPONGE SURGICOUNT (BAG) ×2 IMPLANT
BAND RUBBER #18 3X1/16 STRL (MISCELLANEOUS) IMPLANT
BLADE CLIPPER SURG (BLADE) IMPLANT
BUR ACORN 6.0 (BURR) IMPLANT
BUR MATCHSTICK NEURO 3.0 LAGG (BURR) ×2 IMPLANT
CANISTER SUCT 3000ML PPV (MISCELLANEOUS) ×2 IMPLANT
DECANTER SPIKE VIAL GLASS SM (MISCELLANEOUS) IMPLANT
DERMABOND ADVANCED (GAUZE/BANDAGES/DRESSINGS) ×1
DERMABOND ADVANCED .7 DNX12 (GAUZE/BANDAGES/DRESSINGS) ×1 IMPLANT
DEVICE DISSECT PLASMABLAD 3.0S (MISCELLANEOUS) ×1 IMPLANT
DRAPE HALF SHEET 40X57 (DRAPES) IMPLANT
DRAPE LAPAROTOMY 100X72X124 (DRAPES) ×2 IMPLANT
DRAPE MICROSCOPE LEICA (MISCELLANEOUS) IMPLANT
DURAPREP 26ML APPLICATOR (WOUND CARE) ×2 IMPLANT
ELECT REM PT RETURN 9FT ADLT (ELECTROSURGICAL) ×2
ELECTRODE REM PT RTRN 9FT ADLT (ELECTROSURGICAL) ×1 IMPLANT
GAUZE 4X4 16PLY ~~LOC~~+RFID DBL (SPONGE) IMPLANT
GAUZE SPONGE 4X4 12PLY STRL (GAUZE/BANDAGES/DRESSINGS) ×2 IMPLANT
GLOVE SURG LTX SZ8.5 (GLOVE) ×2 IMPLANT
GLOVE SURG UNDER POLY LF SZ8.5 (GLOVE) ×2 IMPLANT
GOWN STRL REUS W/ TWL LRG LVL3 (GOWN DISPOSABLE) IMPLANT
GOWN STRL REUS W/ TWL XL LVL3 (GOWN DISPOSABLE) IMPLANT
GOWN STRL REUS W/TWL 2XL LVL3 (GOWN DISPOSABLE) ×2 IMPLANT
GOWN STRL REUS W/TWL LRG LVL3 (GOWN DISPOSABLE)
GOWN STRL REUS W/TWL XL LVL3 (GOWN DISPOSABLE)
HEMOSTAT POWDER KIT SURGIFOAM (HEMOSTASIS) ×4 IMPLANT
KIT BASIN OR (CUSTOM PROCEDURE TRAY) ×2 IMPLANT
KIT TURNOVER KIT B (KITS) ×2 IMPLANT
NEEDLE HYPO 22GX1.5 SAFETY (NEEDLE) ×2 IMPLANT
NEEDLE SPNL 20GX3.5 QUINCKE YW (NEEDLE) ×2 IMPLANT
NS IRRIG 1000ML POUR BTL (IV SOLUTION) ×2 IMPLANT
PACK LAMINECTOMY NEURO (CUSTOM PROCEDURE TRAY) ×2 IMPLANT
PAD ARMBOARD 7.5X6 YLW CONV (MISCELLANEOUS) ×6 IMPLANT
PATTIES SURGICAL .5 X1 (DISPOSABLE) ×2 IMPLANT
PLASMABLADE 3.0S (MISCELLANEOUS) ×2
SPONGE SURGIFOAM ABS GEL SZ50 (HEMOSTASIS) ×2 IMPLANT
SUT VIC AB 1 CT1 18XBRD ANBCTR (SUTURE) ×1 IMPLANT
SUT VIC AB 1 CT1 8-18 (SUTURE) ×2
SUT VIC AB 2-0 CP2 18 (SUTURE) ×4 IMPLANT
SUT VIC AB 3-0 SH 8-18 (SUTURE) ×4 IMPLANT
SUT VIC AB 4-0 RB1 18 (SUTURE) ×2 IMPLANT
TOWEL GREEN STERILE (TOWEL DISPOSABLE) ×2 IMPLANT
TOWEL GREEN STERILE FF (TOWEL DISPOSABLE) ×2 IMPLANT
WATER STERILE IRR 1000ML POUR (IV SOLUTION) ×2 IMPLANT

## 2021-08-23 NOTE — Progress Notes (Signed)
Orthopedic Tech Progress Note Patient Details:  Keith Wiley 09/14/1936 [id number]  Ortho Devices Type of Ortho Device: Lumbar corsett Ortho Device/Splint Location: BACK Ortho Device/Splint Interventions: Ordered, Application, Adjustment   Post Interventions Patient Tolerated: Well Instructions Provided: Care of [place] 08/23/2021, 4:29 PM

## 2021-08-23 NOTE — H&P (Signed)
Date of admission: 08/23/2021 Chief complaint: Inability to walk back pain leg pain History of present illness: Keith Wiley is an 85 year old individual who has had aggressive difficulty with increasing back pain and stiffness and weakness in his lower extremities.  He notes that he used to be able to walk miles on an in a given day and now he has difficulty walking to get the mail.  Patient notes that he is having considerable stiffness in his back and his legs and over the years he has seen Dr. Joya Wiley in the past who recommended a laminectomy from L1-L5.  He has had some epidural steroid injections which worked well for period of time but here recently he has noted that his ability to walk any distance has diminished severely.  Recently a myelogram was completed which demonstrates that the patient has high-grade stenosis at L1-L2 3 L3-4 L4-5 and L5-S1 he has evidence of severe central stenosis at the L2 334 and 4 5 levels he has marked facet hypertrophy he also has a degenerative scoliosis that is forming.  After careful consideration of his options we discussed doing a simple decompression to improve his overall ability to walk as opposed to a more significant operation to decompress and stabilize his scoliosis.  He is now admitted to undergo a multilevel laminectomy.  Past medical history reveals that patient's general health has been good.  He has had some fluid retention in his lower extremities which has been treated with diuretic.  Has no known allergies.  Systems review was negative except for the items in the history of present illness.  Physical examination reveals the patient is awake and alert oriented cranial nerve examination is within the limits of normal.  Motor function upper extremities is normal and reflexes are 1+ in the biceps and triceps the lower extremities patient describes 4 out of 5 strength in the iliopsoas quadriceps tibialis anterior and gastrocs.  He has absent reflexes in  the patellae and the Achilles both.  Babinski's are downgoing.  Sensation is diminished below the level of the knee to pin and light touch.  His gait reveals a moderately wide-based and Station is intact Romberg's test is negative. Straight leg raising is positive at 30 degrees in either lower extremities Patrick's maneuver is negative bilaterally.  Palpation and percussion of his lumbar spine does not reproduce any pain.  General physical exam reveals lungs are clear to auscultation heart is regular rate and rhythm abdomen is soft bowel sounds positive no masses are noted extremities reveal no cyanosis clubbing or edema impression patient has evidence of significant spondylitic stenosis with a degenerative scoliosis in the lower lumbar spine.  He is now admitted to undergo surgical decompression from L1-L5.

## 2021-08-23 NOTE — Op Note (Signed)
Date of surgery: 32,022 Preoperative diagnosis: Lumbar spinal stenosis, degenerative scoliosis, neurogenic claudication, lumbar radiculopathy. Postoperative diagnosis: Same Procedure: Bilateral laminotomies and decompression of the central canal and lateral recesses at L1-2 L2-3 L3-4 and L4-5.  Decompression of the L1 L2-L3-L4 and L5 nerve roots. Surgeon: Kristeen Miss First Assistant: Deri Fuelling, MD Anesthesia: General endotracheal Indications: Keith Wiley is an 85 year old individual whose had progressive back pain and leg weakness.  A myelogram recently demonstrates that he has advanced severe stenotic lesions at the L1-L2 3 L3-4 and L4-5 levels causing both central canal and lateral recess compression this is combined with a degenerative scoliosis that is approximately 18 degrees in nature.  After careful consideration of his options and discussion of surgical risks and benefits including the possibility of considering fusion surgery it has been decided to decompress the L1-L2 3 L3-4 and L4-5 levels with laminotomies and foraminotomies and not perform any arthrodesis.  Procedure: Patient was brought to the operating room supine on the stretcher.  After the smooth induction of general endotracheal anesthesia, he was carefully turned prone.  The back was prepped with alcohol DuraPrep and draped in a sterile fashion.  Then a midline incision was created in the lower lumbar spine and carried down to the lumbodorsal fascia which was opened on either side midline the lumbar vertebrae were then identified carefully using a towel hook and to pin field for his on each individual lamina to identify L3-4 and L4-5.  With these areas being isolated then started the procedure by creating laminotomies on the left side at the L3-4 and L4-5 areas.  Care was taken to remove the redundant thickened yellow ligament in the intraligamentous space posteriorly and also of the redundant thickened ligament from the facet  hypertrophy that it occurred as a result of the degenerative process.  The foramen were decompressed so that the superior nerve root was able to be palpated out into the foramen inferior traversing nerve root was identified and decompressed individually this was done at L3-4 and L4-5 on the left first then on the right side at L3-4 and L4-5 followed by the right-sided L2-3 and L1 to and then finally left side at L1-2 and L2-3 once the laminotomies and foraminotomies have been completed care was taken to inspect each of these to make sure the individual nerve roots were well decompressed.  No spinal fluid leaks occurred during this decompression.  25 cc of half percent Marcaine was injected into the paraspinous fascia around the laminotomy sites.  Then the lumbodorsal fascia was closed with #1 Vicryl in interrupted fashion 2-0 Vicryl was used in the subcutaneous tissues 3-0 Vicryl in in the subcuticular tissue along with 4-0 Vicryl and the final subcuticular closure blood loss for the procedure was estimated 450 cc.

## 2021-08-23 NOTE — Transfer of Care (Signed)
Immediate Anesthesia Transfer of Care Note  Patient: Keith Wiley  Procedure(s) Performed: Lumbar one-tow, Lumbar two-three, Lumbar three-four, Lumbar four-five, Lumbar five-Sacral one Laminectomy and foraminotomy (Back)  Patient Location: PACU  Anesthesia Type:General  Level of Consciousness: drowsy, patient cooperative and lethargic  Airway & Oxygen Therapy: Patient Spontanous Breathing and Patient connected to face mask oxygen  Post-op Assessment: Report given to RN and Post -op Vital signs reviewed and stable  Post vital signs: Reviewed and stable  Last Vitals:  Vitals Value Taken Time  BP 129/75 08/23/21 1417  Temp 36.6 C 08/23/21 1417  Pulse 78 08/23/21 1419  Resp 16 08/23/21 1419  SpO2 100 % 08/23/21 1419  Vitals shown include unvalidated device data.  Last Pain:  Vitals:   08/23/21 1417  TempSrc:   PainSc: 0-No pain      Patients Stated Pain Goal: 2 (76/15/18 3437)  Complications: No notable events documented.

## 2021-08-23 NOTE — Anesthesia Procedure Notes (Addendum)
Procedure Name: Intubation Date/Time: 08/23/2021 10:56 AM Performed by: Cathren Harsh, CRNA Pre-anesthesia Checklist: Patient identified, Emergency Drugs available, Suction available and Patient being monitored Patient Re-evaluated:Patient Re-evaluated prior to induction Oxygen Delivery Method: Circle System Utilized Preoxygenation: Pre-oxygenation with 100% oxygen Induction Type: IV induction Ventilation: Mask ventilation without difficulty Laryngoscope Size: Glidescope and 3 Grade View: Grade I Tube type: Oral Tube size: 7.5 mm Number of attempts: 1 Airway Equipment and Method: Stylet and Oral airway Placement Confirmation: ETT inserted through vocal cords under direct vision, positive ETCO2 and breath sounds checked- equal and bilateral Secured at: 23 cm Tube secured with: Tape Dental Injury: Teeth and Oropharynx as per pre-operative assessment  Comments: Small area of dry skin noted on pt right side of mouth.  Small amount of blood noted following airway instrumentation.  Pressure applied via gauze and petroleum jelly applied to affected area and dry lips.

## 2021-08-23 NOTE — Anesthesia Postprocedure Evaluation (Signed)
Anesthesia Post Note  Patient: Keith Wiley  Procedure(s) Performed: Lumbar one-tow, Lumbar two-three, Lumbar three-four, Lumbar four-five, Lumbar five-Sacral one Laminectomy and foraminotomy (Back)     Patient location during evaluation: PACU Anesthesia Type: General Level of consciousness: awake and alert Pain management: pain level controlled Vital Signs Assessment: post-procedure vital signs reviewed and stable Respiratory status: spontaneous breathing, nonlabored ventilation and respiratory function stable Cardiovascular status: stable and blood pressure returned to baseline Anesthetic complications: no   No notable events documented.  Last Vitals:  Vitals:   08/23/21 1515 08/23/21 1546  BP: 119/67 137/76  Pulse: 77 83  Resp: 15 20  Temp:    SpO2: 95% 99%    Last Pain:  Vitals:   08/23/21 1546  TempSrc:   PainSc: [place]

## 2021-08-24 ENCOUNTER — Encounter (HOSPITAL_COMMUNITY): Payer: Self-pay | Admitting: Neurological Surgery

## 2021-08-24 LAB — BASIC METABOLIC PANEL
Anion gap: 7 (ref 5–15)
BUN: 28 mg/dL — ABNORMAL HIGH (ref 8–23)
CO2: 22 mmol/L (ref 22–32)
Calcium: 8.9 mg/dL (ref 8.9–10.3)
Chloride: 108 mmol/L (ref 98–111)
Creatinine, Ser: 1.84 mg/dL — ABNORMAL HIGH (ref 0.61–1.24)
GFR, Estimated: 36 mL/min — ABNORMAL LOW (ref >60)
Glucose, Bld: 160 mg/dL — ABNORMAL HIGH (ref 70–99)
Potassium: 4.7 mmol/L (ref 3.5–5.1)
Sodium: 137 mmol/L (ref 135–145)

## 2021-08-24 LAB — CBC
HCT: 36.3 % — ABNORMAL LOW (ref 39.0–52.0)
Hemoglobin: 11.6 g/dL — ABNORMAL LOW (ref 13.0–17.0)
MCH: 30.1 pg (ref 26.0–34.0)
MCHC: 32 g/dL (ref 30.0–36.0)
MCV: 94 fL (ref 80.0–100.0)
Platelets: 267 10*3/uL (ref 150–400)
RBC: 3.86 MIL/uL — ABNORMAL LOW (ref 4.22–5.81)
RDW: 13.1 % (ref 11.5–15.5)
WBC: 14.6 10*3/uL — ABNORMAL HIGH (ref 4.0–10.5)
nRBC: 0 % (ref 0.0–0.2)

## 2021-08-24 MED ORDER — OXYCODONE-ACETAMINOPHEN 5-325 MG PO TABS: 1.0000 | ORAL_TABLET | ORAL | 0 refills | Status: DC | PRN

## 2021-08-24 MED ORDER — WHITE PETROLATUM EX OINT
TOPICAL_OINTMENT | CUTANEOUS | Status: AC
  Filled 2021-08-24: qty 28.35

## 2021-08-24 MED ORDER — METHOCARBAMOL 500 MG PO TABS: 500.0000 mg | ORAL_TABLET | Freq: Four times a day (QID) | ORAL | 3 refills | Status: DC | PRN

## 2021-08-24 MED FILL — Thrombin For Soln 5000 Unit: CUTANEOUS | Qty: 5000 | Status: AC

## 2021-08-24 NOTE — Evaluation (Signed)
Physical Therapy Evaluation Patient Details Name: Keith Wiley MRN: [id number] DOB: 11-25-1936 Today's Date: 08/24/2021   History of Present Illness  Patient is an 85 y/o male who presents s/p Bilateral laminotomies and decompression L1-L5 on 08/23/2021. PMH significant for prostate CA, CKD, gout, HTN, hypothyroidism.  Clinical Impression  Pt admitted with above diagnosis. At the time of PT eval, pt was able to demonstrate transfers and ambulation with gross min guard assist to supervision for safety without an AD. Pt reports he is near baseline of function however his LE symptoms are currently improved. Pt was educated on precautions, brace application/wearing schedule, appropriate activity progression, and car transfer. Pt currently with functional limitations due to the deficits listed below (see PT Problem List). Pt will benefit from skilled PT to increase their independence and safety with mobility to allow discharge to the venue listed below.      Follow Up Recommendations No PT follow up;Supervision - Intermittent    Equipment Recommendations  None recommended by PT    Recommendations for Other Services       Precautions / Restrictions Precautions Precautions: Back Precaution Booklet Issued: Yes (comment) Restrictions Weight Bearing Restrictions: No      Mobility  Bed Mobility               General bed mobility comments: up in chair - verbally educated patient on log roll technique.    Transfers Overall transfer level: Needs assistance Equipment used: None Transfers: Sit to/from Stand Sit to Stand: Supervision         General transfer comment: VC's for improved posture and wide BOS to power-up to full stand and control lower back down to sit.  Ambulation/Gait Ambulation/Gait assistance: Min guard Gait Distance (Feet): 200 Feet Assistive device: None Gait Pattern/deviations: Step-through pattern;Decreased stride length Gait velocity: Decreased Gait  velocity interpretation: <1.8 ft/sec, indicate of risk for recurrent falls General Gait Details: VC's for improved posture. Pt with gross unsteady appearing gait however he did not lose balance or require any assistance. Trunk flexed throughout. Pt reports this is his baseline of function.  Stairs            Wheelchair Mobility    Modified Rankin (Stroke Patients Only)       Balance Overall balance assessment: Mild deficits observed, not formally tested                                           Pertinent Vitals/Pain Pain Assessment: Faces Faces Pain Scale: Hurts a little bit Pain Location: back Pain Descriptors / Indicators: Aching Pain Intervention(s): Limited activity within patient's tolerance;Monitored during session;Repositioned    Home Living Family/patient expects to be discharged to:: Private residence Living Arrangements: Spouse/significant other;Children (grandson - 37) Available Help at Discharge: Family;Available 24 hours/day Type of Home: House Home Access: Stairs to enter Entrance Stairs-Rails: None Entrance Stairs-Number of Steps: 2 Home Layout: Laundry or work area in basement;One level Home Equipment: Cane - single point;Hand held shower head      Prior Function Level of Independence: Independent         Comments: Slow to complete ambulation     Hand Dominance   Dominant Hand: Right    Extremity/Trunk Assessment   Upper Extremity Assessment Upper Extremity Assessment: Defer to OT evaluation    Lower Extremity Assessment Lower Extremity Assessment: Generalized weakness (Consistent with pre-op diagnosis)  Cervical / Trunk Assessment Cervical / Trunk Assessment: Other exceptions Cervical / Trunk Exceptions: s/p surgery  Communication   Communication: No difficulties  Cognition Arousal/Alertness: Awake/alert Behavior During Therapy: WFL for tasks assessed/performed Overall Cognitive Status: Within Functional  Limits for tasks assessed                                        General Comments      Exercises     Assessment/Plan    PT Assessment Patient needs continued PT services  PT Problem List Decreased strength;Decreased activity tolerance;Decreased balance;Decreased mobility;Decreased knowledge of use of DME;Decreased safety awareness;Decreased knowledge of precautions;Pain       PT Treatment Interventions DME instruction;Gait training;Stair training;Functional mobility training;Therapeutic activities;Therapeutic exercise;Neuromuscular re-education;Patient/family education    PT Goals (Current goals can be found in the Care Plan section)  Acute Rehab PT Goals Patient Stated Goal: Be home with his wife and grandson PT Goal Formulation: With patient Time For Goal Achievement: 08/31/21 Potential to Achieve Goals: Good    Frequency Min 5X/week   Barriers to discharge        Co-evaluation               AM-PAC PT "6 Clicks" Mobility  Outcome Measure Help needed turning from your back to your side while in a flat bed without using bedrails?: None Help needed moving from lying on your back to sitting on the side of a flat bed without using bedrails?: A Little Help needed moving to and from a bed to a chair (including a wheelchair)?: A Little Help needed standing up from a chair using your arms (e.g., wheelchair or bedside chair)?: A Little Help needed to walk in hospital room?: A Little Help needed climbing 3-5 steps with a railing? : A Little 6 Click Score: 19    End of Session Equipment Utilized During Treatment: Gait belt;Back brace Activity Tolerance: Patient tolerated treatment well Patient left: in chair;with call bell/phone within reach Nurse Communication: Mobility status PT Visit Diagnosis: Unsteadiness on feet (R26.81);Pain Pain - part of body:  (back)    Time: 8309-4076 PT Time Calculation (min) (ACUTE ONLY): 17 min   Charges:   PT  Evaluation $PT Eval Low Complexity: 1 Low          Keith Wiley, PT, DPT Acute Rehabilitation Services Pager: [phone] Office: [phone]   Keith Wiley 08/24/2021, 10:58 AM

## 2021-08-24 NOTE — Evaluation (Signed)
Occupational Therapy Evaluation Patient Details Name: Keith Wiley MRN: [id number] DOB: Sep 19, 1936 Today's Date: 08/24/2021    History of Present Illness Patient is s/p Bilateral laminotomies and decompression of the central canal and lateral recesses at L1-2 L2-3 L3-4 and L4-5.   Clinical Impression   Mr. Keith Wiley is an 85 year old man s/p back surgery who presents with spinal precautions and pain. On evaluation patient exhibited difficulty with LB dressing and toileting. Patient educated on compensatory strategies for LB dressing and toileting. Patient demonstrated use of long handled shoe horn, reacher and sock aide to complete LB dressing. Patient educated on other compensatory strategies for home environment. Patient reports having wife and grandson at home and daughter that lives beside him. No further OT needs at this time.    Follow Up Recommendations  No OT follow up    Equipment Recommendations  None recommended by OT    Recommendations for Other Services       Precautions / Restrictions Precautions Precautions: Back Precaution Booklet Issued: Yes (comment) Restrictions Weight Bearing Restrictions: No      Mobility Bed Mobility               General bed mobility comments: up in chair - verbally educated patient on log roll technique.    Transfers Overall transfer level: Modified independent                    Balance Overall balance assessment: Mild deficits observed, not formally tested                                         ADL either performed or assessed with clinical judgement   ADL Overall ADL's : Needs assistance/impaired Eating/Feeding: Independent   Grooming: Independent   Upper Body Bathing: Independent   Lower Body Bathing: Supervison/ safety;Cueing for back precautions   Upper Body Dressing : Independent   Lower Body Dressing: Adhering to back precautions;Minimal assistance Lower Body Dressing  Details (indicate cue type and reason): able to perform with AE - after instruction. Toilet Transfer: Warehouse manager and Hygiene: Moderate assistance Toileting - Clothing Manipulation Details (indicate cue type and reason): may need assistance for perianal care             Vision Patient Visual Report: No change from baseline       Perception     Praxis      Pertinent Vitals/Pain Pain Assessment: Faces Faces Pain Scale: Hurts a little bit Pain Location: back Pain Descriptors / Indicators: Aching Pain Intervention(s): Monitored during session;Limited activity within patient's tolerance     Hand Dominance Right   Extremity/Trunk Assessment Upper Extremity Assessment Upper Extremity Assessment: Overall WFL for tasks assessed   Lower Extremity Assessment Lower Extremity Assessment: Defer to PT evaluation   Cervical / Trunk Assessment Cervical / Trunk Assessment: Normal   Communication Communication Communication: No difficulties   Cognition Arousal/Alertness: Awake/alert Behavior During Therapy: WFL for tasks assessed/performed Overall Cognitive Status: Within Functional Limits for tasks assessed                                     General Comments       Exercises     Shoulder Instructions      Home Living Family/patient expects to  be discharged to:: Private residence Living Arrangements: Spouse/significant other;Children (great grandson) Available Help at Discharge: Family;Available 24 hours/day Type of Home: House Home Access: Stairs to enter [employer] of Steps: 2   Home Layout: Laundry or work area in basement;One level     Bathroom Shower/Tub: Teacher, early years/pre: Standard Bathroom Accessibility: Yes How Accessible: Accessible via walker Home Equipment: Cane - single point;Hand held shower head          Prior Functioning/Environment Level of Independence:  Independent                 OT Problem List: Decreased activity tolerance;Decreased knowledge of use of DME or AE;Decreased knowledge of precautions;Pain      OT Treatment/Interventions:      OT Goals(Current goals can be found in the care plan section)    OT Frequency:     Barriers to D/C:            Co-evaluation              AM-PAC OT "6 Clicks" Daily Activity     Outcome Measure Help from another person eating meals?: None Help from another person taking care of personal grooming?: None Help from another person toileting, which includes using toliet, bedpan, or urinal?: A Lot Help from another person bathing (including washing, rinsing, drying)?: None Help from another person to put on and taking off regular upper body clothing?: None Help from another person to put on and taking off regular lower body clothing?: A Little 6 Click Score: 21   End of Session    Activity Tolerance: Patient tolerated treatment well Patient left: in chair;with call bell/phone within reach (in care of PT)  OT Visit Diagnosis: Pain                Time: 2482-5003 OT Time Calculation (min): 26 min Charges:  OT General Charges $OT Visit: 1 Visit OT Evaluation $OT Eval Low Complexity: 1 Low OT Treatments $Self Care/Home Management : 8-22 mins  Sheamus Hasting, OTR/L [place]  Office [phone] Pager: [place] 08/24/2021, 9:21 AM

## 2021-08-24 NOTE — Plan of Care (Signed)
Pt doing well. Pt given D/C instructions with verbal understanding. Rx's were sent to the pharmacy by MD. Pt's incision is clean and dry with no sign of infection. Pt's IV was removed prior to D/C. Pt D/C'd home via wheelchair per MD order. Pt is stable @ D/C and has no other needs at this time. Bearl Talarico, RN  

## 2021-08-24 NOTE — Discharge Summary (Signed)
Date of admission: 08/23/2021 Date of discharge: 08/24/2021 Admitting diagnosis: Lumbar spinal stenosis, degenerative scoliosis, neurogenic claudication, lumbar radiculopathy. Discharge and final diagnosis: Lumbar spinal stenosis, degenerative scoliosis, neurogenic claudication, lumbar radiculopathy. Condition on discharge: Improved Hospital course: Patient was admitted to undergo surgical decompression at L1-L2 3 L3-4 and L4-5.  He tolerated surgery well.  He has less numbness in his legs and feels his strength is improved.  He is discharged home. Discharge medications: Percocet 5/325 #30 no refills, methocarbamol 500 mg #32 refills. Follow-up: Outpatient in 2 weeks.

## 2021-08-26 ENCOUNTER — Telehealth: Payer: Self-pay

## 2021-08-26 NOTE — Telephone Encounter (Signed)
Transition Care Management Follow-up Telephone Call Date of discharge and from where: 08/24/21 How have you been since you were released from the hospital? "I been ok" Any questions or concerns? No  Items Reviewed: Did the pt receive and understand the discharge instructions provided? Yes  Medications obtained and verified? Yes  Other?  na Any new allergies since your discharge? No  Dietary orders reviewed? Yes Do you have support at home? Yes   Home Care and Equipment/Supplies: Were home health services ordered? no If so, what is the name of the agency? NA  Has the agency set up a time to come to the patient's home? not applicable Were any new equipment or medical supplies ordered?  No What is the name of the medical supply agency? NA Were you able to get the supplies/equipment? not applicable Do you have any questions related to the use of the equipment or supplies? No  Functional Questionnaire: (I = Independent and D = Dependent) ADLs: I  Bathing/Dressing- A  Meal Prep- I  Eating- I  Maintaining continence- I  Transferring/Ambulation- I  Managing Meds- I  Follow up appointments reviewed:  PCP Hospital f/u appt confirmed? Yes  Scheduled to see na on na @ na. [facility] f/u appt confirmed? Yes  Scheduled to see Dr. Ellene Route on 09/21/21 @ 10:30. Are transportation arrangements needed? No  If their condition worsens, is the pt aware to call PCP or go to the Emergency Dept.? Yes Was the patient provided with contact information for the PCP's office or ED? Yes Was to pt encouraged to call back with questions or concerns? Yes  Thea Silversmith, RN, MSN, BSN, [place] Care Management Coordinator [phone]

## 2021-09-15 ENCOUNTER — Encounter: Payer: Self-pay | Admitting: Family

## 2021-09-15 ENCOUNTER — Ambulatory Visit (INDEPENDENT_AMBULATORY_CARE_PROVIDER_SITE_OTHER): Payer: Medicare Other | Admitting: Family

## 2021-09-15 DIAGNOSIS — R059 Cough, unspecified: Secondary | ICD-10-CM

## 2021-09-15 MED ORDER — MOLNUPIRAVIR EUA 200MG CAPSULE: 4.0000 | ORAL_CAPSULE | Freq: Two times a day (BID) | ORAL | 0 refills | Status: AC

## 2021-09-15 NOTE — Addendum Note (Signed)
Addended by: Evelina Dun A on: 09/15/2021 06:24 PM   Modules accepted: Orders

## 2021-09-15 NOTE — Progress Notes (Addendum)
Virtual Visit  Note Due to COVID-19 pandemic this visit was conducted virtually. This visit type was conducted due to national recommendations for restrictions regarding the COVID-19 Pandemic (e.g. social distancing, sheltering in place) in an effort to limit this patient's exposure and mitigate transmission in our community. All issues noted in this document were discussed and addressed.  A physical exam was not performed with this format.  I connected with Keith Wiley on 09/15/21 at 4:18 pm  by telephone and verified that I am speaking with the correct person using two identifiers. Keith Wiley is currently located at home and wife is currently with him during visit. The provider, Evelina Dun, FNP is located in their office at time of visit.  I discussed the limitations, risks, security and privacy concerns of performing an evaluation and management service by telephone and the availability of in person appointments. I also discussed with the patient that there may be a patient responsible charge related to this service. The patient expressed understanding and agreed to proceed.  Keith Wiley, Keith Wiley are scheduled for a virtual visit with your provider today.    Just as we do with appointments in the office, we must obtain your consent to participate.  Your consent will be active for this visit and any virtual visit you may have with one of our providers in the next 365 days.    If you have a MyChart account, I can also send a copy of this consent to you electronically.  All virtual visits are billed to your insurance company just like a traditional visit in the office.  As this is a virtual visit, video technology does not allow for your provider to perform a traditional examination.  This may limit your provider's ability to fully assess your condition.  If your provider identifies any concerns that need to be evaluated in person or the need to arrange testing such as labs, EKG, etc, we will  make arrangements to do so.    Although advances in technology are sophisticated, we cannot ensure that it will always work on either your end or our end.  If the connection with a video visit is poor, we may have to switch to a telephone visit.  With either a video or telephone visit, we are not always able to ensure that we have a secure connection.   I need to obtain your verbal consent now.   Are you willing to proceed with your visit today?   Keith Wiley has provided verbal consent on 09/15/2021 for a virtual visit (video or telephone).   Evelina Dun, [facility] 09/15/2021  4:19 PM    History and Present Illness:  Pt calls the office today with cough, congestion, and fever of 101.7 F that started yesterday. He has not taken a COVID test.  Cough This is a new problem. The current episode started yesterday. The problem has been gradually worsening. The problem occurs every few minutes. The cough is Non-productive. Associated symptoms include chills, a fever (101.7), headaches, myalgias, nasal congestion, postnasal drip, rhinorrhea and wheezing. Pertinent negatives include no ear congestion, ear pain or shortness of breath. He has tried rest and OTC cough suppressant for the symptoms. The treatment provided no relief.     Review of Systems  Constitutional:  Positive for chills and fever (101.7).  HENT:  Positive for postnasal drip and rhinorrhea. Negative for ear pain.   Respiratory:  Positive for cough and wheezing. Negative for shortness of breath.  Musculoskeletal:  Positive for myalgias.  Neurological:  Positive for headaches.  All other systems reviewed and are negative.   Observations/Objective: Hoarse voice, nasal congestion  Assessment and Plan: 1. Cough Pt will go get COVID test to rule out and call us back.  If positive will send in molnupiravir Force fluids Rest Tylenol  Follow up if symptoms worsen or do not improve    Meds ordered this encounter  Medications    molnupiravir EUA (LAGEVRIO) 200 mg CAPS capsule    Sig: Take 4 capsules (800 mg total) by mouth 2 (two) times daily for 5 days.    Dispense:  40 capsule    Refill:  0    Order Specific Question:   Supervising Provider    Answer:   Caryl Pina A [[id number]]       I discussed the assessment and treatment plan with the patient. The patient was provided an opportunity to ask questions and all were answered. The patient agreed with the plan and demonstrated an understanding of the instructions.   The patient was advised to call back or seek an in-person evaluation if the symptoms worsen or if the condition fails to improve as anticipated.  The above assessment and management plan was discussed with the patient. The patient verbalized understanding of and has agreed to the management plan. Patient is aware to call the clinic if symptoms persist or worsen. Patient is aware when to return to the clinic for a follow-up visit. Patient educated on when it is appropriate to go to the emergency department.   Time call ended:  4:29 pm   I provided 11 minutes of  non face-to-face time during this encounter.    Evelina Dun, FNP

## 2021-10-25 DIAGNOSIS — M48062 Spinal stenosis, lumbar region with neurogenic claudication: Secondary | ICD-10-CM | POA: Diagnosis not present

## 2021-11-04 ENCOUNTER — Ambulatory Visit (INDEPENDENT_AMBULATORY_CARE_PROVIDER_SITE_OTHER): Payer: Medicare Other | Admitting: Family Medicine

## 2021-11-04 DIAGNOSIS — B9689 Other specified bacterial agents as the cause of diseases classified elsewhere: Secondary | ICD-10-CM | POA: Diagnosis not present

## 2021-11-04 DIAGNOSIS — J208 Acute bronchitis due to other specified organisms: Secondary | ICD-10-CM | POA: Diagnosis not present

## 2021-11-04 MED ORDER — PROMETHAZINE-DM 6.25-15 MG/5ML PO SYRP: 2.5000 mL | ORAL_SOLUTION | Freq: Four times a day (QID) | ORAL | 0 refills | Status: DC | PRN

## 2021-11-04 MED ORDER — AZITHROMYCIN 250 MG PO TABS
ORAL_TABLET | ORAL | 0 refills | Status: DC
Start: 2021-11-04 — End: 2022-01-26

## 2021-11-04 NOTE — Progress Notes (Signed)
Telephone visit  Subjective: CC: Flu like symptoms PCP: Janora Norlander, DO XLK:[id number] E Giambalvo is a 85 y.o. male calls for telephone consult today. Patient provides verbal consent for consult held via phone.  Due to COVID-19 pandemic this visit was conducted virtually. This visit type was conducted due to national recommendations for restrictions regarding the COVID-19 Pandemic (e.g. social distancing, sheltering in place) in an effort to limit this patient's exposure and mitigate transmission in our community. All issues noted in this document were discussed and addressed.  A physical exam was not performed with this format.   Location of patient: home Location of provider: [facility] Others present for call: none  1. Flu like symptoms He reports onset of myalgia, cough and low grade temp (99-100F) for about 1 week.  He notes that this am when he woke up he felt really sick.  He reports cough is productive and dark brown.  He took a COVID test at home and it was negative.  He has not gotten his flu shot yet.  He reports grandson is sick with cold symptoms but no other sick contacts.  He is using Mucinex, Tylenol.  He is wheezing and feels winded sometimes.  He notes the wheezing occurs when he lays down.  Nonsmoker.    ROS: Per HPI  Allergies  Allergen Reactions   Augmentin [Amoxicillin-Pot Clavulanate] Diarrhea   Past Medical History:  Diagnosis Date   Back pain    Cancer ([place])    prostrate   Chronic kidney disease    kidney stones and 1 smaller kidney, history of a kidney blockage   Gout    Hyperlipidemia    Hypertension    dr Elenore Rota moore  pcp   Hypothyroidism    Pneumonia     Current Outpatient Medications:    acetaminophen (TYLENOL) 500 MG tablet, Take 500 mg by mouth every 6 (six) hours as needed for moderate pain., Disp: , Rfl:    allopurinol (ZYLOPRIM) 100 MG tablet, Take 1 tablet (100 mg total) by mouth daily., Disp: 90 tablet, Rfl: 3   amLODipine (NORVASC) 5 MG  tablet, Take 7.5 mg by mouth daily., Disp: , Rfl:    aspirin EC 81 MG tablet, Take 81 mg by mouth daily., Disp: , Rfl:    aspirin-sod bicarb-citric acid (ALKA-SELTZER) 325 MG TBEF tablet, Take 650 mg by mouth every 6 (six) hours as needed (indigestion)., Disp: , Rfl:    Cholecalciferol (VITAMIN D) 50 MCG (2000 UT) tablet, Take 2,000 Units by mouth daily., Disp: , Rfl:    fenofibrate 160 MG tablet, Take 1 tablet (160 mg total) by mouth daily., Disp: 90 tablet, Rfl: 3   fish oil-omega-3 fatty acids 1000 MG capsule, Take 1 g by mouth 2 (two) times daily., Disp: , Rfl:    hydrochlorothiazide (HYDRODIURIL) 25 MG tablet, Take 25 mg by mouth daily., Disp: , Rfl:    hydrocortisone cream 1 %, Apply 1 application topically daily as needed for itching., Disp: , Rfl:    levothyroxine (SYNTHROID) 112 MCG tablet, Take 1 tablet (112 mcg total) by mouth daily., Disp: 90 tablet, Rfl: 2   methocarbamol (ROBAXIN) 500 MG tablet, Take 1 tablet (500 mg total) by mouth every 6 (six) hours as needed for muscle spasms., Disp: 30 tablet, Rfl: 3   oxyCODONE-acetaminophen (PERCOCET/ROXICET) 5-325 MG tablet, Take 1-2 tablets by mouth every 4 (four) hours as needed for moderate pain or severe pain., Disp: 40 tablet, Rfl: 0   pravastatin (PRAVACHOL) 80 MG  tablet, TAKE 1 TABLET BY MOUTH  DAILY, Disp: 90 tablet, Rfl: 1   sodium chloride (OCEAN) 0.65 % SOLN nasal spray, Place 1 spray into both nostrils daily., Disp: , Rfl:    tiZANidine (ZANAFLEX) 2 MG tablet, Take 1 tablet (2 mg total) by mouth at bedtime as needed for muscle spasms (back pain (renally dosed))., Disp: 10 tablet, Rfl: 0   valsartan (DIOVAN) 320 MG tablet, Take 1 tablet (320 mg total) by mouth daily., Disp: 90 tablet, Rfl: 3   vitamin C (ASCORBIC ACID) 500 MG tablet, Take 500 mg by mouth daily., Disp: , Rfl:   Assessment/ Plan: 85 y.o. male   Acute bacterial bronchitis - Plan: azithromycin (ZITHROMAX) 250 MG tablet, promethazine-dextromethorphan (PROMETHAZINE-DM)  6.25-15 MG/5ML syrup  His symptoms almost sound flulike but given longevity of symptoms I do not think that testing for flu nor treating for influenza would be useful at this point.  I am going to empirically treat for bacterial infection.  Cannot rule out pneumonia.  Z-Pak sent.  No renal dosing needed.  Promethazine with dextromethorphan as needed cough.  Encourage p.o. hydration.  We discussed red flag signs and symptoms warranting further evaluation and he voiced good understanding.  Start time: 1:18pm End time: 1:24pm  Total time spent on patient care (including telephone call/ virtual visit): 6 minutes  [place], [place] [phone]

## 2021-11-11 ENCOUNTER — Other Ambulatory Visit: Payer: Self-pay | Admitting: Family Medicine

## 2021-11-11 DIAGNOSIS — N184 Chronic kidney disease, stage 4 (severe): Secondary | ICD-10-CM

## 2021-11-11 DIAGNOSIS — I129 Hypertensive chronic kidney disease with stage 1 through stage 4 chronic kidney disease, or unspecified chronic kidney disease: Secondary | ICD-10-CM

## 2021-11-11 DIAGNOSIS — M1A09X0 Idiopathic chronic gout, multiple sites, without tophus (tophi): Secondary | ICD-10-CM

## 2021-11-11 DIAGNOSIS — E7800 Pure hypercholesterolemia, unspecified: Secondary | ICD-10-CM

## 2021-11-25 ENCOUNTER — Ambulatory Visit (INDEPENDENT_AMBULATORY_CARE_PROVIDER_SITE_OTHER): Payer: Medicare Other

## 2021-11-25 DIAGNOSIS — Z23 Encounter for immunization: Secondary | ICD-10-CM | POA: Diagnosis not present

## 2022-01-25 DIAGNOSIS — R809 Proteinuria, unspecified: Secondary | ICD-10-CM | POA: Diagnosis not present

## 2022-01-25 DIAGNOSIS — I129 Hypertensive chronic kidney disease with stage 1 through stage 4 chronic kidney disease, or unspecified chronic kidney disease: Secondary | ICD-10-CM | POA: Diagnosis not present

## 2022-01-25 DIAGNOSIS — N200 Calculus of kidney: Secondary | ICD-10-CM | POA: Diagnosis not present

## 2022-01-25 DIAGNOSIS — N189 Chronic kidney disease, unspecified: Secondary | ICD-10-CM | POA: Diagnosis not present

## 2022-01-25 DIAGNOSIS — N184 Chronic kidney disease, stage 4 (severe): Secondary | ICD-10-CM | POA: Diagnosis not present

## 2022-01-25 DIAGNOSIS — D631 Anemia in chronic kidney disease: Secondary | ICD-10-CM | POA: Diagnosis not present

## 2022-01-25 DIAGNOSIS — R609 Edema, unspecified: Secondary | ICD-10-CM | POA: Diagnosis not present

## 2022-01-26 ENCOUNTER — Encounter: Payer: Self-pay | Admitting: Family Medicine

## 2022-01-26 ENCOUNTER — Ambulatory Visit (INDEPENDENT_AMBULATORY_CARE_PROVIDER_SITE_OTHER): Payer: Medicare Other | Admitting: Family Medicine

## 2022-01-26 VITALS — BP 153/82 | HR 85 | Temp 98.1°F | Ht 67.0 in | Wt 217.6 lb

## 2022-01-26 DIAGNOSIS — M25511 Pain in right shoulder: Secondary | ICD-10-CM

## 2022-01-26 DIAGNOSIS — E1122 Type 2 diabetes mellitus with diabetic chronic kidney disease: Secondary | ICD-10-CM

## 2022-01-26 DIAGNOSIS — N183 Chronic kidney disease, stage 3 unspecified: Secondary | ICD-10-CM | POA: Diagnosis not present

## 2022-01-26 DIAGNOSIS — Z8546 Personal history of malignant neoplasm of prostate: Secondary | ICD-10-CM

## 2022-01-26 DIAGNOSIS — R600 Localized edema: Secondary | ICD-10-CM

## 2022-01-26 DIAGNOSIS — L209 Atopic dermatitis, unspecified: Secondary | ICD-10-CM

## 2022-01-26 DIAGNOSIS — I129 Hypertensive chronic kidney disease with stage 1 through stage 4 chronic kidney disease, or unspecified chronic kidney disease: Secondary | ICD-10-CM | POA: Diagnosis not present

## 2022-01-26 DIAGNOSIS — E7800 Pure hypercholesterolemia, unspecified: Secondary | ICD-10-CM

## 2022-01-26 DIAGNOSIS — E039 Hypothyroidism, unspecified: Secondary | ICD-10-CM

## 2022-01-26 DIAGNOSIS — K130 Diseases of lips: Secondary | ICD-10-CM

## 2022-01-26 DIAGNOSIS — Z23 Encounter for immunization: Secondary | ICD-10-CM

## 2022-01-26 MED ORDER — AMLODIPINE BESYLATE 5 MG PO TABS: 7.5000 mg | ORAL_TABLET | Freq: Every day | ORAL | 3 refills | Status: DC

## 2022-01-26 MED ORDER — VALSARTAN 320 MG PO TABS: 320.0000 mg | ORAL_TABLET | Freq: Every day | ORAL | 3 refills | Status: DC

## 2022-01-26 MED ORDER — PRAVASTATIN SODIUM 80 MG PO TABS: 80.0000 mg | ORAL_TABLET | Freq: Every day | ORAL | 3 refills | Status: DC

## 2022-01-26 MED ORDER — FENOFIBRATE 160 MG PO TABS: 160.0000 mg | ORAL_TABLET | Freq: Every day | ORAL | 3 refills | Status: DC

## 2022-01-26 MED ORDER — LEVOTHYROXINE SODIUM 112 MCG PO TABS: 112.0000 ug | ORAL_TABLET | Freq: Every day | ORAL | 3 refills | Status: DC

## 2022-01-26 MED ORDER — CLOTRIMAZOLE-BETAMETHASONE 1-0.05 % EX CREA: 1.0000 "application " | TOPICAL_CREAM | Freq: Every day | CUTANEOUS | 0 refills | Status: AC | PRN

## 2022-01-26 NOTE — Progress Notes (Signed)
Subjective: CC:HTN w/ HLD PCP: Janora Norlander, DO SAY:TKZSWF Keith Wiley is a 86 y.o. male presenting to clinic today for:  1. HTN w/ HLD and CKD3b Saw his nephrologist yesterday, who obtained a urinalysis, PTH, CBC and BMP.  Sees Dr. Danie Chandler at [facility].  He reports good urine output but admits that he has lower extremity edema despite use of hydrochlorothiazide daily.  Does not use compression hose.  Compliant with Norvasc, Pravachol, Diovan.  No chest pain, shortness of breath or falls reported  Blood pressure was well controlled at his nephrology visit yesterday at 116/72  2. History of prostate cancer Reports good urine output as above.  We will check PSA today  3.  Chelitis Patient reports that he gets chapping of the angles of his mouth when he eats certain foods.  His previous PCP gave him clotrimazole-betamethasone to apply to these areas as needed.  His tube is quite old and he would like to get a renewal   4.  Shoulder pain Patient reports he has been suffering from some intermittent right-sided shoulder pain that is most prevalent when he is sleeping in bed and rolls onto that side.  This happened previously and he required surgical intervention of his C-spine.  He has had history of surgery on his neck x2.  Denies of any radiation to the right upper extremity, no sensory changes or weakness reported.  He is status post lumbar surgery and is doing well from that.  5.  Hypothyroidism Patient is compliant with Synthroid.  No tremor, change in voice, heart palpitations or change in bowel habits  ROS: Per HPI  Allergies  Allergen Reactions   Augmentin [Amoxicillin-Pot Clavulanate] Diarrhea   Past Medical History:  Diagnosis Date   Back pain    Cancer ([facility])    prostrate   Chronic kidney disease    kidney stones and 1 smaller kidney, history of a kidney blockage   Gout    Hyperlipidemia    Hypertension    dr Elenore Rota moore  pcp    Hypothyroidism    Pneumonia     Current Outpatient Medications:    acetaminophen (TYLENOL) 500 MG tablet, Take 500 mg by mouth every 6 (six) hours as needed for moderate pain., Disp: , Rfl:    allopurinol (ZYLOPRIM) 100 MG tablet, TAKE 1 TABLET BY MOUTH  DAILY, Disp: 90 tablet, Rfl: 0   amLODipine (NORVASC) 5 MG tablet, TAKE 1 AND 1/2 TABLETS BY  MOUTH DAILY, Disp: 135 tablet, Rfl: 3   aspirin EC 81 MG tablet, Take 81 mg by mouth daily., Disp: , Rfl:    aspirin-sod bicarb-citric acid (ALKA-SELTZER) 325 MG TBEF tablet, Take 650 mg by mouth every 6 (six) hours as needed (indigestion)., Disp: , Rfl:    azithromycin (ZITHROMAX) 250 MG tablet, Take 2 tablets today, then take 1 tablet daily until gone., Disp: 6 tablet, Rfl: 0   Cholecalciferol (VITAMIN D) 50 MCG (2000 UT) tablet, Take 2,000 Units by mouth daily., Disp: , Rfl:    fenofibrate 160 MG tablet, TAKE 1 TABLET BY MOUTH  DAILY, Disp: 90 tablet, Rfl: 0   fish oil-omega-3 fatty acids 1000 MG capsule, Take 1 g by mouth 2 (two) times daily., Disp: , Rfl:    hydrochlorothiazide (HYDRODIURIL) 25 MG tablet, TAKE 1 TABLET BY MOUTH  DAILY, Disp: 90 tablet, Rfl: 3   hydrocortisone cream 1 %, Apply 1 application topically daily as needed for itching., Disp: , Rfl:  levothyroxine (SYNTHROID) 112 MCG tablet, Take 1 tablet (112 mcg total) by mouth daily., Disp: 90 tablet, Rfl: 2   methocarbamol (ROBAXIN) 500 MG tablet, Take 1 tablet (500 mg total) by mouth every 6 (six) hours as needed for muscle spasms., Disp: 30 tablet, Rfl: 3   oxyCODONE-acetaminophen (PERCOCET/ROXICET) 5-325 MG tablet, Take 1-2 tablets by mouth every 4 (four) hours as needed for moderate pain or severe pain., Disp: 40 tablet, Rfl: 0   pravastatin (PRAVACHOL) 80 MG tablet, TAKE 1 TABLET BY MOUTH  DAILY, Disp: 90 tablet, Rfl: 1   promethazine-dextromethorphan (PROMETHAZINE-DM) 6.25-15 MG/5ML syrup, Take 2.5 mLs by mouth 4 (four) times daily as needed for cough., Disp: 118 mL, Rfl: 0    sodium chloride (OCEAN) 0.65 % SOLN nasal spray, Place 1 spray into both nostrils daily., Disp: , Rfl:    valsartan (DIOVAN) 320 MG tablet, TAKE 1 TABLET BY MOUTH  DAILY, Disp: 90 tablet, Rfl: 0   vitamin C (ASCORBIC ACID) 500 MG tablet, Take 500 mg by mouth daily., Disp: , Rfl:  Social History   Socioeconomic History   Marital status: Married    Spouse name: Jewel   Number of children: 1   Years of education: Not on file   Highest education level: 12th grade  Occupational History   Occupation: retired    Fish farm manager: [employer]    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   Tobacco Use   Smoking status: Former    Packs/day: 1.00    Years: 20.00    Pack years: 20.00    Types: Cigarettes    Quit date: 04/01/1977    Years since quitting: 44.8   Smokeless tobacco: Never  Vaping Use   Vaping Use: Never used  Substance and Sexual Activity   Alcohol use: No   Drug use: No   Sexual activity: Not on file  Other Topics Concern   Not on file  Social History Narrative   Not on file   Social Determinants of Health   Financial Resource Strain: Not on file  Food Insecurity: Not on file  Transportation Needs: Not on file  Physical Activity: Not on file  Stress: Not on file  Social Connections: Not on file  Intimate Partner Violence: Not on file   Family History  Problem Relation Age of Onset   Heart attack Father    Cancer Brother        unknown origin   Obesity Maternal Grandmother    Cancer Paternal Grandfather        possible -- growth kidney    Anesthesia problems Neg Hx     Objective: Office vital signs reviewed. BP (!) 153/82    Pulse 85    Temp 98.1 F (36.7 C)    Ht 5\' 7"  (1.702 m)    Wt 217 lb 9.6 oz (98.7 kg)    SpO2 100%    BMI 34.08 kg/m   Physical Examination:  General: Awake, alert, well-appearing, No acute distress HEENT: Sclera white.  No carotid bruits.  No exophthalmos or goiter Cardio: regular rate and rhythm, S1S2 heard, no murmurs appreciated Pulm: clear to auscultation  bilaterally, no wheezes, rhonchi or rales; normal work of breathing on room air Extremities: warm, well perfused, trace- +1 LE edema to mid shins, no cyanosis or clubbing; +2 pulses bilaterally MSK: Ambulating independently.  No gross deformity of the shoulder appreciated Skin: dry; intact; no rashes or lesions  Assessment/ Plan: 86 y.o. male   Acquired hypothyroidism - Plan: TSH, T4, Free,  levothyroxine (SYNTHROID) 112 MCG tablet  Hypertension associated with stage 3 chronic kidney disease due to type 2 diabetes mellitus ([place]) - Plan: valsartan (DIOVAN) 320 MG tablet, CANCELED: Renal Function Panel  Pure hypercholesterolemia - Plan: Lipid Panel, fenofibrate 160 MG tablet, pravastatin (PRAVACHOL) 80 MG tablet  History of prostate cancer - Plan: PSA  Bilateral lower extremity edema - Plan: Compression stockings  Cheilitis due to atopic dermatitis - Plan: clotrimazole-betamethasone (LOTRISONE) cream  Acute pain of right shoulder  Asymptomatic from a thyroid standpoint.  Check thyroid levels.  Synthroid renewed  Blood pressure not at goal during today's visit.  Unsure why.  Would like to have his blood pressure rechecked either at home or with nurse in 2 weeks and call to report.  Renal function obtained by his nephrologist.  Check lipid panel, fenofibrate and pravastatin renewed  Check PSA.  Patient likes to have this done every 6 months given history of prostate cancer  Compression stockings ordered for this patient given lower extremity edema.  I hesitate to add Lasix given advanced renal disease.  No chelitis on exam today.  Clotrimazole-betamethasone renewed  Pain right shoulder, offered physical therapy versus eval by orthopedist but he would like to hold off on this for now  Shingles #1 administered today  No orders of the defined types were placed in this encounter.  No orders of the defined types were placed in this encounter.    Janora Norlander, DO [place] [phone]

## 2022-01-26 NOTE — Patient Instructions (Signed)
Edema ?Edema is when you have too much fluid in your body or under your skin. Edema may make your legs, feet, and ankles swell. Swelling often happens in looser tissues, such as around your eyes. This is a common condition. It gets more common as you get older. ?There are many possible causes of edema. These include: ?Eating too much salt (sodium). ?Being on your feet or sitting for a long time. ?Certain medical conditions, such as: ?Pregnancy. ?Heart failure. ?Liver disease. ?Kidney disease. ?Cancer. ?Hot weather may make edema worse. Edema is usually painless. Your skin may look swollen or shiny. ?Follow these instructions at home: ?Medicines ?Take over-the-counter and prescription medicines only as told by your doctor. ?Your doctor may prescribe a medicine to help your body get rid of extra water (diuretic). Take this medicine if you are told to take it. ?Eating and drinking ?Eat a low-salt (low-sodium) diet as told by your doctor. Sometimes, eating less salt may reduce swelling. ?Depending on the cause of your swelling, you may need to limit how much fluid you drink (fluid restriction). ?General instructions ?Raise the injured area above the level of your heart while you are sitting or lying down. ?Do not sit still or stand for a long time. ?Do not wear tight clothes. Do not wear garters on your upper legs. ?Exercise your legs. This can help the swelling go down. ?Wear compression stockings as told by your doctor. It is important that these are the right size. These should be prescribed by your doctor to prevent possible injuries. ?If elastic bandages or wraps are recommended, use them as told by your doctor. ?Contact a doctor if: ?Treatment is not working. ?You have heart, liver, or kidney disease and have symptoms of edema. ?You have sudden and unexplained weight gain. ?Get help right away if: ?You have shortness of breath or chest pain. ?You cannot breathe when you lie down. ?You have pain, redness, or warmth  in the swollen areas. ?You have heart, liver, or kidney disease and get edema all of a sudden. ?You have a fever and your symptoms get worse all of a sudden. ?These symptoms may be an emergency. Get help right away. Call 911. ?Do not wait to see if the symptoms will go away. ?Do not drive yourself to the hospital. ?Summary ?Edema is when you have too much fluid in your body or under your skin. ?Edema may make your legs, feet, and ankles swell. Swelling often happens in looser tissues, such as around your eyes. ?Raise the injured area above the level of your heart while you are sitting or lying down. ?Follow your doctor's instructions about diet and how much fluid you can drink. ?This information is not intended to replace advice given to you by your health care provider. Make sure you discuss any questions you have with your health care provider. ?Document Revised: 08/15/2021 Document Reviewed: 08/15/2021 ?Elsevier Patient Education ? 2022 [employer] ? ?

## 2022-01-27 ENCOUNTER — Ambulatory Visit (INDEPENDENT_AMBULATORY_CARE_PROVIDER_SITE_OTHER): Payer: Medicare Other

## 2022-01-27 VITALS — Wt 217.0 lb

## 2022-01-27 DIAGNOSIS — Z0000 Encounter for general adult medical examination without abnormal findings: Secondary | ICD-10-CM | POA: Diagnosis not present

## 2022-01-27 LAB — LIPID PANEL
Chol/HDL Ratio: 2.5 ratio (ref 0.0–5.0)
Cholesterol, Total: 120 mg/dL (ref 100–199)
HDL: 48 mg/dL (ref >39)
LDL Chol Calc (NIH): 57 mg/dL (ref 0–99)
Triglycerides: 75 mg/dL (ref 0–149)
VLDL Cholesterol Cal: 15 mg/dL (ref 5–40)

## 2022-01-27 LAB — TSH: TSH: 4.56 u[IU]/mL — ABNORMAL HIGH (ref 0.450–4.500)

## 2022-01-27 LAB — T4, FREE: Free T4: 1.41 ng/dL (ref 0.82–1.77)

## 2022-01-27 LAB — PSA: Prostate Specific Ag, Serum: <0.1 ng/mL (ref 0.0–4.0)

## 2022-01-27 NOTE — Patient Instructions (Signed)
Keith Wiley , Thank you for taking time to come for your Medicare Wellness Visit. I appreciate your ongoing commitment to your health goals. Please review the following plan we discussed and let me know if I can assist you in the future.   Screening recommendations/referrals: Colonoscopy: Done 10/09/2018 - no repeat required Recommended yearly ophthalmology/optometry visit for glaucoma screening and checkup Recommended yearly dental visit for hygiene and checkup  Vaccinations: Influenza vaccine: Done 122/2022 - Repeat annually Pneumococcal vaccine: Done 10/13/2013 & 10/30/2018 - no repeats Tdap vaccine: Done 08/21/2011 - Repeat in 10 years *past due Shingles vaccine: Zostavax done 10/25/2010 - Shingrix #1 done 01/26/2022 - get second dose in 2-6 months   Covid-19: Janssen done 04/30/2020 - declines boosters  Advanced directives: Advance directive discussed with you today. Even though you declined this today, please call our office should you change your mind, and we can give you the proper paperwork for you to fill out.   Conditions/risks identified: Aim for 30 minutes of exercise or brisk walking each day, drink 6-8 glasses of water and eat lots of fruits and vegetables.   Next appointment: Follow up in one year for your annual wellness visit.   Preventive Care 37 Years and Older, Male  Preventive care refers to lifestyle choices and visits with your health care provider that can promote health and wellness. What does preventive care include? A yearly physical exam. This is also called an annual well check. Dental exams once or twice a year. Routine eye exams. Ask your health care provider how often you should have your eyes checked. Personal lifestyle choices, including: Daily care of your teeth and gums. Regular physical activity. Eating a healthy diet. Avoiding tobacco and drug use. Limiting alcohol use. Practicing safe sex. Taking low doses of aspirin every day. Taking vitamin and  mineral supplements as recommended by your health care provider. What happens during an annual well check? The services and screenings done by your health care provider during your annual well check will depend on your age, overall health, lifestyle risk factors, and family history of disease. Counseling  Your health care provider may ask you questions about your: Alcohol use. Tobacco use. Drug use. Emotional well-being. Home and relationship well-being. Sexual activity. Eating habits. History of falls. Memory and ability to understand (cognition). Work and work Statistician. Screening  You may have the following tests or measurements: Height, weight, and BMI. Blood pressure. Lipid and cholesterol levels. These may be checked every 5 years, or more frequently if you are over 57 years old. Skin check. Lung cancer screening. You may have this screening every year starting at age 31 if you have a 30-pack-year history of smoking and currently smoke or have quit within the past 15 years. Fecal occult blood test (FOBT) of the stool. You may have this test every year starting at age 48. Flexible sigmoidoscopy or colonoscopy. You may have a sigmoidoscopy every 5 years or a colonoscopy every 10 years starting at age 108. Prostate cancer screening. Recommendations will vary depending on your family history and other risks. Hepatitis C blood test. Hepatitis B blood test. Sexually transmitted disease (STD) testing. Diabetes screening. This is done by checking your blood sugar (glucose) after you have not eaten for a while (fasting). You may have this done every 1-3 years. Abdominal aortic aneurysm (AAA) screening. You may need this if you are a current or former smoker. Osteoporosis. You may be screened starting at age 64 if you are at high risk.  Talk with your health care provider about your test results, treatment options, and if necessary, the need for more tests. Vaccines  Your health care  provider may recommend certain vaccines, such as: Influenza vaccine. This is recommended every year. Tetanus, diphtheria, and acellular pertussis (Tdap, Td) vaccine. You may need a Td booster every 10 years. Zoster vaccine. You may need this after age 55. Pneumococcal 13-valent conjugate (PCV13) vaccine. One dose is recommended after age 59. Pneumococcal polysaccharide (PPSV23) vaccine. One dose is recommended after age 55. Talk to your health care provider about which screenings and vaccines you need and how often you need them. This information is not intended to replace advice given to you by your health care provider. Make sure you discuss any questions you have with your health care provider. Document Released: 01/07/2016 Document Revised: 08/30/2016 Document Reviewed: 10/12/2015 Elsevier Interactive Patient Education  2017 [place] Prevention in the Home Falls can cause injuries. They can happen to people of all ages. There are many things you can do to make your home safe and to help prevent falls. What can I do on the outside of my home? Regularly fix the edges of walkways and driveways and fix any cracks. Remove anything that might make you trip as you walk through a door, such as a raised step or threshold. Trim any bushes or trees on the path to your home. Use bright outdoor lighting. Clear any walking paths of anything that might make someone trip, such as rocks or tools. Regularly check to see if handrails are loose or broken. Make sure that both sides of any steps have handrails. Any raised decks and porches should have guardrails on the edges. Have any leaves, snow, or ice cleared regularly. Use sand or salt on walking paths during winter. Clean up any spills in your garage right away. This includes oil or grease spills. What can I do in the bathroom? Use night lights. Install grab bars by the toilet and in the tub and shower. Do not use towel bars as grab  bars. Use non-skid mats or decals in the tub or shower. If you need to sit down in the shower, use a plastic, non-slip stool. Keep the floor dry. Clean up any water that spills on the floor as soon as it happens. Remove soap buildup in the tub or shower regularly. Attach bath mats securely with double-sided non-slip rug tape. Do not have throw rugs and other things on the floor that can make you trip. What can I do in the bedroom? Use night lights. Make sure that you have a light by your bed that is easy to reach. Do not use any sheets or blankets that are too big for your bed. They should not hang down onto the floor. Have a firm chair that has side arms. You can use this for support while you get dressed. Do not have throw rugs and other things on the floor that can make you trip. What can I do in the kitchen? Clean up any spills right away. Avoid walking on wet floors. Keep items that you use a lot in easy-to-reach places. If you need to reach something above you, use a strong step stool that has a grab bar. Keep electrical cords out of the way. Do not use floor polish or wax that makes floors slippery. If you must use wax, use non-skid floor wax. Do not have throw rugs and other things on the floor that can make  you trip. What can I do with my stairs? Do not leave any items on the stairs. Make sure that there are handrails on both sides of the stairs and use them. Fix handrails that are broken or loose. Make sure that handrails are as long as the stairways. Check any carpeting to make sure that it is firmly attached to the stairs. Fix any carpet that is loose or worn. Avoid having throw rugs at the top or bottom of the stairs. If you do have throw rugs, attach them to the floor with carpet tape. Make sure that you have a light switch at the top of the stairs and the bottom of the stairs. If you do not have them, ask someone to add them for you. What else can I do to help prevent  falls? Wear shoes that: Do not have high heels. Have rubber bottoms. Are comfortable and fit you well. Are closed at the toe. Do not wear sandals. If you use a stepladder: Make sure that it is fully opened. Do not climb a closed stepladder. Make sure that both sides of the stepladder are locked into place. Ask someone to hold it for you, if possible. Clearly mark and make sure that you can see: Any grab bars or handrails. First and last steps. Where the edge of each step is. Use tools that help you move around (mobility aids) if they are needed. These include: Canes. Walkers. Scooters. Crutches. Turn on the lights when you go into a dark area. Replace any light bulbs as soon as they burn out. Set up your furniture so you have a clear path. Avoid moving your furniture around. If any of your floors are uneven, fix them. If there are any pets around you, be aware of where they are. Review your medicines with your doctor. Some medicines can make you feel dizzy. This can increase your chance of falling. Ask your doctor what other things that you can do to help prevent falls. This information is not intended to replace advice given to you by your health care provider. Make sure you discuss any questions you have with your health care provider. Document Released: 10/07/2009 Document Revised: 05/18/2016 Document Reviewed: 01/15/2015 Elsevier Interactive Patient Education  2017 [employer].

## 2022-01-27 NOTE — Progress Notes (Signed)
Subjective:   ORON WESTRUP is a 86 y.o. male who presents for Medicare Annual/Subsequent preventive examination.  Virtual Visit via Telephone Note  I connected with  [name] on 01/27/22 at  8:15 AM EST by telephone and verified that I am speaking with the correct person using two identifiers.  Location: Patient: Home Provider: [facility] Persons participating in the virtual visit: patient/Nurse Health Advisor   I discussed the limitations, risks, security and privacy concerns of performing an evaluation and management service by telephone and the availability of in person appointments. The patient expressed understanding and agreed to proceed.  Interactive audio and video telecommunications were attempted between this nurse and patient, however failed, due to patient having technical difficulties OR patient did not have access to video capability.  We continued and completed visit with audio only.  Some vital signs may be absent or patient reported.   Osvaldo Lamping E Bastian Andreoli, LPN   Review of Systems     Cardiac Risk Factors include: advanced age (>43men, >88 women);male gender;dyslipidemia;obesity (BMI >30kg/m2);sedentary lifestyle;Other (see comment), Risk factor comments: atherosclerosis of aorta, CKD stage 4     Objective:    Today's Vitals   01/27/22 0828  Weight: 217 lb (98.4 kg)  PainSc: 5    Body mass index is 33.99 kg/m.  Advanced Directives 01/27/2022 08/23/2021 08/01/2021 01/25/2021 07/09/2018 05/14/2012 05/14/2012  Does Patient Have a Medical Advance Directive? No No No No No Patient does not have advance directive Patient does not have advance directive  Would patient like information on creating a medical advance directive? No - Patient declined No - Patient declined No - Patient declined No - Patient declined Yes (MAU/Ambulatory/Procedural Areas - Information given) - -  Pre-existing out of facility DNR order (yellow form or pink MOST form) - - - - - - -    Current  Medications (verified) Outpatient Encounter Medications as of 01/27/2022  Medication Sig   acetaminophen (TYLENOL) 500 MG tablet Take 500 mg by mouth every 6 (six) hours as needed for moderate pain.   allopurinol (ZYLOPRIM) 100 MG tablet TAKE 1 TABLET BY MOUTH  DAILY   amLODipine (NORVASC) 5 MG tablet Take 1.5 tablets (7.5 mg total) by mouth daily.   aspirin EC 81 MG tablet Take 81 mg by mouth daily.   aspirin-sod bicarb-citric acid (ALKA-SELTZER) 325 MG TBEF tablet Take 650 mg by mouth every 6 (six) hours as needed (indigestion).   Cholecalciferol (VITAMIN D) 50 MCG (2000 UT) tablet Take 2,000 Units by mouth daily.   clotrimazole-betamethasone (LOTRISONE) cream Apply 1 application topically daily as needed.   fenofibrate 160 MG tablet Take 1 tablet (160 mg total) by mouth daily.   fish oil-omega-3 fatty acids 1000 MG capsule Take 1 g by mouth 2 (two) times daily.   hydrochlorothiazide (HYDRODIURIL) 25 MG tablet TAKE 1 TABLET BY MOUTH  DAILY   hydrocortisone cream 1 % Apply 1 application topically daily as needed for itching.   levothyroxine (SYNTHROID) 112 MCG tablet Take 1 tablet (112 mcg total) by mouth daily.   pravastatin (PRAVACHOL) 80 MG tablet Take 1 tablet (80 mg total) by mouth daily.   sodium chloride (OCEAN) 0.65 % SOLN nasal spray Place 1 spray into both nostrils daily.   valsartan (DIOVAN) 320 MG tablet Take 1 tablet (320 mg total) by mouth daily.   vitamin C (ASCORBIC ACID) 500 MG tablet Take 500 mg by mouth daily.   No facility-administered encounter medications on file as of 01/27/2022.  Allergies (verified) Augmentin [amoxicillin-pot clavulanate]   History: Past Medical History:  Diagnosis Date   Back pain    Cancer ([place])    prostrate   Chronic kidney disease    kidney stones and 1 smaller kidney, history of a kidney blockage   Gout    Hyperlipidemia    Hypertension    dr Elenore Rota moore  pcp   Hypothyroidism    Pneumonia    Past Surgical History:  Procedure  Laterality Date   BACK SURGERY     cervical   1989, 2011   CARPAL TUNNEL RELEASE     COLONOSCOPY     HERNIA REPAIR     LUMBAR LAMINECTOMY/DECOMPRESSION MICRODISCECTOMY N/A 08/23/2021   Procedure: Lumbar one-tow, Lumbar two-three, Lumbar three-four, Lumbar four-five, Lumbar five-Sacral one Laminectomy and foraminotomy;  Surgeon: Kristeen Miss, MD;  Location: [facility];  Service: Neurosurgery;  Laterality: N/A;  3C/RM 20   prostrate     seeds inplant   2009   Family History  Problem Relation Age of Onset   Heart attack Father    Cancer Brother        unknown origin   Obesity Maternal Grandmother    Cancer Paternal Grandfather        possible -- growth kidney    Anesthesia problems Neg Hx    Social History   Socioeconomic History   Marital status: Married    Spouse name: Jewel   Number of children: 1   Years of education: Not on file   Highest education level: 12th grade  Occupational History   Occupation: retired    Fish farm manager: [employer]    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   Tobacco Use   Smoking status: Former    Packs/day: 1.00    Years: 20.00    Pack years: 20.00    Types: Cigarettes    Quit date: 04/01/1977    Years since quitting: 44.8   Smokeless tobacco: Never  Vaping Use   Vaping Use: Never used  Substance and Sexual Activity   Alcohol use: No   Drug use: No   Sexual activity: Not on file  Other Topics Concern   Not on file  Social History Narrative   Doristine Devoid grandson lives with them   Daughter lives next door   Social Determinants of Health   Financial Resource Strain: Low Risk    Difficulty of Paying Living Expenses: Not hard at all  Food Insecurity: No Food Insecurity   Worried About Charity fundraiser in the Last Year: Never true   Arboriculturist in the Last Year: Never true  Transportation Needs: No Transportation Needs   Lack of Transportation (Medical): No   Lack of Transportation (Non-Medical): No  Physical Activity: Insufficiently Active   Days of Exercise per  Week: 7 days   Minutes of Exercise per Session: 20 min  Stress: No Stress Concern Present   Feeling of Stress : Not at all  Social Connections: Socially Integrated   Frequency of Communication with Friends and Family: More than three times a week   Frequency of Social Gatherings with Friends and Family: More than three times a week   Attends Religious Services: More than 4 times per year   Active Member of [employer] or Organizations: Yes   Attends Music therapist: More than 4 times per year   Marital Status: Married    Tobacco Counseling Counseling given: Not Answered   Clinical Intake:  Pre-visit preparation completed: Yes  Pain : 0-10 Pain Score: 5  Pain Type: Acute pain Pain Location: Generalized Pain Descriptors / Indicators: Aching Pain Onset: Yesterday Pain Frequency: Constant     BMI - recorded: 33.99 Nutritional Status: BMI > 30  Obese Nutritional Risks: None Diabetes: No  How often do you need to have someone help you when you read instructions, pamphlets, or other written materials from your doctor or pharmacy?: 1 - Never  Diabetic? no  Interpreter Needed?: No  Information entered by :: Marleah Beever, LPN   Activities of Daily Living In your present state of health, do you have any difficulty performing the following activities: 01/27/2022 08/23/2021  Hearing? N -  Vision? N -  Difficulty concentrating or making decisions? N -  Walking or climbing stairs? N -  Dressing or bathing? N -  Doing errands, shopping? N N  Preparing Food and eating ? N -  Using the Toilet? N -  In the past six months, have you accidently leaked urine? Y -  Comment mild - improved - seeing Urology -  Do you have problems with loss of bowel control? N -  Managing your Medications? N -  Managing your Finances? N -  Housekeeping or managing your Housekeeping? N -  Some recent data might be hidden    Patient Care Team: Janora Norlander, DO as PCP - General (Family  Medicine) Irine Seal, MD as Attending Physician (Urology) Edrick Oh, MD as Consulting Physician (Nephrology) Clarene Essex, MD as Consulting Physician (Gastroenterology) Kristeen Miss, MD as Consulting Physician (Neurosurgery) Celestia Khat, [place] (Optometry) Kidney, [place] any recent Medical Services you may have received from other than [facility] providers in the past year (date may be approximate).     Assessment:   This is a routine wellness examination for Cade.  Hearing/Vision screen Hearing Screening - Comments:: Denies hearing difficulties   Vision Screening - Comments:: Wears rx glasses prn only - up to date with routine eye exams with [facility]  Dietary issues and exercise activities discussed: Current Exercise Habits: Home exercise routine, Type of exercise: walking, Time (Minutes): 20, Frequency (Times/Week): 7, Weekly Exercise (Minutes/Week): 140, Intensity: Mild, Exercise limited by: orthopedic condition(s)   Goals Addressed             This Visit's Progress    AWV   On track    01/27/2022 AWV Goal: Fall Prevention  Over the next year, patient will decrease their risk for falls by: Using assistive devices, such as a cane or walker, as needed Identifying fall risks within their home and correcting them by: Removing throw rugs Adding handrails to stairs or ramps Removing clutter and keeping a clear pathway throughout the home Increasing light, especially at night Adding shower handles/bars Raising toilet seat Identifying potential personal risk factors for falls: Medication side effects Incontinence/urgency Vestibular dysfunction Hearing loss Musculoskeletal disorders Neurological disorders Orthostatic hypotension       Prevent falls   On track    Stay active Try and get in 3 meals each day        Depression Screen PHQ 2/9 Scores 01/27/2022 01/26/2022 07/26/2021 04/07/2021 03/10/2021 03/08/2021 01/25/2021  PHQ - 2 Score 0 0 0 0 0 0 0  PHQ- 9  Score - - - - - 0 -    Fall Risk Fall Risk  01/27/2022 01/26/2022 07/26/2021 07/26/2021 04/07/2021  Falls in the past year? 0 0 1 0 0  Number falls in past yr: 0 - 0 - -  Injury with Fall? 0 - 0 - -  Risk for fall due to : Orthopedic patient - History of fall(s) - -  Follow up Falls prevention discussed - Falls evaluation completed - -    FALL RISK PREVENTION PERTAINING TO THE HOME:  Any stairs in or around the home? Yes  If so, are there any without handrails? No  Home free of loose throw rugs in walkways, pet beds, electrical cords, etc? Yes  Adequate lighting in your home to reduce risk of falls? Yes   ASSISTIVE DEVICES UTILIZED TO PREVENT FALLS:  Life alert? No  Use of a cane, walker or w/c? No  Grab bars in the bathroom? No  Shower chair or bench in shower? No  Elevated toilet seat or a handicapped toilet? No   TIMED UP AND GO:  Was the test performed? No . Telephonic visit  Cognitive Function: Normal cognitive status assessed by direct observation by this Nurse Health Advisor. No abnormalities found.    MMSE - Mini Mental State Exam 07/09/2018  Orientation to time 5  Orientation to Place 5  Registration 3  Attention/ Calculation 5  Recall 2  Language- name 2 objects 2  Language- repeat 1  Language- follow 3 step command 3  Language- read & follow direction 1  Write a sentence 1  Copy design 1  Total score 29     6CIT Screen 01/25/2021  What Year? 0 points  What month? 0 points  What time? 0 points  Count back from 20 0 points  Months in reverse 0 points  Repeat phrase 2 points  Total Score 2    Immunizations Immunization History  Administered Date(s) Administered   Fluad Quad(high Dose 65+) 11/04/2019, 12/03/2020, 11/25/2021   Influenza, High Dose Seasonal PF 10/10/2016, 09/27/2017, 10/30/2018   Influenza,inj,Quad PF,6+ Mos 10/13/2013, 11/04/2014, 11/09/2015   Janssen (J&J) SARS-COV-2 Vaccination 04/30/2020   Pneumococcal Conjugate-13 10/13/2013    Pneumococcal Polysaccharide-23 10/30/2018   Tdap 08/21/2011   Zoster Recombinat (Shingrix) 01/26/2022   Zoster, Live 08/21/2010    TDAP status: Due, Education has been provided regarding the importance of this vaccine. Advised may receive this vaccine at local pharmacy or Health Dept. Aware to provide a copy of the vaccination record if obtained from local pharmacy or Health Dept. Verbalized acceptance and understanding.  Flu Vaccine status: Up to date  Pneumococcal vaccine status: Up to date  Covid-19 vaccine status: Information provided on how to obtain vaccines.   Qualifies for Shingles Vaccine? Yes   Zostavax completed Yes   Shingrix Completed?: No.    Education has been provided regarding the importance of this vaccine. Patient has been advised to call insurance company to determine out of pocket expense if they have not yet received this vaccine. Advised may also receive vaccine at local pharmacy or Health Dept. Verbalized acceptance and understanding.  Screening Tests Health Maintenance  Topic Date Due   COVID-19 Vaccine (2 - Janssen risk series) 02/11/2022 (Originally 05/28/2020)   TETANUS/TDAP  01/26/2023 (Originally 08/20/2021)   Zoster Vaccines- Shingrix (2 of 2) 03/23/2022   Pneumonia Vaccine 76+ Years old  Completed   INFLUENZA VACCINE  Completed   HPV VACCINES  Aged Out    Health Maintenance  There are no preventive care reminders to display for this patient.  Colorectal cancer screening: No longer required.   Lung Cancer Screening: (Low Dose CT Chest recommended if Age 74-80 years, 30 pack-year currently smoking OR have quit w/in 15years.) does not qualify.  Additional Screening:  Hepatitis C Screening: does not qualify  Vision Screening: Recommended annual ophthalmology exams for early detection of glaucoma and other disorders of the eye. Is the patient up to date with their annual eye exam?  No  Who is the provider or what is the name of the office in which  the patient attends annual eye exams? [facility] If pt is not established with a provider, would they like to be referred to a provider to establish care? No .   Dental Screening: Recommended annual dental exams for proper oral hygiene  Community Resource Referral / Chronic Care Management: CRR required this visit?  No   CCM required this visit?  No      Plan:     I have personally reviewed and noted the following in the patients chart:   Medical and social history Use of alcohol, tobacco or illicit drugs  Current medications and supplements including opioid prescriptions. Patient is not currently taking opioid prescriptions. Functional ability and status Nutritional status Physical activity Advanced directives List of other physicians Hospitalizations, surgeries, and ER visits in previous 12 months Vitals Screenings to include cognitive, depression, and falls Referrals and appointments  In addition, I have reviewed and discussed with patient certain preventive protocols, quality metrics, and best practice recommendations. A written personalized care plan for preventive services as well as general preventive health recommendations were provided to patient.     Sandrea Hammond, LPN   4/0/3474   Nurse Notes: He got Shingrix yesterday and has had chills and body aches since then - advised tylenol, rest, fluids - call if no better, but likely normal response - He doesn't think he will get second dose.

## 2022-01-29 ENCOUNTER — Other Ambulatory Visit: Payer: Self-pay | Admitting: Family Medicine

## 2022-01-29 DIAGNOSIS — M1A09X0 Idiopathic chronic gout, multiple sites, without tophus (tophi): Secondary | ICD-10-CM

## 2022-02-09 ENCOUNTER — Ambulatory Visit: Payer: Medicare Other

## 2022-02-09 ENCOUNTER — Telehealth: Payer: Self-pay

## 2022-02-09 VITALS — BP 162/85 | HR 69

## 2022-02-09 DIAGNOSIS — Z0130 Encounter for examination of blood pressure without abnormal findings: Secondary | ICD-10-CM

## 2022-02-09 NOTE — Telephone Encounter (Signed)
Patient came in today for blood pressure check and while he was here he left a reading of recent blood pressures at home:  149/74 150/78 147/77 144/77 152/84 124/73 138/80 141/72 146/72

## 2022-02-09 NOTE — Progress Notes (Cosign Needed)
Patient here today for blood pressure check.  Blood pressure is 152/83, pulse 69.  Patient sat for a few minutes and blood pressure was rechecked at 162/85.

## 2022-02-10 NOTE — Telephone Encounter (Signed)
These are acceptable range. No changes needed

## 2022-02-10 NOTE — Telephone Encounter (Signed)
Left message to call back  

## 2022-02-20 NOTE — Telephone Encounter (Signed)
Patient aware.

## 2022-03-28 ENCOUNTER — Ambulatory Visit (INDEPENDENT_AMBULATORY_CARE_PROVIDER_SITE_OTHER): Payer: Medicare Other | Admitting: Nurse Practitioner

## 2022-03-28 ENCOUNTER — Encounter: Payer: Self-pay | Admitting: Nurse Practitioner

## 2022-03-28 DIAGNOSIS — J069 Acute upper respiratory infection, unspecified: Secondary | ICD-10-CM

## 2022-03-28 MED ORDER — DOXYCYCLINE HYCLATE 100 MG PO TABS: 100.0000 mg | ORAL_TABLET | Freq: Two times a day (BID) | ORAL | 0 refills | Status: DC

## 2022-03-28 MED ORDER — FLUTICASONE PROPIONATE 50 MCG/ACT NA SUSP: 2.0000 | Freq: Every day | NASAL | 6 refills | Status: DC

## 2022-03-28 MED ORDER — PROMETHAZINE-DM 6.25-15 MG/5ML PO SYRP: 5.0000 mL | ORAL_SOLUTION | Freq: Four times a day (QID) | ORAL | 0 refills | Status: DC | PRN

## 2022-03-28 NOTE — Patient Instructions (Signed)

## 2022-03-28 NOTE — Progress Notes (Signed)
? ?Virtual Visit  Note ?Due to COVID-19 pandemic this visit was conducted virtually. This visit type was conducted due to national recommendations for restrictions regarding the COVID-19 Pandemic (e.g. social distancing, sheltering in place) in an effort to limit this patient's exposure and mitigate transmission in our community. All issues noted in this document were discussed and addressed.  A physical exam was not performed with this format. ? ?I connected with Keith Wiley on 03/28/22 at 9:54 by telephone and verified that I am speaking with the correct person using two identifiers. Keith Wiley is currently located at home and his wife is currently with him during visit. The provider, Mary-Margaret Hassell Done, FNP is located in their office at time of visit. ? ?I discussed the limitations, risks, security and privacy concerns of performing an evaluation and management service by telephone and the availability of in person appointments. I also discussed with the patient that there may be a patient responsible charge related to this service. The patient expressed understanding and agreed to proceed. ? ? ?History and Present Illness: ? ?URI  ?This is a new problem. The current episode started in the past 7 days. The problem has been gradually worsening. The maximum temperature recorded prior to his arrival was 100.4 - 100.9 F. The fever has been present for Less than 1 day. Associated symptoms include congestion, coughing, headaches, rhinorrhea, sinus pain and sneezing. Pertinent negatives include no sore throat. He has tried decongestant for the symptoms. The treatment provided mild relief.  ? ? ? ?Review of Systems  ?Constitutional:  Positive for chills, fever and malaise/fatigue.  ?HENT:  Positive for congestion, rhinorrhea, sinus pain and sneezing. Negative for sore throat.   ?Respiratory:  Positive for cough.   ?Musculoskeletal:  Negative for myalgias.  ?Neurological:  Positive for headaches.   ? ? ?Observations/Objective: ?Alert and oriented- answers all questions appropriately ?No distress ?Raspy voice ?Dry cough ? ?Assessment and Plan: ?Keith Wiley in today with chief complaint of URI ? ? ?1. URI with cough and congestion ?1. Take meds as prescribed ?2. Use a cool mist humidifier especially during the winter months and when heat has been humid. ?3. Use saline nose sprays frequently ?4. Saline irrigations of the nose can be very helpful if done frequently. ? * 4X daily for 1 week* ? * Use of a nettie pot can be helpful with this. Follow directions with this* ?5. Drink plenty of fluids ?6. Keep thermostat turn down low ?7.For any cough or congestion- promathazine DM ?8. For fever or aces or pains- take tylenol or ibuprofen appropriate for age and weight. ? * for fevers greater than 101 orally you may alternate ibuprofen and tylenol every  3 hours. ?  ?Meds ordered this encounter  ?Medications  ? fluticasone (FLONASE) 50 MCG/ACT nasal spray  ?  Sig: Place 2 sprays into both nostrils daily.  ?  Dispense:  16 g  ?  Refill:  6  ?  Order Specific Question:   Supervising Provider  ?  Answer:   Caryl Pina A [[id number]]  ? doxycycline (VIBRA-TABS) 100 MG tablet  ?  Sig: Take 1 tablet (100 mg total) by mouth 2 (two) times daily. 1 po bid  ?  Dispense:  20 tablet  ?  Refill:  0  ?  Order Specific Question:   Supervising Provider  ?  Answer:   Caryl Pina A [[id number]]  ? promethazine-dextromethorphan (PROMETHAZINE-DM) 6.25-15 MG/5ML syrup  ?  Sig: Take 5 mLs by  mouth 4 (four) times daily as needed for cough.  ?  Dispense:  118 mL  ?  Refill:  0  ?  Order Specific Question:   Supervising Provider  ?  Answer:   Caryl Pina A [[id number]]  ? ? ? ? ? ?Follow Up Instructions: ?prn ? ?  ?I discussed the assessment and treatment plan with the patient. The patient was provided an opportunity to ask questions and all were answered. The patient agreed with the plan and demonstrated an understanding of the  instructions. ?  ?The patient was advised to call back or seek an in-person evaluation if the symptoms worsen or if the condition fails to improve as anticipated. ? ?The above assessment and management plan was discussed with the patient. The patient verbalized understanding of and has agreed to the management plan. Patient is aware to call the clinic if symptoms persist or worsen. Patient is aware when to return to the clinic for a follow-up visit. Patient educated on when it is appropriate to go to the emergency department.  ? ?Time call ended:  10:05 ? ?I provided 11 minutes of  non face-to-face time during this encounter. ? ? ? ?Mary-Margaret Hassell Done, FNP ? ? ?

## 2022-04-26 ENCOUNTER — Other Ambulatory Visit: Payer: Self-pay | Admitting: Family Medicine

## 2022-04-26 DIAGNOSIS — M1A09X0 Idiopathic chronic gout, multiple sites, without tophus (tophi): Secondary | ICD-10-CM

## 2022-04-30 IMAGING — RF DG MYELOGRAM LUMBAR
10 series · 10 of 10 positions shown · non-contrast
Comparison: Lumbar spine MRI 09/05/2016.

CLINICAL DATA: 85-year-old male with chronic scoliosis. Neurogenic
claudication. Back, right hip and leg pain.

EXAM:
LUMBAR MYELOGRAM
FLUOROSCOPY TIME:  0 minutes 54 seconds
PROCEDURE:
Lumbar puncture and intrathecal contrast administration were
performed by Dr. YORDANI STRATTON who will separately report for the
portion of the procedure. I personally supervised acquisition of the
myelogram images.
TECHNIQUE: Contiguous axial images were obtained through the Lumbar spine after
the intrathecal infusion of infusion. Coronal and sagittal
reconstructions were obtained of the axial image sets.

[Series 1: cp_standard · 0.27mm/px · 1 of 1 slices shown (1 of 3)]
[im 1/1]
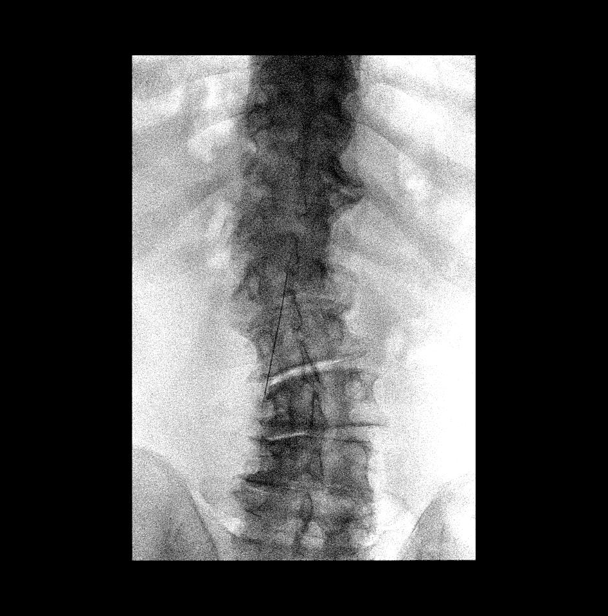

[Series 2: cp_standard · 0.26mm/px · 1 of 1 slices shown (2 of 3)]
[im 1/1]
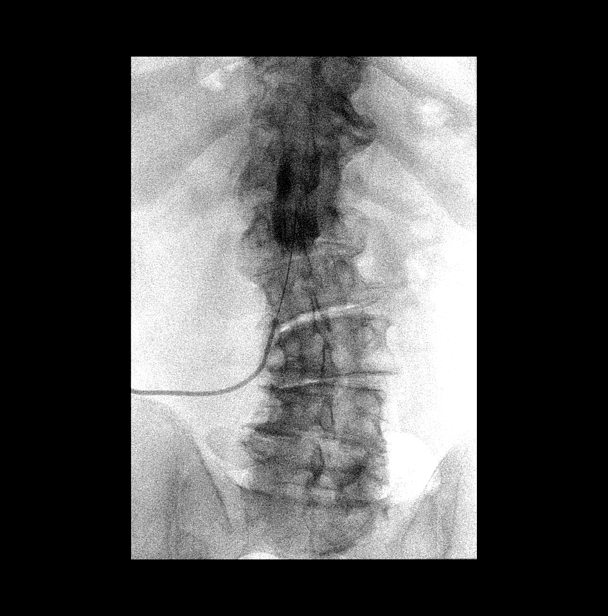

[Series 3: fluoro_myelogram_singleshot_bw · 0.20mm/px · 1 of 1 slices shown (1 of 7)]
[im 1/1]
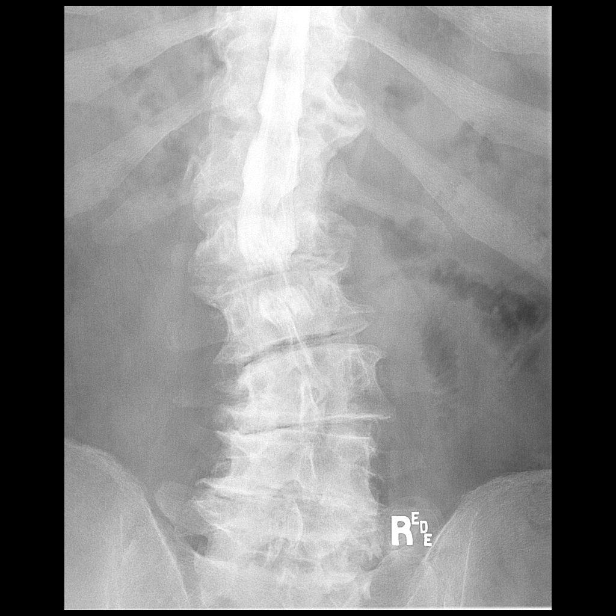

[Series 4: fluoro_myelogram_singleshot_bw · 0.20mm/px · 1 of 1 slices shown (2 of 7)]
[im 1/1]
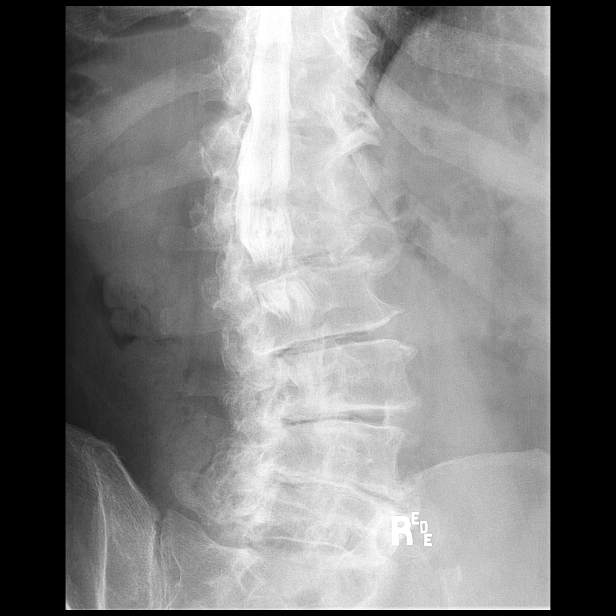

[Series 5: fluoro_myelogram_singleshot_bw · 0.20mm/px · 1 of 1 slices shown (3 of 7)]
[im 1/1]
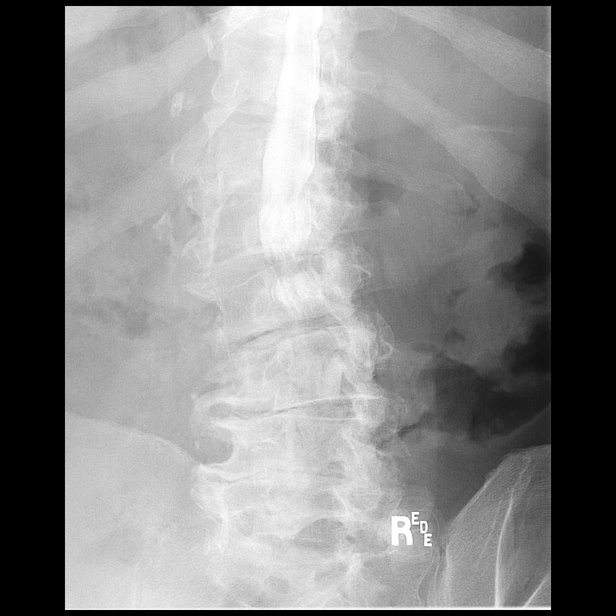

[Series 6: fluoro_myelogram_singleshot_bw · 0.19mm/px · 1 of 1 slices shown (4 of 7)]
[im 1/1]
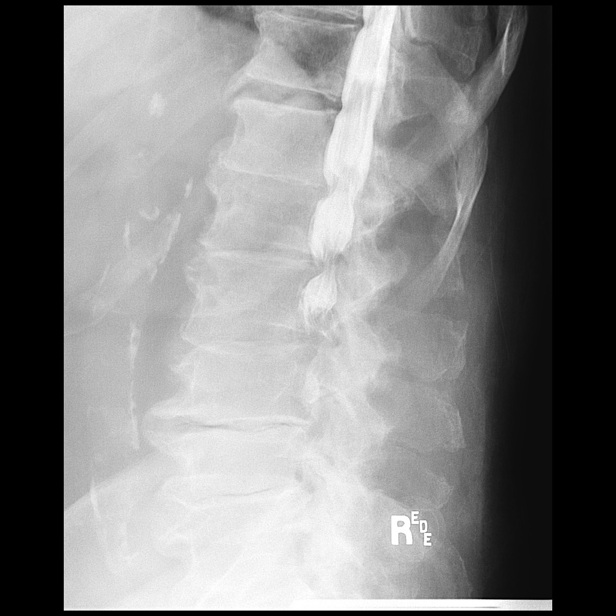

[Series 7: fluoro_myelogram_singleshot_bw · 0.18mm/px · 1 of 1 slices shown (5 of 7)]
[im 1/1]
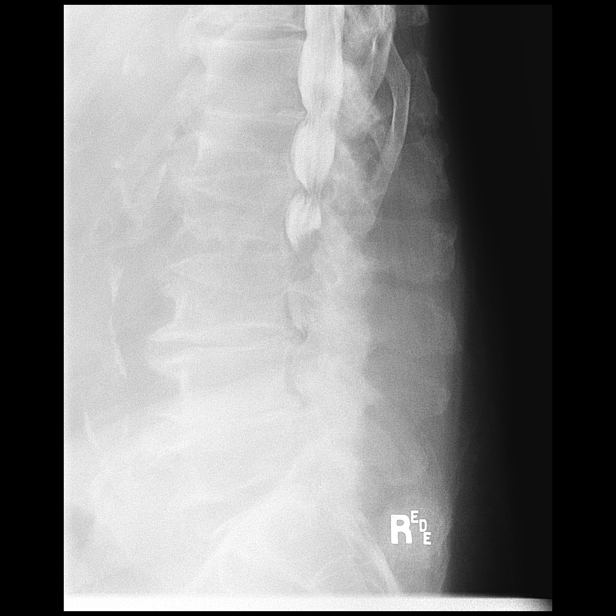

[Series 8: fluoro_myelogram_singleshot_bw · 0.17mm/px · 1 of 1 slices shown (6 of 7)]
[im 1/1]
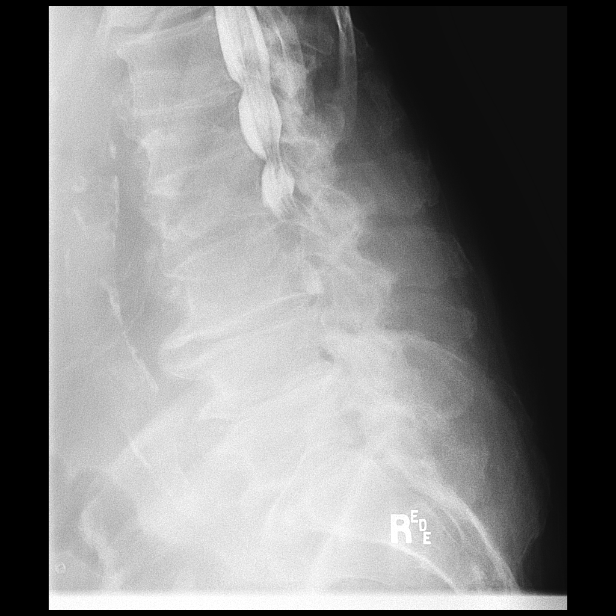

[Series 9: fluoro_myelogram_singleshot_bw · 0.17mm/px · 1 of 1 slices shown (7 of 7)]
[im 1/1]
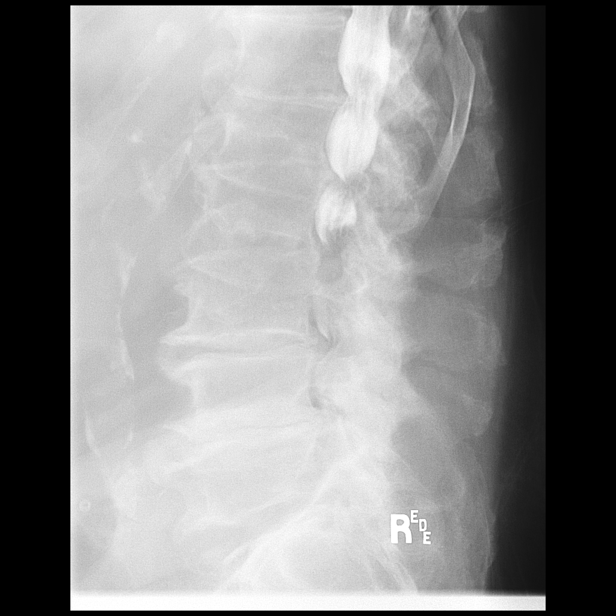

[Series 10: cp_standard · 0.17mm/px · 1 of 1 slices shown (3 of 3)]
[im 1/1]
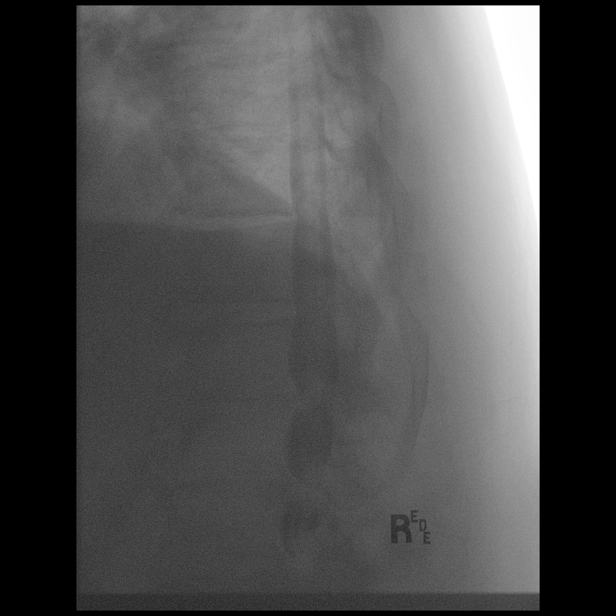

[10 of 10 positions shown; findings below may reference images not displayed]

FINDINGS: LUMBAR MYELOGRAM FINDINGS:

Lumbar segmentation appears to be normal, see details below. Reverse
S-shaped thoracolumbar scoliosis is mild-to-moderate and not
significantly changed since 4804.

Calcified aortic atherosclerosis.

Diffuse lumbar disc space loss with multilevel vacuum disc and bulky
endplate spurring.

Good thecal sac contrast opacification from L1 and above, but severe
mass effect on the thecal sac at L1-L2 and little to no lumbar
intrathecal contrast consistent with widespread severe lumbar spinal
stenosis.

Lateral decubitus view as well as upright lateral views in neutral,
flexion, and extension positioning demonstrate no abnormal motion.
Decreased range of motion. Visible lower thoracic spinal canal
patency appears relatively normal above T12-L1.

CT LUMBAR MYELOGRAM FINDINGS:

Segmentation: Normal, although there is interbody ankylosis at
T12-L1. See additional details below.

Alignment: Moderate dextroconvex lumbar scoliosis apex at L3. Mild
levoconvex lower thoracic and thoracolumbar junction level
scoliosis.

Vertebrae: No acute osseous abnormality identified. Intact visible
SI joints.

Abdominal and paraspinal soft tissues: Mild respiratory motion at
the lung bases which appear clear. Aortoiliac calcified
atherosclerosis. Visible abdominal aorta has a normal caliber.
Chronic right renal cysts. Exophytic small right renal cyst on
series 5, image 39 has simple fluid density.

Relatively normal myelographic appearance of the lower thoracic
spinal cord with conus medullaris at T12-L1.

T9-T10: Interbody ankylosis via flowing endplate osteophytes
anteriorly. Mild facet hypertrophy. No significant spinal stenosis.

T10-T11: Disc space loss with circumferential disc osteophyte
complex. Mild to moderate facet hypertrophy. Borderline to mild
spinal stenosis. Moderate to severe right T10 foraminal stenosis
primarily due to facet disease.

T11-T12: Circumferential disc osteophyte complex with mild facet
hypertrophy. No spinal stenosis.

T12-L1: Loss of the disc space with right side interbody ankylosis,
new since 4804. Circumferential disc osteophyte complex with
moderate facet and ligament flavum hypertrophy. Mild spinal stenosis
here at the level of the conus. Moderate osseous right T12 foraminal
stenosis.

L1-L2: Vacuum disc and disc space loss. Bulky disc osteophyte
complex eccentric to the left. Moderate to severe ligament flavum
and moderate facet hypertrophy. Moderate to severe spinal stenosis
(series 5, image 65) with lateral recess stenosis greater on the
left. Moderate to severe right L1 foraminal stenosis.

L2-L3: Disc space loss with vacuum disc and very bulky
circumferential disc osteophyte complex. Severe facet and ligament
flavum hypertrophy. Severe spinal stenosis. Moderate L2 foraminal
stenosis greater on the left.

L3-L4: Severe disc space loss with vacuum disc. Bulky
circumferential disc osteophyte complex. Moderate to severe facet
and ligament flavum hypertrophy greater on the left. Moderate to
severe spinal stenosis and severe left lateral recess stenosis.
Moderate to severe left L3 foraminal stenosis.

L4-L5: Severe disc space loss with vacuum disc. Bulky
circumferential disc osteophyte complex. Moderate ligament flavum
and moderate to severe facet hypertrophy. Moderate to severe spinal
and right greater than left lateral recess stenosis (series 5, image
105, descending L5 nerve levels). Moderate to severe L4 foraminal
stenosis is greater on the right.

L5-S1: Better preserved disc space but vacuum disc. Bulky
circumferential disc osteophyte complex and severe right facet
hypertrophy. Moderate ligament flavum and left facet hypertrophy.
Some epidural lipomatosis. Moderate to severe spinal and bilateral
lateral recess stenosis (S1 nerve levels). Moderate L5 foraminal
stenosis appears greater on the left.
IMPRESSION: 1. Thoracolumbar scoliosis with acquired interbody ankylosis at
T12-L1 since 4804. Diffuse severe lumbar disc and endplate
degeneration. Widespread lower thoracic and lumbar moderate to
severe facet degeneration.

2. Subsequent moderate to severe spinal stenosis throughout the
entire lumbar spine.
Lateral recess stenosis appears maximal and/or greater on the right
at the L4-L5 and L5-S1 levels, where moderate or severe neural
foraminal stenosis also appears greater on the right. Query right L4
through S1 radiculitis.

3. Mild lower thoracic spinal stenosis at T10-T11, and at the level
of the conus at T12-L1. Moderate to severe neural foraminal stenosis
also at the right T10 nerve level.

4. Aortic Atherosclerosis ([id number]-[id number].L).

## 2022-07-24 ENCOUNTER — Ambulatory Visit (INDEPENDENT_AMBULATORY_CARE_PROVIDER_SITE_OTHER): Payer: Medicare Other | Admitting: Family Medicine

## 2022-07-24 ENCOUNTER — Encounter: Payer: Self-pay | Admitting: Family Medicine

## 2022-07-24 VITALS — BP 137/73 | HR 83 | Temp 98.0°F | Ht 67.0 in | Wt 215.0 lb

## 2022-07-24 DIAGNOSIS — L570 Actinic keratosis: Secondary | ICD-10-CM | POA: Diagnosis not present

## 2022-07-24 DIAGNOSIS — E039 Hypothyroidism, unspecified: Secondary | ICD-10-CM | POA: Diagnosis not present

## 2022-07-24 DIAGNOSIS — Z2821 Immunization not carried out because of patient refusal: Secondary | ICD-10-CM | POA: Diagnosis not present

## 2022-07-24 DIAGNOSIS — N183 Chronic kidney disease, stage 3 unspecified: Secondary | ICD-10-CM

## 2022-07-24 DIAGNOSIS — E1122 Type 2 diabetes mellitus with diabetic chronic kidney disease: Secondary | ICD-10-CM

## 2022-07-24 DIAGNOSIS — Z6379 Other stressful life events affecting family and household: Secondary | ICD-10-CM | POA: Diagnosis not present

## 2022-07-24 DIAGNOSIS — I129 Hypertensive chronic kidney disease with stage 1 through stage 4 chronic kidney disease, or unspecified chronic kidney disease: Secondary | ICD-10-CM

## 2022-07-24 DIAGNOSIS — Z8546 Personal history of malignant neoplasm of prostate: Secondary | ICD-10-CM

## 2022-07-24 DIAGNOSIS — R7989 Other specified abnormal findings of blood chemistry: Secondary | ICD-10-CM

## 2022-07-24 NOTE — Progress Notes (Signed)
Subjective: CC: Follow-up hypothyroidism PCP: Janora Norlander, DO ACZ:YSAYTK E Keith Wiley is a 86 y.o. male presenting to clinic today for:  1.  Acquired hypothyroidism Patient reports compliance with thyroid medications.  No reports of changes in bowel habits, tremors or heart palpitations.  2.  CKD 3 Patient thinks he missed his appointment with his nephrologist.  He has an appoint with his urologist coming up soon.  Would like to get his labs collected today.  Reports normal urine output.  3.  Skin lesions His grandson was very concerned about some skin lesions that he has on his right and left forearms.  He does not report any spontaneous bleeding but they are crusty.  He has required cryoablation previously elsewhere  ROS: Per HPI  Allergies  Allergen Reactions   Augmentin [Amoxicillin-Pot Clavulanate] Diarrhea   Past Medical History:  Diagnosis Date   Back pain    Cancer ([place])    prostrate   Chronic kidney disease    kidney stones and 1 smaller kidney, history of a kidney blockage   Gout    Hyperlipidemia    Hypertension    dr Elenore Rota moore  pcp   Hypothyroidism    Pneumonia     Current Outpatient Medications:    allopurinol (ZYLOPRIM) 100 MG tablet, TAKE 1 TABLET BY MOUTH DAILY, Disp: 100 tablet, Rfl: 0   amLODipine (NORVASC) 5 MG tablet, Take 1.5 tablets (7.5 mg total) by mouth daily., Disp: 135 tablet, Rfl: 3   Cholecalciferol (VITAMIN D) 50 MCG (2000 UT) tablet, Take 2,000 Units by mouth daily., Disp: , Rfl:    fenofibrate 160 MG tablet, Take 1 tablet (160 mg total) by mouth daily., Disp: 90 tablet, Rfl: 3   fish oil-omega-3 fatty acids 1000 MG capsule, Take 1 g by mouth 2 (two) times daily., Disp: , Rfl:    hydrochlorothiazide (HYDRODIURIL) 25 MG tablet, TAKE 1 TABLET BY MOUTH  DAILY, Disp: 90 tablet, Rfl: 3   hydrocortisone cream 1 %, Apply 1 application topically daily as needed for itching., Disp: , Rfl:    levothyroxine (SYNTHROID) 112 MCG tablet, Take 1  tablet (112 mcg total) by mouth daily., Disp: 90 tablet, Rfl: 3   pravastatin (PRAVACHOL) 80 MG tablet, Take 1 tablet (80 mg total) by mouth daily., Disp: 90 tablet, Rfl: 3   sodium chloride (OCEAN) 0.65 % SOLN nasal spray, Place 1 spray into both nostrils daily., Disp: , Rfl:    valsartan (DIOVAN) 320 MG tablet, Take 1 tablet (320 mg total) by mouth daily., Disp: 90 tablet, Rfl: 3   vitamin C (ASCORBIC ACID) 500 MG tablet, Take 500 mg by mouth daily., Disp: , Rfl:    acetaminophen (TYLENOL) 500 MG tablet, Take 500 mg by mouth every 6 (six) hours as needed for moderate pain. (Patient not taking: Reported on 07/24/2022), Disp: , Rfl:    clotrimazole-betamethasone (LOTRISONE) cream, Apply 1 application topically daily as needed. (Patient not taking: Reported on 07/24/2022), Disp: 30 g, Rfl: 0   fluticasone (FLONASE) 50 MCG/ACT nasal spray, Place 2 sprays into both nostrils daily. 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   Tobacco Use  Smoking status: Former    Packs/day: 1.00    Years: 20.00    Total pack years: 20.00    Types: Cigarettes    Quit date: 04/01/1977    Years since quitting: 45.3   Smokeless tobacco: Never  Vaping Use   Vaping Use: Never used  Substance and Sexual Activity   Alcohol use: No   Drug use: No   Sexual activity: Not on file  Other Topics Concern   Not on file  Social History Narrative   Doristine Devoid grandson lives with them   Daughter lives next door   Social Determinants of Health   Financial Resource Strain: [place]  (01/27/2022)   Overall Financial Resource Strain (CARDIA)    Difficulty of Paying Living Expenses: Not hard at all  Food Insecurity: No Food Insecurity  (01/27/2022)   Hunger Vital Sign    Worried About Running Out of Food in the Last Year: Never true    [place] in the Last Year: Never true  Transportation Needs: No Transportation Needs (01/27/2022)   PRAPARE - Hydrologist (Medical): No    Lack of Transportation (Non-Medical): No  Physical Activity: Insufficiently Active (01/27/2022)   Exercise Vital Sign    Days of Exercise per Week: 7 days    Minutes of Exercise per Session: 20 min  Stress: No Stress Concern Present (01/27/2022)   [place]    Feeling of Stress : Not at all  Social Connections: Socially Integrated (01/27/2022)   Social Connection and Isolation Panel [NHANES]    Frequency of Communication with Friends and Family: More than three times a week    Frequency of Social Gatherings with Friends and Family: More than three times a week    Attends Religious Services: More than 4 times per year    Active Member of [employer] or Organizations: Yes    Attends Music therapist: More than 4 times per year    Marital Status: Married  Human resources officer Violence: Not At Risk (01/27/2022)   Humiliation, Afraid, Rape, and Kick questionnaire    Fear of Current or Ex-Partner: No    Emotionally Abused: No    Physically Abused: No    Sexually Abused: No   Family History  Problem Relation Age of Onset   Heart attack Father    Cancer Brother        unknown origin   Obesity Maternal Grandmother    Cancer Paternal Grandfather        possible -- growth kidney    Anesthesia problems Neg Hx     Objective: Office vital signs reviewed. BP 137/73   Pulse 83   Temp 98 F (36.7 C)   Ht '5\' 7"'$  (1.702 m)   Wt 215 lb (97.5 kg)   SpO2 94%   BMI 33.67 kg/m   Physical Examination:  General: Awake, alert, well nourished, No acute distress HEENT: Sclera white.  No exopthalamos. Cardio: regular rate and rhythm, S1S2 heard, soft systolic  murmurs appreciated Pulm: clear to auscultation bilaterally, no wheezes, rhonchi or rales; normal work of breathing on room air MSK: Ambulating independently with normal gait and station Skin: Several hyperkeratotic lesions noted along bilateral forearms.  He has a semilunar lesion on the left forearm, 2 smaller lesions along the distal forearm.  He has a very large lesion on the right forearm and one between the knuckles of digits 2 and 3 on  the right   Cryotherapy Procedure:  Risks and benefits of procedure were reviewed with the patient.  Written consent obtained and scanned into the chart.  Lesion of concern was identified and located on left and right forearms (x5 see above location descriptions).  Liquid nitrogen was applied to area of concern and extending out 1 millimeters beyond the border of the lesion.  Treated area was allowed to come back to room temperature before treating it a second time.  Patient tolerated procedure well and there were no immediate complications.  Home care instructions were reviewed with the patient and a handout was provided.  Assessment/ Plan: 86 y.o. male   Acquired hypothyroidism - Plan: TSH, T4, Free  Hypertension associated with stage 3 chronic kidney disease due to type 2 diabetes mellitus ([place]) - Plan: Renal Function Panel, CBC, Parathyroid hormone, intact (no Ca)  History of prostate cancer - Plan: PSA  Actinic keratoses  Stress due to illness of family member  Vaccination declined  Asymptomatic from a thyroid standpoint.  Blood pressure well controlled.  Check renal function labs and CC to nephrology  PSA collected and CCed to Dr. Jeffie Pollock  Actinic keratoses x5 were treated with cryoablation today.  Home care instructions reviewed with the patient.  Patient declines second shingles vaccination  Orders Placed This Encounter  Procedures   TSH   T4, Free   PSA    Order Specific Question:   CC Results    Answer:   Irine Seal [[id number]]    Renal Function Panel    Order Specific Question:   CC Results    Answer:   Justin Mend [[id number]]   CBC    Order Specific Question:   CC Results    AnswerJustin Mend [[id number]]   Parathyroid hormone, intact (no Ca)    Order Specific Question:   CC Results    AnswerJustin Mend [[id number]]   No orders of the defined types were placed in this encounter.    Janora Norlander, DO [place] [phone]

## 2022-07-24 NOTE — Patient Instructions (Signed)
You had labs performed today.  You will be contacted with the results of the labs once they are available, usually in the next 3 business days for routine lab work.  If you have an active my chart account, they will be released to your MyChart.  If you prefer to have these labs released to you via telephone, please let us know.  Both Dr Jeffie Pollock and Dr Caprice Red labs were obtained and sent to them.  Actinic Keratosis An actinic keratosis is a precancerous growth on the skin. If there is more than one growth, the condition is called actinic keratoses. Actinic keratoses appear most often on areas of skin that get a lot of sun exposure, including the scalp, face, ears, lips, upper back, forearms, and the backs of the hands. If left untreated, these growths may develop into a skin cancer called squamous cell carcinoma. It is important to have all these growths checked by a health care provider to determine the best treatment approach. What are the causes? Actinic keratoses are caused by getting too much ultraviolet (UV) radiation from the sun or other UV light sources. What increases the risk? You are more likely to develop this condition if you: Have light-colored skin and blue eyes. Have blond or red hair. Spend a lot of time in the sun. Do not protect your skin from the sun when outdoors. Are an older person. The risk of developing an actinic keratosis increases with age. What are the signs or symptoms? Actinic keratoses feel like scaly, rough spots of skin. Symptoms of this condition include growths that may: Be as small as a pinhead or as big as a quarter. Itch, hurt, or feel sensitive. Be skin-colored, light tan, dark tan, pink, or a combination of any of these colors. In most cases, the growths become red. Have a small piece of pink or gray skin (skin tag) growing from them. It may be easier to notice actinic keratoses by feeling them, rather than seeing them. Sometimes, actinic keratoses  disappear, but many reappear a few days to a few weeks later. How is this diagnosed? This condition is usually diagnosed with a physical exam. A tissue sample may be removed from the actinic keratosis and examined under a microscope (biopsy). How is this treated? If needed, this condition may be treated by: Scraping off the actinic keratosis (curettage). Freezing the actinic keratosis with liquid nitrogen (cryosurgery). This causes the growth to eventually fall off the skin. Applying medicated creams or gels to destroy the cells in the growth. Applying chemicals to the actinic keratosis to make the outer layers of skin peel off (chemical peel). Using photodynamic therapy. In this procedure, medicated cream is applied to the actinic keratosis. This cream increases your skin's sensitivity to light. Then, a strong light is aimed at the actinic keratosis to destroy cells in the growth. Follow these instructions at home: Skin care Apply cool, wet cloths (cool compresses) to the affected areas. Do not scratch your skin. Check your skin regularly for any growths, especially growths that: Start to itch or bleed. Change in size, shape, or color. Caring for the treated area Keep the treated area clean and dry as told by your health care provider. Do not apply any medicine, cream, or lotion to the treated area unless your health care provider tells you to do that. Do not pick at blisters or try to break them open. This can cause infection and scarring. If you have red or irritated skin after treatment, follow  instructions from your health care provider about how to take care of the treated area. Make sure you: Wash your hands with soap and water before you change your bandage (dressing). If soap and water are not available, use hand sanitizer. Change your dressing as told by your health care provider. If you have red or irritated skin after treatment, check your treated area every day for signs of  infection. Check for: Redness, swelling, or pain. Fluid or blood. Warmth. Pus or a bad smell. General instructions Take or apply over-the-counter and prescription medicines only as told by your health care provider. Return to your normal activities as told by your health care provider. Ask your health care provider what activities are safe for you. Have a skin exam done every year by a health care provider who is a skin specialist (dermatologist). Keep all follow-up visits as told by your health care provider. This is important. Lifestyle Do not use any products that contain nicotine or tobacco, such as cigarettes and e-cigarettes. If you need help quitting, ask your health care provider. Take steps to protect your skin from the sun. Try to avoid the sun between 10:00 a.m. and 4:00 p.m. This is when the UV light is the strongest. Use a sunscreen or sunblock with SPF 30 (sun protection factor 30) or greater. Apply sunscreen before you are exposed to sunlight and reapply as often as directed by the instructions on the sunscreen container. Always wear sunglasses that have UV protection, and always wear a hat and clothing to protect your skin from sunlight. When possible, avoid medicines that increase your sensitivity to sunlight. Do not use tanning beds or other indoor tanning devices. Contact a health care provider if: You notice any changes or new growths on your skin. You have swelling, pain, or more redness around your treated area. You have fluid or blood coming from your treated area. Your treated area feels warm to the touch. You have pus or a bad smell coming from your treated area. You have a fever. You have a blister that becomes large and painful. Summary An actinic keratosis is a precancerous growth on the skin. If there is more than one growth, the condition is called actinic keratoses. In some cases, if left untreated, these growths can develop into skin cancer. Check your  skin regularly for any growths, especially growths that start to itch or bleed, or change in size, shape, or color. Take steps to protect your skin from the sun. Contact a health care provider if you notice any changes or new growths on your skin. Keep all follow-up visits as told by your health care provider. This is important. This information is not intended to replace advice given to you by your health care provider. Make sure you discuss any questions you have with your health care provider. Document Revised: 10/25/2021 Document Reviewed: 10/05/2021 Elsevier Patient Education  [employer] [place].

## 2022-07-25 LAB — RENAL FUNCTION PANEL
Albumin: 4.4 g/dL (ref 3.7–4.7)
BUN/Creatinine Ratio: 16 (ref 10–24)
BUN: 30 mg/dL — ABNORMAL HIGH (ref 8–27)
CO2: 21 mmol/L (ref 20–29)
Calcium: 10.1 mg/dL (ref 8.6–10.2)
Chloride: 108 mmol/L — ABNORMAL HIGH (ref 96–106)
Creatinine, Ser: 1.92 mg/dL — ABNORMAL HIGH (ref 0.76–1.27)
Glucose: 107 mg/dL — ABNORMAL HIGH (ref 70–99)
Phosphorus: 2.7 mg/dL — ABNORMAL LOW (ref 2.8–4.1)
Potassium: 4.8 mmol/L (ref 3.5–5.2)
Sodium: 142 mmol/L (ref 134–144)
eGFR: 34 mL/min/{1.73_m2} — ABNORMAL LOW (ref >59)

## 2022-07-25 LAB — CBC
Hematocrit: 43.3 % (ref 37.5–51.0)
Hemoglobin: 13.4 g/dL (ref 13.0–17.7)
MCH: 28.5 pg (ref 26.6–33.0)
MCHC: 30.9 g/dL — ABNORMAL LOW (ref 31.5–35.7)
MCV: 92 fL (ref 79–97)
Platelets: 340 10*3/uL (ref 150–450)
RBC: 4.71 x10E6/uL (ref 4.14–5.80)
RDW: 12.8 % (ref 11.6–15.4)
WBC: 7 10*3/uL (ref 3.4–10.8)

## 2022-07-25 LAB — PSA: Prostate Specific Ag, Serum: <0.1 ng/mL (ref 0.0–4.0)

## 2022-07-25 LAB — TSH: TSH: 11.5 u[IU]/mL — ABNORMAL HIGH (ref 0.450–4.500)

## 2022-07-25 LAB — T4, FREE: Free T4: 1.18 ng/dL (ref 0.82–1.77)

## 2022-07-25 LAB — PARATHYROID HORMONE, INTACT (NO CA): PTH: 34 pg/mL (ref 15–65)

## 2022-07-25 MED ORDER — LEVOTHYROXINE SODIUM 125 MCG PO TABS: 125.0000 ug | ORAL_TABLET | Freq: Every day | ORAL | 3 refills | Status: DC

## 2022-07-25 NOTE — Addendum Note (Signed)
Addended by: Janora Norlander on: 07/25/2022 11:55 AM   Modules accepted: Orders

## 2022-07-25 NOTE — Addendum Note (Signed)
Addended by: Everlean Cherry on: 07/25/2022 12:05 PM   Modules accepted: Orders

## 2022-07-26 DIAGNOSIS — N184 Chronic kidney disease, stage 4 (severe): Secondary | ICD-10-CM | POA: Diagnosis not present

## 2022-07-26 DIAGNOSIS — N200 Calculus of kidney: Secondary | ICD-10-CM | POA: Diagnosis not present

## 2022-07-26 DIAGNOSIS — D631 Anemia in chronic kidney disease: Secondary | ICD-10-CM | POA: Diagnosis not present

## 2022-07-26 DIAGNOSIS — R809 Proteinuria, unspecified: Secondary | ICD-10-CM | POA: Diagnosis not present

## 2022-07-26 DIAGNOSIS — R609 Edema, unspecified: Secondary | ICD-10-CM | POA: Diagnosis not present

## 2022-07-26 DIAGNOSIS — I129 Hypertensive chronic kidney disease with stage 1 through stage 4 chronic kidney disease, or unspecified chronic kidney disease: Secondary | ICD-10-CM | POA: Diagnosis not present

## 2022-07-28 ENCOUNTER — Other Ambulatory Visit: Payer: Self-pay | Admitting: Family Medicine

## 2022-07-28 DIAGNOSIS — M1A09X0 Idiopathic chronic gout, multiple sites, without tophus (tophi): Secondary | ICD-10-CM

## 2022-08-01 ENCOUNTER — Ambulatory Visit (INDEPENDENT_AMBULATORY_CARE_PROVIDER_SITE_OTHER): Payer: Medicare Other | Admitting: Family Medicine

## 2022-08-01 ENCOUNTER — Encounter: Payer: Self-pay | Admitting: Family Medicine

## 2022-08-01 VITALS — BP 143/72 | HR 85 | Temp 98.0°F | Ht 67.0 in | Wt 215.0 lb

## 2022-08-01 DIAGNOSIS — T8149XA Infection following a procedure, other surgical site, initial encounter: Secondary | ICD-10-CM | POA: Diagnosis not present

## 2022-08-01 MED ORDER — DOXYCYCLINE HYCLATE 100 MG PO TABS: 100.0000 mg | ORAL_TABLET | Freq: Two times a day (BID) | ORAL | 0 refills | Status: AC

## 2022-08-01 MED ORDER — ONDANSETRON 4 MG PO TBDP: 4.0000 mg | ORAL_TABLET | Freq: Three times a day (TID) | ORAL | 0 refills | Status: DC | PRN

## 2022-08-01 MED ORDER — CEFTRIAXONE SODIUM 1 G IJ SOLR
1.0000 g | Freq: Once | INTRAMUSCULAR | Status: AC
  Administered 2022-08-01: 1 g via INTRAMUSCULAR

## 2022-08-01 NOTE — Progress Notes (Signed)
Subjective: CC: Soft tissue infection PCP: Keith Norlander, DO [id number] Keith Wiley is a 86 y.o. male presenting to clinic today for:  1.  Soft tissue infection Patient reports that he has been having some soft tissue swelling, erythema and warmth along the right forearm.  This was one of the lesions that we had performed cryoablation on last week.  The other lesions have had no complications.  He did have some slight drainage but is not sure what color it was.  No fevers.  He has been putting a Band-Aid on it and is not sure if that is what caused some of the erythema and swelling that he has been leaving it out of the bandage today   ROS: Per HPI  Allergies  Allergen Reactions   Augmentin [Amoxicillin-Pot Clavulanate] Diarrhea   Past Medical History:  Diagnosis Date   Back pain    Cancer ([place])    prostrate   Chronic kidney disease    kidney stones and 1 smaller kidney, history of a kidney blockage   Gout    Hyperlipidemia    Hypertension    dr Keith Wiley  pcp   Hypothyroidism    Pneumonia     Current Outpatient Medications:    allopurinol (ZYLOPRIM) 100 MG tablet, TAKE 1 TABLET BY MOUTH DAILY, Disp: 100 tablet, Rfl: 0   amLODipine (NORVASC) 5 MG tablet, Take 1.5 tablets (7.5 mg total) by mouth daily., Disp: 135 tablet, Rfl: 3   Cholecalciferol (VITAMIN D) 50 MCG (2000 UT) tablet, Take 2,000 Units by mouth daily., Disp: , Rfl:    fenofibrate 160 MG tablet, Take 1 tablet (160 mg total) by mouth daily., Disp: 90 tablet, Rfl: 3   fish oil-omega-3 fatty acids 1000 MG capsule, Take 1 g by mouth 2 (two) times daily., Disp: , Rfl:    hydrochlorothiazide (HYDRODIURIL) 25 MG tablet, TAKE 1 TABLET BY MOUTH  DAILY, Disp: 90 tablet, Rfl: 3   hydrocortisone cream 1 %, Apply 1 application topically daily as needed for itching., Disp: , Rfl:    levothyroxine (SYNTHROID) 125 MCG tablet, Take 1 tablet (125 mcg total) by mouth daily., Disp: 90 tablet, Rfl: 3   pravastatin (PRAVACHOL)  80 MG tablet, Take 1 tablet (80 mg total) by mouth daily., Disp: 90 tablet, Rfl: 3   sodium chloride (OCEAN) 0.65 % SOLN nasal spray, Place 1 spray into both nostrils daily., Disp: , Rfl:    valsartan (DIOVAN) 320 MG tablet, Take 1 tablet (320 mg total) by mouth daily., Disp: 90 tablet, Rfl: 3   vitamin C (ASCORBIC ACID) 500 MG tablet, Take 500 mg by mouth daily., Disp: , Rfl:    acetaminophen (TYLENOL) 500 MG tablet, Take 500 mg by mouth every 6 (six) hours as needed for moderate pain. (Patient not taking: Reported on 07/24/2022), Disp: , Rfl:    clotrimazole-betamethasone (LOTRISONE) cream, Apply 1 application topically daily as needed. (Patient not taking: Reported on 07/24/2022), Disp: 30 g, Rfl: 0   fluticasone (FLONASE) 50 MCG/ACT nasal spray, Place 2 sprays into both nostrils daily. 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   Tobacco Use  Smoking status: Former    Packs/day: 1.00    Years: 20.00    Total pack years: 20.00    Types: Cigarettes    Quit date: 04/01/1977    Years since quitting: 45.3   Smokeless tobacco: Never  Vaping Use   Vaping Use: Never used  Substance and Sexual Activity   Alcohol use: No   Drug use: No   Sexual activity: Not on file  Other Topics Concern   Not on file  Social History Narrative   Doristine Devoid grandson lives with them   Daughter lives next door   Social Determinants of Health   Financial Resource Strain: [place]  (01/27/2022)   Overall Financial Resource Strain (CARDIA)    Difficulty of Paying Living Expenses: Not hard at all  Food Insecurity: No Food Insecurity (01/27/2022)   Hunger Vital Sign    Worried About Running Out of Food in the Last Year: Never  true    [place] in the Last Year: Never true  Transportation Needs: No Transportation Needs (01/27/2022)   PRAPARE - Hydrologist (Medical): No    Lack of Transportation (Non-Medical): No  Physical Activity: Insufficiently Active (01/27/2022)   Exercise Vital Sign    Days of Exercise per Week: 7 days    Minutes of Exercise per Session: 20 min  Stress: No Stress Concern Present (01/27/2022)   [place]    Feeling of Stress : Not at all  Social Connections: Socially Integrated (01/27/2022)   Social Connection and Isolation Panel [NHANES]    Frequency of Communication with Friends and Family: More than three times a week    Frequency of Social Gatherings with Friends and Family: More than three times a week    Attends Religious Services: More than 4 times per year    Active Member of [employer] or Organizations: Yes    Attends Music therapist: More than 4 times per year    Marital Status: Married  Human resources officer Violence: Not At Risk (01/27/2022)   Humiliation, Afraid, Rape, and Kick questionnaire    Fear of Current or Ex-Partner: No    Emotionally Abused: No    Physically Abused: No    Sexually Abused: No   Family History  Problem Relation Age of Onset   Heart attack Father    Cancer Brother        unknown origin   Obesity Maternal Grandmother    Cancer Paternal Grandfather        possible -- growth kidney    Anesthesia problems Neg Hx     Objective: Office vital signs reviewed. BP (!) 148/78   Pulse 85   Temp 98 F (36.7 C)   Ht '5\' 7"'$  (1.702 m)   Wt 215 lb (97.5 kg)   SpO2 100%   BMI 33.67 kg/m   Physical Examination:  General: Awake, alert, well nourished, No acute distress Skin: Erythema, warmth.  No induration.  He has some serosanguineous fluid from the lesion on the right dorsal forearm.  Assessment/ Plan: 86 y.o. male   Wound infection following  procedure - Plan: doxycycline (VIBRA-TABS) 100 MG tablet, ondansetron (ZOFRAN-ODT) 4 MG disintegrating tablet, cefTRIAXone (ROCEPHIN) injection 1 g  Rocephin administered today.  May start doxycycline tomorrow twice daily.  Take with food.  Zofran given should he have any GI side effects as he has had some nausea with this medication in the past.  Home care instructions reviewed with the patient and reasons for reevaluation discussed.  No orders of the defined types were placed in this encounter.  No orders of the defined types were placed in this encounter.    Keith Norlander, DO [place] [phone]

## 2022-08-01 NOTE — Patient Instructions (Signed)
Wound Infection A wound infection happens when germs start to grow in a wound. Germs that cause wound infections are most often bacteria. Other types of infections can occur as well. An infection can cause the wound to break open. Wound infections need treatment. If a wound infection is not treated, problems can happen. What are the causes? Most often caused by germs (bacteria) that grow in a wound. Other germs, such as yeast and funguses, can also cause wound infections. What increases the risk? Having a weak body defense system (immune system). Having diabetes. Taking certain medicines (steroids) for a long time. Smoking. Being an older person. Being overweight. Taking certain medicines for cancer treatment. What are the signs or symptoms? Having more redness, swelling, or pain at the wound site. Having more blood or fluid at the wound site. A bad smell coming from a wound or bandage (dressing). Having a fever. Feeling very tired. Having warmth at or around the wound. Having pus at the wound site. How is this treated? This condition is most often treated with an antibiotic medicine. The infection should improve 24-48 hours after you start antibiotics. After 24-48 hours, redness around the wound should stop spreading. The wound should also be less painful. Follow these instructions at home: Medicines Take or apply over-the-counter and prescription medicines only as told by your doctor. If you were prescribed an antibiotic medicine, take or apply it as told by your doctor. Do not stop using the antibiotic even if you start to feel better. Wound care  Clean the wound each day, or as told by your doctor. Wash the wound with mild soap and water. Rinse the wound with water to remove all soap. Pat the wound dry with a clean towel. Do not rub it. Follow instructions from your doctor about how to take care of your wound. Make sure you: Wash your hands with soap and water before and after  you change your bandage. If you cannot use soap and water, use hand sanitizer. Change your bandage as told by your doctor. Leave stitches (sutures), skin glue, or skin tape (adhesive) strips in place if your wound has been closed. They may need to stay in place for 2 weeks or longer. If tape strips get loose and curl up, you may trim the loose edges. Do not remove tape strips completely unless your doctor says it is okay. Some wounds are left open to heal on their own. Check your wound every day for signs of infection. Watch for: More redness, swelling, or pain. More fluid or blood. Warmth. Pus or a bad smell. General instructions Keep the bandage dry until your doctor says it can be removed. Do not take baths, swim, or use a hot tub until your doctor approves. Ask your doctor if you may take showers. You may only be allowed to take sponge baths. Raise (elevate) the injured area above the level of your heart while you are sitting or lying down. Do not scratch or pick at the wound. Keep all follow-up visits as told by your doctor. This is important. Contact a doctor if: Medicine does not help your pain. You have more redness, swelling, or pain around your wound. You have more fluid or blood coming from your wound. Your wound feels warm to the touch. You have pus coming from your wound. You notice a bad smell coming from your wound or your bandage. Your wound that was closed breaks open. Get help right away if: You have a red streak  going away from your wound. You have a fever. Summary A wound infection happens when germs start to grow in a wound. This condition is usually treated with an antibiotic medicine. Follow instructions from your doctor about how to take care of your wound. Contact a doctor if your wound infection does not start to get better in 24-48 hours, or your symptoms get worse. Keep all follow-up visits as told by your doctor. This is important. This information is not  intended to replace advice given to you by your health care provider. Make sure you discuss any questions you have with your health care provider. Document Revised: 10/06/2021 Document Reviewed: 10/06/2021 Elsevier Patient Education  [place].

## 2022-08-06 ENCOUNTER — Other Ambulatory Visit: Payer: Self-pay | Admitting: Family Medicine

## 2022-10-04 ENCOUNTER — Ambulatory Visit (INDEPENDENT_AMBULATORY_CARE_PROVIDER_SITE_OTHER): Payer: Medicare Other

## 2022-10-04 DIAGNOSIS — Z23 Encounter for immunization: Secondary | ICD-10-CM | POA: Diagnosis not present

## 2022-10-28 ENCOUNTER — Other Ambulatory Visit: Payer: Self-pay | Admitting: Family Medicine

## 2022-10-28 DIAGNOSIS — E7800 Pure hypercholesterolemia, unspecified: Secondary | ICD-10-CM

## 2022-10-28 DIAGNOSIS — M1A09X0 Idiopathic chronic gout, multiple sites, without tophus (tophi): Secondary | ICD-10-CM

## 2022-10-28 DIAGNOSIS — E1122 Type 2 diabetes mellitus with diabetic chronic kidney disease: Secondary | ICD-10-CM

## 2022-11-09 ENCOUNTER — Other Ambulatory Visit: Payer: Self-pay | Admitting: Family Medicine

## 2023-01-06 ENCOUNTER — Other Ambulatory Visit: Payer: Self-pay | Admitting: Family Medicine

## 2023-01-06 DIAGNOSIS — N183 Chronic kidney disease, stage 3 unspecified: Secondary | ICD-10-CM

## 2023-01-06 DIAGNOSIS — M1A09X0 Idiopathic chronic gout, multiple sites, without tophus (tophi): Secondary | ICD-10-CM

## 2023-01-06 DIAGNOSIS — E7800 Pure hypercholesterolemia, unspecified: Secondary | ICD-10-CM

## 2023-01-09 DIAGNOSIS — H25813 Combined forms of age-related cataract, bilateral: Secondary | ICD-10-CM | POA: Diagnosis not present

## 2023-01-09 DIAGNOSIS — H353120 Nonexudative age-related macular degeneration, left eye, stage unspecified: Secondary | ICD-10-CM | POA: Diagnosis not present

## 2023-01-24 ENCOUNTER — Ambulatory Visit (INDEPENDENT_AMBULATORY_CARE_PROVIDER_SITE_OTHER): Payer: Medicare Other | Admitting: Family Medicine

## 2023-01-24 ENCOUNTER — Encounter: Payer: Self-pay | Admitting: Family Medicine

## 2023-01-24 VITALS — BP 138/78 | HR 80 | Temp 98.6°F | Ht 67.0 in | Wt 218.0 lb

## 2023-01-24 DIAGNOSIS — E039 Hypothyroidism, unspecified: Secondary | ICD-10-CM | POA: Diagnosis not present

## 2023-01-24 DIAGNOSIS — E7800 Pure hypercholesterolemia, unspecified: Secondary | ICD-10-CM

## 2023-01-24 DIAGNOSIS — H43813 Vitreous degeneration, bilateral: Secondary | ICD-10-CM | POA: Diagnosis not present

## 2023-01-24 DIAGNOSIS — L570 Actinic keratosis: Secondary | ICD-10-CM | POA: Diagnosis not present

## 2023-01-24 DIAGNOSIS — E559 Vitamin D deficiency, unspecified: Secondary | ICD-10-CM | POA: Diagnosis not present

## 2023-01-24 DIAGNOSIS — Z0001 Encounter for general adult medical examination with abnormal findings: Secondary | ICD-10-CM

## 2023-01-24 DIAGNOSIS — M1A09X0 Idiopathic chronic gout, multiple sites, without tophus (tophi): Secondary | ICD-10-CM

## 2023-01-24 DIAGNOSIS — N184 Chronic kidney disease, stage 4 (severe): Secondary | ICD-10-CM | POA: Diagnosis not present

## 2023-01-24 DIAGNOSIS — H35372 Puckering of macula, left eye: Secondary | ICD-10-CM | POA: Diagnosis not present

## 2023-01-24 DIAGNOSIS — I129 Hypertensive chronic kidney disease with stage 1 through stage 4 chronic kidney disease, or unspecified chronic kidney disease: Secondary | ICD-10-CM

## 2023-01-24 DIAGNOSIS — H2512 Age-related nuclear cataract, left eye: Secondary | ICD-10-CM | POA: Diagnosis not present

## 2023-01-24 DIAGNOSIS — Z0000 Encounter for general adult medical examination without abnormal findings: Secondary | ICD-10-CM

## 2023-01-24 DIAGNOSIS — Z125 Encounter for screening for malignant neoplasm of prostate: Secondary | ICD-10-CM

## 2023-01-24 DIAGNOSIS — H3554 Dystrophies primarily involving the retinal pigment epithelium: Secondary | ICD-10-CM | POA: Diagnosis not present

## 2023-01-24 MED ORDER — PRAVASTATIN SODIUM 80 MG PO TABS: 80.0000 mg | ORAL_TABLET | Freq: Every day | ORAL | 3 refills | Status: DC

## 2023-01-24 MED ORDER — AMLODIPINE BESYLATE 5 MG PO TABS: 7.5000 mg | ORAL_TABLET | Freq: Every day | ORAL | 3 refills | Status: DC

## 2023-01-24 MED ORDER — VALSARTAN 320 MG PO TABS: 320.0000 mg | ORAL_TABLET | Freq: Every day | ORAL | 3 refills | Status: DC

## 2023-01-24 MED ORDER — HYDROCHLOROTHIAZIDE 25 MG PO TABS: 25.0000 mg | ORAL_TABLET | Freq: Every day | ORAL | 3 refills | Status: DC

## 2023-01-24 MED ORDER — LEVOTHYROXINE SODIUM 125 MCG PO TABS: 125.0000 ug | ORAL_TABLET | Freq: Every day | ORAL | 3 refills | Status: DC

## 2023-01-24 MED ORDER — ALLOPURINOL 100 MG PO TABS: 100.0000 mg | ORAL_TABLET | Freq: Every day | ORAL | 3 refills | Status: DC

## 2023-01-24 NOTE — Progress Notes (Signed)
Keith Wiley is a 87 y.o. male presents to office today for annual physical exam examination.    Concerns today include: 1.  None.  He reports that he is doing well.  He will be seeing an eye doctor on 01/31/2023 because he had some changes in the left eye that did not seem to be related to cataract formation.  He does report some intermittent knee instability when he is walking around at church.  He does not do any formal exercise, see below  Occupation: Retired, Marital status: Married, Substance use: None Diet: Typical American.  Various degrees of fresh vegetables and water.  Drinks a Coke every day, Exercise: No structured but stays physically active with wood and laundry Last eye exam: UTD Last dental exam: UTD Last colonoscopy: n/a Refills needed today: all Immunizations needed: Immunization History  Administered Date(s) Administered   Fluad Quad(high Dose 65+) 11/04/2019, 12/03/2020, 11/25/2021, 10/04/2022   Influenza, High Dose Seasonal PF 10/10/2016, 09/27/2017, 10/30/2018   Influenza,inj,Quad PF,6+ Mos 10/13/2013, 11/04/2014, 11/09/2015   Janssen (J&J) SARS-COV-2 Vaccination 04/30/2020   Pneumococcal Conjugate-13 10/13/2013   Pneumococcal Polysaccharide-23 10/30/2018   Tdap 08/21/2011   Zoster Recombinat (Shingrix) 01/26/2022   Zoster, Live 08/21/2010     Past Medical History:  Diagnosis Date   Back pain    Cancer ([place])    prostrate   Chronic kidney disease    kidney stones and 1 smaller kidney, history of a kidney blockage   Gout    Hyperlipidemia    Hypertension    dr Elenore Rota moore  pcp   Hypothyroidism    Pneumonia    Social History   Socioeconomic History   Marital status: Married    Spouse name: Jewel   Number of children: 1   Years of education: Not on file   Highest education level: 12th grade  Occupational History   Occupation: retired    Fish farm manager: [employer]    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   Tobacco Use   Smoking status: Former    Packs/day: 1.00     Years: 20.00    Total pack years: 20.00    Types: Cigarettes    Quit date: 04/01/1977    Years since quitting: 45.8   Smokeless tobacco: Never  Vaping Use   Vaping Use: Never used  Substance and Sexual Activity   Alcohol use: No   Drug use: No   Sexual activity: Not on file  Other Topics Concern   Not on file  Social History Narrative   Doristine Devoid grandson lives with them   Daughter lives next door   Social Determinants of Health   Financial Resource Strain: [place]  (01/27/2022)   Overall Financial Resource Strain (CARDIA)    Difficulty of Paying Living Expenses: Not hard at all  Food Insecurity: No Food Insecurity (01/27/2022)   Hunger Vital Sign    Worried About Running Out of Food in the Last Year: Never true    [place] in the Last Year: Never true  Transportation Needs: No Transportation Needs (01/27/2022)   PRAPARE - Hydrologist (Medical): No    Lack of Transportation (Non-Medical): No  Physical Activity: Insufficiently Active (01/27/2022)   Exercise Vital Sign    Days of Exercise per Week: 7 days    Minutes of Exercise per Session: 20 min  Stress: No Stress Concern Present (01/27/2022)   [place]    Feeling of  Stress : Not at all  Social Connections: Socially Integrated (01/27/2022)   Social Connection and Isolation Panel [NHANES]    Frequency of Communication with Friends and Family: More than three times a week    Frequency of Social Gatherings with Friends and Family: More than three times a week    Attends Religious Services: More than 4 times per year    Active Member of [employer] or Organizations: Yes    Attends Music therapist: More than 4 times per year    Marital Status: Married  Human resources officer Violence: Not At Risk (01/27/2022)   Humiliation, Afraid, Rape, and Kick questionnaire    Fear of Current or Ex-Partner: No    Emotionally Abused: No    Physically  Abused: No    Sexually Abused: No   Past Surgical History:  Procedure Laterality Date   BACK SURGERY     cervical   1989, 2011   [place] LAMINECTOMY/DECOMPRESSION MICRODISCECTOMY N/A 08/23/2021   Procedure: Lumbar one-tow, Lumbar two-three, Lumbar three-four, Lumbar four-five, Lumbar five-Sacral one Laminectomy and foraminotomy;  Surgeon: Kristeen Miss, MD;  Location: [facility];  Service: Neurosurgery;  Laterality: N/A;  3C/RM 20   prostrate     seeds inplant   2009   Family History  Problem Relation Age of Onset   Heart attack Father    Cancer Brother        unknown origin   Obesity Maternal Grandmother    Cancer Paternal Grandfather        possible -- growth kidney    Anesthesia problems Neg Hx     Current Outpatient Medications:    acetaminophen (TYLENOL) 500 MG tablet, Take 500 mg by mouth every 6 (six) hours as needed for moderate pain. (Patient not taking: Reported on 07/24/2022), Disp: , Rfl:    allopurinol (ZYLOPRIM) 100 MG tablet, TAKE 1 TABLET BY MOUTH DAILY, Disp: 100 tablet, Rfl: 0   amLODipine (NORVASC) 5 MG tablet, TAKE 1 AND 1/2 TABLETS BY MOUTH  DAILY, Disp: 150 tablet, Rfl: 0   Cholecalciferol (VITAMIN D) 50 MCG (2000 UT) tablet, Take 2,000 Units by mouth daily., Disp: , Rfl:    clotrimazole-betamethasone (LOTRISONE) cream, Apply 1 application topically daily as needed. (Patient not taking: Reported on 07/24/2022), Disp: 30 g, Rfl: 0   fenofibrate 160 MG tablet, TAKE 1 TABLET BY MOUTH DAILY, Disp: 100 tablet, Rfl: 0   fish oil-omega-3 fatty acids 1000 MG capsule, Take 1 g by mouth 2 (two) times daily., Disp: , Rfl:    fluticasone (FLONASE) 50 MCG/ACT nasal spray, Place 2 sprays into both nostrils daily. (Patient not taking: Reported on 07/24/2022), Disp: 16 g, Rfl: 6   hydrochlorothiazide (HYDRODIURIL) 25 MG tablet, TAKE 1 TABLET BY MOUTH DAILY, Disp: 100 tablet, Rfl: 1   hydrocortisone cream 1 %, Apply 1  application topically daily as needed for itching., Disp: , Rfl:    levothyroxine (SYNTHROID) 125 MCG tablet, Take 1 tablet (125 mcg total) by mouth daily., Disp: 90 tablet, Rfl: 3   ondansetron (ZOFRAN-ODT) 4 MG disintegrating tablet, Take 1 tablet (4 mg total) by mouth every 8 (eight) hours as needed for nausea or vomiting., Disp: 20 tablet, Rfl: 0   pravastatin (PRAVACHOL) 80 MG tablet, TAKE 1 TABLET BY MOUTH DAILY, Disp: 100 tablet, Rfl: 0   sodium chloride (OCEAN) 0.65 % SOLN nasal spray, Place 1 spray into both nostrils  daily., Disp: , Rfl:    valsartan (DIOVAN) 320 MG tablet, TAKE 1 TABLET BY MOUTH DAILY, Disp: 100 tablet, Rfl: 0   vitamin C (ASCORBIC ACID) 500 MG tablet, Take 500 mg by mouth daily., Disp: , Rfl:   Allergies  Allergen Reactions   Augmentin [Amoxicillin-Pot Clavulanate] Diarrhea     ROS: Review of Systems A comprehensive review of systems was negative except for: Eyes: positive for left vision changes. Seeing ophthalmology 2/7 Musculoskeletal: positive for bilateral knee instability    Physical exam BP 138/78   Pulse 80   Temp 98.6 F (37 C)   Ht '5\' 7"'$  (1.702 m)   Wt 218 lb (98.9 kg)   SpO2 95%   BMI 34.14 kg/m  General appearance: alert, cooperative, appears stated age, no distress, and mildly obese Head: Normocephalic, without obvious abnormality, atraumatic Eyes: negative findings: lids and lashes normal, conjunctivae and sclerae normal, corneas clear, and pupils equal, round, reactive to light and accomodation Ears: normal TM's and external ear canals both ears Nose: Nares normal. Septum midline. Mucosa normal. No drainage or sinus tenderness. Throat: lips, mucosa, and tongue normal; teeth and gums normal Neck: no adenopathy, supple, symmetrical, trachea midline, and thyroid not enlarged, symmetric, no tenderness/mass/nodules Back: symmetric, no curvature. ROM normal. No CVA tenderness. Lungs: clear to auscultation bilaterally Chest wall: no  tenderness Heart: regular rate and rhythm, S1, S2 normal, no murmur, click, rub or gallop Abdomen: soft, non-tender; bowel sounds normal; no masses,  no organomegaly Extremities: extremities normal, atraumatic, no cyanosis or edema Pulses: 2+ and symmetric Skin:  Several actinic keratosis noted along bilateral upper extremities Lymph nodes: Cervical, supraclavicular, and axillary nodes normal. Neurologic: Alert and oriented X 3, normal strength and tone. Normal symmetric reflexes. Normal coordination and gait Psych: Mood stable, speech normal, affect appropriate   Assessment/ Plan: [name] here for annual physical exam.   Annual physical exam  Acquired hypothyroidism - Plan: levothyroxine (SYNTHROID) 125 MCG tablet, T4, Free  Pure hypercholesterolemia - Plan: pravastatin (PRAVACHOL) 80 MG tablet, Lipid panel, TSH  Benign hypertension with chronic kidney disease, stage IV (HCC) - Plan: valsartan (DIOVAN) 320 MG tablet, CBC with Differential/Platelet, Comprehensive metabolic panel  Vitamin D deficiency  Idiopathic chronic gout of multiple sites without tophus - Plan: allopurinol (ZYLOPRIM) 100 MG tablet  Screening for malignant neoplasm of prostate - Plan: PSA  Actinic keratoses  Declined COVID vaccination, tetanus and second shingles vaccination.  Fasting labs were collected today.  Medications have been renewed for the patient.  He is under the care of nephrology for CKD 4.  Check renal function, platelet level  Allopurinol renewed for gout associated with CKD  PSA collected.  Patient asymptomatic from a prostate standpoint  Plan for cryoablation of actinic keratoses at next visit  Counseled on healthy lifestyle choices, including diet (rich in fruits, vegetables and lean meats and low in salt and simple carbohydrates) and exercise (at least 30 minutes of moderate physical activity daily).  Patient to follow up in 11mfor BP or renal function.  Kingsly Kloepfer M. GLajuana Ripple  DO

## 2023-01-24 NOTE — Patient Instructions (Signed)
Preventive Care 65 Years and Older, Male Preventive care refers to lifestyle choices and visits with your health care provider that can promote health and wellness. Preventive care visits are also called wellness exams. What can I expect for my preventive care visit? Counseling During your preventive care visit, your health care provider may ask about your: Medical history, including: Past medical problems. Family medical history. History of falls. Current health, including: Emotional well-being. Home life and relationship well-being. Sexual activity. Memory and ability to understand (cognition). Lifestyle, including: Alcohol, nicotine or tobacco, and drug use. Access to firearms. Diet, exercise, and sleep habits. Work and work environment. Sunscreen use. Safety issues such as seatbelt and bike helmet use. Physical exam Your health care provider will check your: Height and weight. These may be used to calculate your BMI (body mass index). BMI is a measurement that tells if you are at a healthy weight. Waist circumference. This measures the distance around your waistline. This measurement also tells if you are at a healthy weight and may help predict your risk of certain diseases, such as type 2 diabetes and high blood pressure. Heart rate and blood pressure. Body temperature. Skin for abnormal spots. What immunizations do I need?  Vaccines are usually given at various ages, according to a schedule. Your health care provider will recommend vaccines for you based on your age, medical history, and lifestyle or other factors, such as travel or where you work. What tests do I need? Screening Your health care provider may recommend screening tests for certain conditions. This may include: Lipid and cholesterol levels. Diabetes screening. This is done by checking your blood sugar (glucose) after you have not eaten for a while (fasting). Hepatitis C test. Hepatitis B test. HIV (human  immunodeficiency virus) test. STI (sexually transmitted infection) testing, if you are at risk. Lung cancer screening. Colorectal cancer screening. Prostate cancer screening. Abdominal aortic aneurysm (AAA) screening. You may need this if you are a current or former smoker. Talk with your health care provider about your test results, treatment options, and if necessary, the need for more tests. Follow these instructions at home: Eating and drinking  Eat a diet that includes fresh fruits and vegetables, whole grains, lean protein, and low-fat dairy products. Limit your intake of foods with high amounts of sugar, saturated fats, and salt. Take vitamin and mineral supplements as recommended by your health care provider. Do not drink alcohol if your health care provider tells you not to drink. If you drink alcohol: Limit how much you have to 0-2 drinks a day. Know how much alcohol is in your drink. In the [place] one drink equals one 12 oz bottle of beer (355 mL), one 5 oz glass of wine (148 mL), or one 1 oz glass of hard liquor (44 mL). Lifestyle Brush your teeth every morning and night with fluoride toothpaste. Floss one time each day. Exercise for at least 30 minutes 5 or more days each week. Do not use any products that contain nicotine or tobacco. These products include cigarettes, chewing tobacco, and vaping devices, such as e-cigarettes. If you need help quitting, ask your health care provider. Do not use drugs. If you are sexually active, practice safe sex. Use a condom or other form of protection to prevent STIs. Take aspirin only as told by your health care provider. Make sure that you understand how much to take and what form to take. Work with your health care provider to find out whether it is safe   and beneficial for you to take aspirin daily. Ask your health care provider if you need to take a cholesterol-lowering medicine (statin). Find healthy ways to manage stress, such  as: Meditation, yoga, or listening to music. Journaling. Talking to a trusted person. Spending time with friends and family. Safety Always wear your seat belt while driving or riding in a vehicle. Do not drive: If you have been drinking alcohol. Do not ride with someone who has been drinking. When you are tired or distracted. While texting. If you have been using any mind-altering substances or drugs. Wear a helmet and other protective equipment during sports activities. If you have firearms in your house, make sure you follow all gun safety procedures. Minimize exposure to UV radiation to reduce your risk of skin cancer. What's next? Visit your health care provider once a year for an annual wellness visit. Ask your health care provider how often you should have your eyes and teeth checked. Stay up to date on all vaccines. This information is not intended to replace advice given to you by your health care provider. Make sure you discuss any questions you have with your health care provider. Document Revised: 06/08/2021 Document Reviewed: 06/08/2021 Elsevier Patient Education  2023 Elsevier Inc.  

## 2023-01-25 LAB — CBC WITH DIFFERENTIAL/PLATELET
Basophils Absolute: 0.1 10*3/uL (ref 0.0–0.2)
Basos: 1 %
EOS (ABSOLUTE): 0.3 10*3/uL (ref 0.0–0.4)
Eos: 3 %
Hematocrit: 38.8 % (ref 37.5–51.0)
Hemoglobin: 12.9 g/dL — ABNORMAL LOW (ref 13.0–17.7)
Immature Grans (Abs): 0 10*3/uL (ref 0.0–0.1)
Immature Granulocytes: 0 %
Lymphocytes Absolute: 2.4 10*3/uL (ref 0.7–3.1)
Lymphs: 29 %
MCH: 29.7 pg (ref 26.6–33.0)
MCHC: 33.2 g/dL (ref 31.5–35.7)
MCV: 89 fL (ref 79–97)
Monocytes Absolute: 0.8 10*3/uL (ref 0.1–0.9)
Monocytes: 10 %
Neutrophils Absolute: 4.8 10*3/uL (ref 1.4–7.0)
Neutrophils: 57 %
Platelets: 327 10*3/uL (ref 150–450)
RBC: 4.34 x10E6/uL (ref 4.14–5.80)
RDW: 11.9 % (ref 11.6–15.4)
WBC: 8.4 10*3/uL (ref 3.4–10.8)

## 2023-01-25 LAB — COMPREHENSIVE METABOLIC PANEL
ALT: 21 IU/L (ref 0–44)
AST: 28 IU/L (ref 0–40)
Albumin/Globulin Ratio: 1.9 (ref 1.2–2.2)
Albumin: 4.5 g/dL (ref 3.7–4.7)
Alkaline Phosphatase: 49 IU/L (ref 44–121)
BUN/Creatinine Ratio: 17 (ref 10–24)
BUN: 32 mg/dL — ABNORMAL HIGH (ref 8–27)
Bilirubin Total: 0.5 mg/dL (ref 0.0–1.2)
CO2: 18 mmol/L — ABNORMAL LOW (ref 20–29)
Calcium: 9.5 mg/dL (ref 8.6–10.2)
Chloride: 106 mmol/L (ref 96–106)
Creatinine, Ser: 1.92 mg/dL — ABNORMAL HIGH (ref 0.76–1.27)
Globulin, Total: 2.4 g/dL (ref 1.5–4.5)
Glucose: 101 mg/dL — ABNORMAL HIGH (ref 70–99)
Potassium: 4.3 mmol/L (ref 3.5–5.2)
Sodium: 141 mmol/L (ref 134–144)
Total Protein: 6.9 g/dL (ref 6.0–8.5)
eGFR: 34 mL/min/{1.73_m2} — ABNORMAL LOW (ref >59)

## 2023-01-25 LAB — LIPID PANEL
Chol/HDL Ratio: 2.9 ratio (ref 0.0–5.0)
Cholesterol, Total: 123 mg/dL (ref 100–199)
HDL: 43 mg/dL (ref >39)
LDL Chol Calc (NIH): 61 mg/dL (ref 0–99)
Triglycerides: 100 mg/dL (ref 0–149)
VLDL Cholesterol Cal: 19 mg/dL (ref 5–40)

## 2023-01-25 LAB — PSA: Prostate Specific Ag, Serum: <0.1 ng/mL (ref 0.0–4.0)

## 2023-01-25 LAB — TSH: TSH: 2.38 u[IU]/mL (ref 0.450–4.500)

## 2023-01-26 LAB — T4, FREE: Free T4: 1.66 ng/dL (ref 0.82–1.77)

## 2023-01-26 LAB — SPECIMEN STATUS REPORT

## 2023-02-12 DIAGNOSIS — H25812 Combined forms of age-related cataract, left eye: Secondary | ICD-10-CM | POA: Diagnosis not present

## 2023-02-12 DIAGNOSIS — H35372 Puckering of macula, left eye: Secondary | ICD-10-CM | POA: Diagnosis not present

## 2023-02-20 ENCOUNTER — Telehealth: Payer: Self-pay | Admitting: Family Medicine

## 2023-02-20 NOTE — Telephone Encounter (Signed)
[place] to schedule their annual wellness visit. Patient declined to schedule AWV at this time.  Declined with Gillermina Phy, CYNTHIA A   Thank you,  Colletta [place],  AMB Clinical Support [facility] AWV Program Direct Dial ??[id number]

## 2023-02-22 DIAGNOSIS — I129 Hypertensive chronic kidney disease with stage 1 through stage 4 chronic kidney disease, or unspecified chronic kidney disease: Secondary | ICD-10-CM | POA: Diagnosis not present

## 2023-02-22 DIAGNOSIS — D631 Anemia in chronic kidney disease: Secondary | ICD-10-CM | POA: Diagnosis not present

## 2023-02-22 DIAGNOSIS — N184 Chronic kidney disease, stage 4 (severe): Secondary | ICD-10-CM | POA: Diagnosis not present

## 2023-02-22 DIAGNOSIS — R609 Edema, unspecified: Secondary | ICD-10-CM | POA: Diagnosis not present

## 2023-02-22 DIAGNOSIS — R809 Proteinuria, unspecified: Secondary | ICD-10-CM | POA: Diagnosis not present

## 2023-02-22 DIAGNOSIS — N200 Calculus of kidney: Secondary | ICD-10-CM | POA: Diagnosis not present

## 2023-02-23 DIAGNOSIS — H25812 Combined forms of age-related cataract, left eye: Secondary | ICD-10-CM | POA: Diagnosis not present

## 2023-02-23 DIAGNOSIS — H35372 Puckering of macula, left eye: Secondary | ICD-10-CM | POA: Diagnosis not present

## 2023-03-09 ENCOUNTER — Telehealth (INDEPENDENT_AMBULATORY_CARE_PROVIDER_SITE_OTHER): Payer: Medicare Other | Admitting: Family Medicine

## 2023-03-09 ENCOUNTER — Encounter: Payer: Self-pay | Admitting: Family Medicine

## 2023-03-09 ENCOUNTER — Telehealth: Payer: Self-pay | Admitting: Family Medicine

## 2023-03-09 DIAGNOSIS — U071 COVID-19: Secondary | ICD-10-CM | POA: Diagnosis not present

## 2023-03-09 MED ORDER — ONDANSETRON 4 MG PO TBDP: 4.0000 mg | ORAL_TABLET | Freq: Three times a day (TID) | ORAL | 0 refills | Status: DC | PRN

## 2023-03-09 MED ORDER — MOLNUPIRAVIR EUA 200MG CAPSULE: 4.0000 | ORAL_CAPSULE | Freq: Two times a day (BID) | ORAL | 0 refills | Status: DC

## 2023-03-09 MED ORDER — NIRMATRELVIR/RITONAVIR (PAXLOVID) TABLET (RENAL DOSING): 2.0000 | ORAL_TABLET | Freq: Two times a day (BID) | ORAL | 0 refills | Status: AC

## 2023-03-09 NOTE — Telephone Encounter (Signed)
He can try this 1 other medicine that I can send in for COVID called Paxlovid but he will have to stop his cholesterol medicines while he is on it.

## 2023-03-09 NOTE — Telephone Encounter (Signed)
Pt has been informed. He would like for the Paxlovid to be sent in. He is aware to d/c his chlolesterol medications.  [facility] has sent in [place].

## 2023-03-09 NOTE — Telephone Encounter (Signed)
Forwarding to Dr Warrick Parisian, who saw pt today

## 2023-03-09 NOTE — Telephone Encounter (Signed)
Patient calling because he is not able to afford the medication that was called in to treat COVID. He stated that even after the insurance, it was too expensive.   molnupiravir EUA (LAGEVRIO) 200 mg CAPS capsule ondansetron (ZOFRAN-ODT) 4 MG disintegrating tablet

## 2023-03-09 NOTE — Progress Notes (Signed)
Virtual Visit via Mychart video Note  I connected with Keith Wiley on 03/09/23 at 1331 by video and verified that I am speaking with the correct person using two identifiers. Keith Wiley is currently located at home and patient are currently with her during visit. The provider, Fransisca Kaufmann Rifky Lapre, MD is located in their office at time of visit.  Call ended at 1342  I discussed the limitations, risks, security and privacy concerns of performing an evaluation and management service by video and the availability of in person appointments. I also discussed with the patient that there may be a patient responsible charge related to this service. The patient expressed understanding and agreed to proceed.   History and Present Illness: Patient is calling in for cough and congestion and achiness and tested positive for covid for yesterday.  He is still having cough and congestion and headaches and nasal drainage.  He has chest congestion. He thinks maybe low grade fever. He denies SOB but has wheezing. He took alka seltzer cold. He has flonase as well.   1. COVID-19 virus infection     Outpatient Encounter Medications as of 03/09/2023  Medication Sig   molnupiravir EUA (LAGEVRIO) 200 mg CAPS capsule Take 4 capsules (800 mg total) by mouth 2 (two) times daily for 5 days.   acetaminophen (TYLENOL) 500 MG tablet Take 500 mg by mouth every 6 (six) hours as needed for moderate pain.   allopurinol (ZYLOPRIM) 100 MG tablet Take 1 tablet (100 mg total) by mouth daily.   amLODipine (NORVASC) 5 MG tablet Take 1.5 tablets (7.5 mg total) by mouth daily.   Cholecalciferol (VITAMIN D) 50 MCG (2000 UT) tablet Take 2,000 Units by mouth daily.   clotrimazole-betamethasone (LOTRISONE) cream Apply 1 application topically daily as needed.   fenofibrate 160 MG tablet TAKE 1 TABLET BY MOUTH DAILY   fish oil-omega-3 fatty acids 1000 MG capsule Take 1 g by mouth 2 (two) times daily.   fluticasone (FLONASE) 50  MCG/ACT nasal spray Place 2 sprays into both nostrils daily.   hydrochlorothiazide (HYDRODIURIL) 25 MG tablet Take 1 tablet (25 mg total) by mouth daily.   hydrocortisone cream 1 % Apply 1 application topically daily as needed for itching.   levothyroxine (SYNTHROID) 125 MCG tablet Take 1 tablet (125 mcg total) by mouth daily.   ondansetron (ZOFRAN-ODT) 4 MG disintegrating tablet Take 1 tablet (4 mg total) by mouth every 8 (eight) hours as needed for nausea or vomiting.   pravastatin (PRAVACHOL) 80 MG tablet Take 1 tablet (80 mg total) by mouth daily.   sodium chloride (OCEAN) 0.65 % SOLN nasal spray Place 1 spray into both nostrils daily.   valsartan (DIOVAN) 320 MG tablet Take 1 tablet (320 mg total) by mouth daily.   vitamin C (ASCORBIC ACID) 500 MG tablet Take 500 mg by mouth daily.   [DISCONTINUED] ondansetron (ZOFRAN-ODT) 4 MG disintegrating tablet Take 1 tablet (4 mg total) by mouth every 8 (eight) hours as needed for nausea or vomiting. (Patient not taking: Reported on 01/24/2023)   No facility-administered encounter medications on file as of 03/09/2023.    Review of Systems  Constitutional:  Positive for fever. Negative for chills.  HENT:  Positive for congestion, postnasal drip, rhinorrhea, sinus pressure, sneezing and sore throat. Negative for ear discharge, ear pain and voice change.   Eyes:  Negative for pain, discharge, redness and visual disturbance.  Respiratory:  Positive for cough. Negative for shortness of breath and wheezing.  Cardiovascular:  Negative for chest pain and leg swelling.  Musculoskeletal:  Positive for myalgias. Negative for back pain and gait problem.  Skin:  Negative for rash.  All other systems reviewed and are negative.   Observations/Objective: Patient sounds comfortable and in no acute distress  Assessment and Plan: Problem List Items Addressed This Visit   None Visit Diagnoses     COVID-19 virus infection    -  Primary   Relevant Medications    molnupiravir EUA (LAGEVRIO) 200 mg CAPS capsule   ondansetron (ZOFRAN-ODT) 4 MG disintegrating tablet       Patient tested positive for COVID at home, will send the molnupiravir because he has lower renal function and on multiple other medicines that could interact with Paxlovid.  He says he gets nauseated with some medicine so I sent in some Zofran which she has had previously. Follow up plan: Return if symptoms worsen or fail to improve.     I discussed the assessment and treatment plan with the patient. The patient was provided an opportunity to ask questions and all were answered. The patient agreed with the plan and demonstrated an understanding of the instructions.   The patient was advised to call back or seek an in-person evaluation if the symptoms worsen or if the condition fails to improve as anticipated.  The above assessment and management plan was discussed with the patient. The patient verbalized understanding of and has agreed to the management plan. Patient is aware to call the clinic if symptoms persist or worsen. Patient is aware when to return to the clinic for a follow-up visit. Patient educated on when it is appropriate to go to the emergency department.    I provided 11 minutes of non-face-to-face time during this encounter.    Worthy Rancher, MD

## 2023-03-17 ENCOUNTER — Other Ambulatory Visit: Payer: Self-pay | Admitting: Family Medicine

## 2023-03-17 DIAGNOSIS — E7800 Pure hypercholesterolemia, unspecified: Secondary | ICD-10-CM

## 2023-03-17 DIAGNOSIS — M1A09X0 Idiopathic chronic gout, multiple sites, without tophus (tophi): Secondary | ICD-10-CM

## 2023-03-17 DIAGNOSIS — I129 Hypertensive chronic kidney disease with stage 1 through stage 4 chronic kidney disease, or unspecified chronic kidney disease: Secondary | ICD-10-CM

## 2023-03-30 DIAGNOSIS — H35372 Puckering of macula, left eye: Secondary | ICD-10-CM | POA: Diagnosis not present

## 2023-04-20 ENCOUNTER — Other Ambulatory Visit: Payer: Self-pay | Admitting: Family Medicine

## 2023-05-10 ENCOUNTER — Other Ambulatory Visit: Payer: Self-pay | Admitting: Family Medicine

## 2023-06-08 ENCOUNTER — Other Ambulatory Visit: Payer: Self-pay | Admitting: Family Medicine

## 2023-06-08 DIAGNOSIS — E039 Hypothyroidism, unspecified: Secondary | ICD-10-CM

## 2023-06-29 DIAGNOSIS — T1502XA Foreign body in cornea, left eye, initial encounter: Secondary | ICD-10-CM | POA: Diagnosis not present

## 2023-06-29 DIAGNOSIS — H3554 Dystrophies primarily involving the retinal pigment epithelium: Secondary | ICD-10-CM | POA: Diagnosis not present

## 2023-06-29 DIAGNOSIS — H43813 Vitreous degeneration, bilateral: Secondary | ICD-10-CM | POA: Diagnosis not present

## 2023-07-03 DIAGNOSIS — H25811 Combined forms of age-related cataract, right eye: Secondary | ICD-10-CM | POA: Diagnosis not present

## 2023-07-03 DIAGNOSIS — H35372 Puckering of macula, left eye: Secondary | ICD-10-CM | POA: Diagnosis not present

## 2023-07-03 DIAGNOSIS — Z961 Presence of intraocular lens: Secondary | ICD-10-CM | POA: Diagnosis not present

## 2023-07-15 ENCOUNTER — Other Ambulatory Visit: Payer: Self-pay | Admitting: Family Medicine

## 2023-07-27 ENCOUNTER — Encounter: Payer: Self-pay | Admitting: Family Medicine

## 2023-07-27 ENCOUNTER — Ambulatory Visit: Payer: Medicare Other | Admitting: Family Medicine

## 2023-07-27 VITALS — BP 131/74 | HR 81 | Temp 99.4°F | Ht 67.0 in | Wt 212.2 lb

## 2023-07-27 DIAGNOSIS — M1A09X0 Idiopathic chronic gout, multiple sites, without tophus (tophi): Secondary | ICD-10-CM | POA: Diagnosis not present

## 2023-07-27 DIAGNOSIS — E039 Hypothyroidism, unspecified: Secondary | ICD-10-CM

## 2023-07-27 DIAGNOSIS — N183 Chronic kidney disease, stage 3 unspecified: Secondary | ICD-10-CM | POA: Diagnosis not present

## 2023-07-27 DIAGNOSIS — J301 Allergic rhinitis due to pollen: Secondary | ICD-10-CM

## 2023-07-27 DIAGNOSIS — E1122 Type 2 diabetes mellitus with diabetic chronic kidney disease: Secondary | ICD-10-CM

## 2023-07-27 DIAGNOSIS — I129 Hypertensive chronic kidney disease with stage 1 through stage 4 chronic kidney disease, or unspecified chronic kidney disease: Secondary | ICD-10-CM

## 2023-07-27 DIAGNOSIS — L570 Actinic keratosis: Secondary | ICD-10-CM

## 2023-07-27 DIAGNOSIS — E7800 Pure hypercholesterolemia, unspecified: Secondary | ICD-10-CM

## 2023-07-27 DIAGNOSIS — Z8546 Personal history of malignant neoplasm of prostate: Secondary | ICD-10-CM

## 2023-07-27 DIAGNOSIS — D485 Neoplasm of uncertain behavior of skin: Secondary | ICD-10-CM

## 2023-07-27 LAB — CMP14+EGFR
ALT: 14 IU/L (ref 0–44)
AST: 21 IU/L (ref 0–40)
Albumin: 4.1 g/dL (ref 3.7–4.7)
Alkaline Phosphatase: 58 IU/L (ref 44–121)
BUN/Creatinine Ratio: 16 (ref 10–24)
BUN: 35 mg/dL — ABNORMAL HIGH (ref 8–27)
Bilirubin Total: 0.4 mg/dL (ref 0.0–1.2)
CO2: 19 mmol/L — ABNORMAL LOW (ref 20–29)
Calcium: 9.9 mg/dL (ref 8.6–10.2)
Chloride: 104 mmol/L (ref 96–106)
Creatinine, Ser: 2.15 mg/dL — ABNORMAL HIGH (ref 0.76–1.27)
Globulin, Total: 2.7 g/dL (ref 1.5–4.5)
Glucose: 104 mg/dL — ABNORMAL HIGH (ref 70–99)
Potassium: 4.2 mmol/L (ref 3.5–5.2)
Sodium: 139 mmol/L (ref 134–144)
Total Protein: 6.8 g/dL (ref 6.0–8.5)
eGFR: 29 mL/min/{1.73_m2} — ABNORMAL LOW (ref >59)

## 2023-07-27 LAB — CBC WITH DIFFERENTIAL/PLATELET
Basophils Absolute: 0.1 10*3/uL (ref 0.0–0.2)
Basos: 1 %
EOS (ABSOLUTE): 0.3 10*3/uL (ref 0.0–0.4)
Eos: 4 %
Hematocrit: 38.9 % (ref 37.5–51.0)
Hemoglobin: 13 g/dL (ref 13.0–17.7)
Immature Grans (Abs): 0 10*3/uL (ref 0.0–0.1)
Immature Granulocytes: 0 %
Lymphocytes Absolute: 2.3 10*3/uL (ref 0.7–3.1)
Lymphs: 31 %
MCH: 30 pg (ref 26.6–33.0)
MCHC: 33.4 g/dL (ref 31.5–35.7)
MCV: 90 fL (ref 79–97)
Monocytes Absolute: 0.7 10*3/uL (ref 0.1–0.9)
Monocytes: 9 %
Neutrophils Absolute: 4 10*3/uL (ref 1.4–7.0)
Neutrophils: 55 %
Platelets: 292 10*3/uL (ref 150–450)
RBC: 4.33 x10E6/uL (ref 4.14–5.80)
RDW: 12.3 % (ref 11.6–15.4)
WBC: 7.3 10*3/uL (ref 3.4–10.8)

## 2023-07-27 LAB — VITAMIN D 25 HYDROXY (VIT D DEFICIENCY, FRACTURES)

## 2023-07-27 LAB — PSA, TOTAL AND FREE

## 2023-07-27 LAB — T4, FREE

## 2023-07-27 LAB — TSH

## 2023-07-27 MED ORDER — ALLOPURINOL 100 MG PO TABS
100.0000 mg | ORAL_TABLET | Freq: Every day | ORAL | 1 refills | Status: DC
Start: 2023-07-27 — End: 2024-02-06

## 2023-07-27 MED ORDER — FLUTICASONE PROPIONATE 50 MCG/ACT NA SUSP
2.0000 | Freq: Every day | NASAL | 6 refills | Status: AC
Start: 2023-07-27 — End: ?

## 2023-07-27 MED ORDER — HYDROCHLOROTHIAZIDE 25 MG PO TABS
25.0000 mg | ORAL_TABLET | Freq: Every day | ORAL | 3 refills | Status: DC
Start: 2023-07-27 — End: 2024-02-06

## 2023-07-27 MED ORDER — PRAVASTATIN SODIUM 80 MG PO TABS
80.0000 mg | ORAL_TABLET | Freq: Every day | ORAL | 1 refills | Status: DC
Start: 2023-07-27 — End: 2024-02-06

## 2023-07-27 MED ORDER — VALSARTAN 320 MG PO TABS
320.0000 mg | ORAL_TABLET | Freq: Every day | ORAL | 1 refills | Status: DC
Start: 2023-07-27 — End: 2024-02-06

## 2023-07-27 MED ORDER — FENOFIBRATE 160 MG PO TABS
160.0000 mg | ORAL_TABLET | Freq: Every day | ORAL | 1 refills | Status: DC
Start: 2023-07-27 — End: 2024-02-06

## 2023-07-27 NOTE — Patient Instructions (Signed)
Actinic Keratosis An actinic keratosis is a precancerous growth on the skin. If there is more than one, the condition is called actinic keratoses. These growths appear most often on parts of the skin that get a lot of sun exposure, including the: Scalp. Face. Ears. Lips. Upper back. Forearms. Backs of the hands. If left untreated, these growths may develop into a skin cancer called squamous cell carcinoma. It is important to have all these growths checked by a health care provider to determine the best treatment. What are the causes? Actinic keratoses are caused by getting too much ultraviolet (UV) radiation from the sun or other UV light sources. What increases the risk? You are more likely to develop this condition if you: Have light-colored skin or blue eyes. Have blond or red hair. Spend a lot of time in the sun. Do not protect your skin from the sun when outdoors. Are an older person. The risk of developing an actinic keratosis increases with age. What are the signs or symptoms? These growths feel like scaly, rough spots of skin. Symptoms of this condition include growths that may: Be as small as a pinhead or as big as a quarter. Itch, hurt, or feel sensitive. Be skin-colored, light tan, dark tan, pink, or a combination of these colors. In most cases, the growths become red. Have a small piece of pink or gray skin (skin tag) growing from them. It may be easier to notice the growths by feeling them rather than seeing them. Sometimes, actinic keratoses disappear but may return a few days to a few weeks later. How is this diagnosed? This condition is usually diagnosed with a physical exam. A tissue sample may be removed from the growth and examined under a microscope (biopsy). How is this treated? This condition may be treated by: Scraping off the actinic keratosis (curettage). Freezing the actinic keratosis with liquid nitrogen (cryosurgery). This causes the growth to eventually  fall off. Applying medicated creams or gels to destroy the cells in the growth. Applying chemicals to the growth to make the outer layers of skin peel off (chemical peel). Using photodynamic therapy. In this procedure, medicated cream is applied to the actinic keratosis. This cream increases your skin's sensitivity to light. Then, a strong light is aimed at the actinic keratosis to destroy cells in the growth. Follow these instructions at home: Skin care Apply cool, wet cloths (coolcompresses) to the affected areas. Do not scratch your skin. Check your skin regularly for any growths, especially ones that: Start to itch or bleed. Change in size, shape, or color. Caring for the treated area Keep the treated area clean and dry as told by your health care provider. Do not apply any medicine, cream, or lotion to the treated area unless your health care provider tells you to do that. Do not pick at blisters or try to break them open. This can cause infection and scarring. If you have red or irritated skin after treatment, follow instructions from your health care provider about how to take care of the treated area. Make sure you: Wash your hands with soap and water for at least 20 seconds before and after you change your bandage (dressing). If soap and water are not available, use hand sanitizer. Change your dressing as told by your health care provider. If you have red or irritated skin after treatment, check the treated area every day for signs of infection. Check for: Redness, swelling, or pain. Fluid or blood. Warmth. Pus or a bad   smell. Lifestyle Do not use any products that contain nicotine or tobacco. These products include cigarettes, chewing tobacco, and vaping devices, such as e-cigarettes. If you need help quitting, ask your health care provider. Take steps to protect your skin from the sun, such as: Avoiding the sun between 10:00 a.m. and 4:00 p.m. This is when the UV light is the  strongest. Using a sunscreen or sunblock with SPF 30 or greater. Applying sunscreen before you are exposed to sunlight and reapplying as often as told by the instructions on the sunscreen container. Wearing protective gear, including: Sunglasses with UV protection. A hat and clothing that protect your skin from sunlight. Avoiding medicines that increase your sensitivity to sunlight when possible. Avoidingtanning beds and other indoor tanning devices. General instructions Take or apply over-the-counter and prescription medicines only as told by your health care provider. Return to your normal activities as told by your health care provider. Ask your health care provider what activities are safe for you. Have a skin exam done every year by a health care provider who is a skin specialist (dermatologist). Keep all follow-up visits. Your health care provider will want to check that the site has healed after treatment. Contact a health care provider if: You notice any changes or new growths on your skin. You have swelling, pain, or redness around your treated area. You have fluid or blood coming from your treated area. Your treated area feels warm to the touch. You have pus or a bad smell coming from your treated area. You have a fever or chills. You have a blister that becomes large and painful. Summary An actinic keratosis is a precancerous growth on the skin.If left untreated, these growths can develop into skin cancer. Check your skin regularly for any growths, especially growths that start to itch or bleed, or change in size, shape, or color. Take steps to protect your skin from the sun. Contact a health care provider if you notice any changes or new growths on your skin. This information is not intended to replace advice given to you by your health care provider. Make sure you discuss any questions you have with your health care provider. Document Revised: 02/23/2022 Document Reviewed:  02/23/2022 Elsevier Patient Education  2024 Elsevier Inc.  

## 2023-07-27 NOTE — Progress Notes (Signed)
Subjective: CC: Chronic follow-up 6 months PCP: Raliegh Ip, DO ION:GEXBMW E Golphin is a 87 y.o. male presenting to clinic today for:  1.  Hypertension w/ CKD3 Patient reports that his nephrologist recently advance his Norvasc to 10 mg daily.  He immediately started having substantial swelling so he discontinued it for over a month and then resumed it back at just 5 mg daily.  He does report home blood pressures have been running high but today his blood pressure was totally normal.  He is compliant with hydrochlorothiazide, Diovan at current doses.  Using allopurinol for gout prevention.  Denies any chest pain, shortness of breath  2.  Hyperlipidemia Compliant with Pravachol, fenofibrate.  3.  Allergic rhinitis Patient reports mild cough that is intermittent.  Needs refill on Flonase.  ROS: Per HPI  Allergies  Allergen Reactions   Augmentin [Amoxicillin-Pot Clavulanate] Diarrhea   Past Medical History:  Diagnosis Date   Back pain    Cancer (HCC)    prostrate   Chronic kidney disease    kidney stones and 1 smaller kidney, history of a kidney blockage   Gout    Hyperlipidemia    Hypertension    dr Dorinda [facility] moore  pcp   Hypothyroidism    Pneumonia     Current Outpatient Medications:    acetaminophen (TYLENOL) 500 MG tablet, Take 500 mg by mouth every 6 (six) hours as needed for moderate pain., Disp: , Rfl:    allopurinol (ZYLOPRIM) 100 MG tablet, TAKE 1 TABLET BY MOUTH DAILY, Disp: 100 tablet, Rfl: 1   amLODipine (NORVASC) 5 MG tablet, Take 1.5 tablets (7.5 mg total) by mouth daily., Disp: 150 tablet, Rfl: 3   Cholecalciferol (VITAMIN D) 50 MCG (2000 UT) tablet, Take 2,000 Units by mouth daily., Disp: , Rfl:    clotrimazole-betamethasone (LOTRISONE) cream, Apply 1 application topically daily as needed., Disp: 30 g, Rfl: 0   fenofibrate 160 MG tablet, TAKE 1 TABLET BY MOUTH DAILY, Disp: 100 tablet, Rfl: 1   fish oil-omega-3 fatty acids 1000 MG capsule, Take 1 g by  mouth 2 (two) times daily., Disp: , Rfl:    fluticasone (FLONASE) 50 MCG/ACT nasal spray, Place 2 sprays into both nostrils daily., Disp: 16 g, Rfl: 6   hydrochlorothiazide (HYDRODIURIL) 25 MG tablet, TAKE 1 TABLET BY MOUTH DAILY, Disp: 100 tablet, Rfl: 0   hydrocortisone cream 1 %, Apply 1 application topically daily as needed for itching., Disp: , Rfl:    levothyroxine (SYNTHROID) 125 MCG tablet, TAKE 1 TABLET BY MOUTH DAILY, Disp: 100 tablet, Rfl: 1   ondansetron (ZOFRAN-ODT) 4 MG disintegrating tablet, Take 1 tablet (4 mg total) by mouth every 8 (eight) hours as needed for nausea or vomiting., Disp: 20 tablet, Rfl: 0   pravastatin (PRAVACHOL) 80 MG tablet, TAKE 1 TABLET BY MOUTH DAILY, Disp: 100 tablet, Rfl: 1   sodium chloride (OCEAN) 0.65 % SOLN nasal spray, Place 1 spray into both nostrils daily., Disp: , Rfl:    valsartan (DIOVAN) 320 MG tablet, TAKE 1 TABLET BY MOUTH DAILY, Disp: 100 tablet, Rfl: 1   vitamin C (ASCORBIC ACID) 500 MG tablet, Take 500 mg by mouth daily., Disp: , Rfl:  Social History   Socioeconomic History   Marital status: Married    Spouse name: Jewel   Number of children: 1   Years of education: Not on file   Highest education level: 12th grade  Occupational History   Occupation: retired    Associate Professor: [employer]  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   Tobacco Use   Smoking status: Former    Current packs/day: 0.00    Average packs/day: 1 pack/day for 20.0 years (20.0 ttl pk-yrs)    Types: Cigarettes    Start date: 04/01/1957    Quit date: 04/01/1977    Years since quitting: 46.3   Smokeless tobacco: Never  Vaping Use   Vaping status: Never Used  Substance and Sexual Activity   Alcohol use: No   Drug use: No   Sexual activity: Not on file  Other Topics Concern   Not on file  Social History Narrative   Randie Heinz grandson lives with them   Daughter lives next door   Social Determinants of Health   Financial Resource Strain: Low Risk  (01/27/2022)   Overall Financial Resource  Strain (CARDIA)    Difficulty of Paying Living Expenses: Not hard at all  Food Insecurity: No Food Insecurity (01/27/2022)   Hunger Vital Sign    Worried About Running Out of Food in the Last Year: Never true    Ran Out of Food in the Last Year: Never true  Transportation Needs: No Transportation Needs (01/27/2022)   PRAPARE - Administrator, Civil Service (Medical): No    Lack of Transportation (Non-Medical): No  Physical Activity: Insufficiently Active (01/27/2022)   Exercise Vital Sign    Days of Exercise per Week: 7 days    Minutes of Exercise per Session: 20 min  Stress: No Stress Concern Present (01/27/2022)   [employer] of Occupational Health - Occupational Stress Questionnaire    Feeling of Stress : Not at all  Social Connections: Socially Integrated (01/27/2022)   Social Connection and Isolation Panel [NHANES]    Frequency of Communication with Friends and Family: More than three times a week    Frequency of Social Gatherings with Friends and Family: More than three times a week    Attends Religious Services: More than 4 times per year    Active Member of [employer] or Organizations: Yes    Attends Engineer, structural: More than 4 times per year    Marital Status: Married  Catering manager Violence: Not At Risk (01/27/2022)   Humiliation, Afraid, Rape, and Kick questionnaire    Fear of Current or Ex-Partner: No    Emotionally Abused: No    Physically Abused: No    Sexually Abused: No   Family History  Problem Relation Age of Onset   Heart attack Father    Cancer Brother        unknown origin   Obesity Maternal Grandmother    Cancer Paternal Grandfather        possible -- growth kidney    Anesthesia problems Neg Hx     Objective: Office vital signs reviewed. BP 131/74   Pulse 81   Temp 99.4 F (37.4 C) (Oral)   Ht 5\' 7"  (1.702 m)   Wt 212 lb 3.2 oz (96.3 kg)   SpO2 91%   BMI 33.24 kg/m   Physical Examination:  General: Awake, alert, well  nourished, No acute distress HEENT: sclera white, MMM Cardio: regular rate and rhythm, S1S2 heard, no murmurs appreciated Pulm: clear to auscultation bilaterally, no wheezes, rhonchi or rales; normal work of breathing on room air Skin: Several areas of actinic keratosis along bilateral arms.  Left forearm with 5 across the dorsal aspect of the forearm.  He has 3 large hyperkeratotic lesions noted along the dorsum of the hand on the left.  On the right forearm he has 4 actinic keratoses noted on the dorsal aspect.  Cryotherapy Procedure:  Risks and benefits of procedure were reviewed with the patient.  Written consent obtained and scanned into the chart.  Lesion of concern was identified and located on right forearm x 3, left forearm x 5 and left dorsal hand x 3.  Liquid nitrogen was applied to area of concern and extending out 1 millimeters beyond the border of the lesion.  Treated area was allowed to come back to room temperature before treating it a second time.  Patient tolerated procedure well and there were no immediate complications.  Home care instructions were reviewed with the patient and a handout was provided.   Assessment/ Plan: 87 y.o. male   Hypertension associated with stage 3 chronic kidney disease due to type 2 diabetes mellitus (HCC) - Plan: CBC with Differential, CMP14+EGFR, VITAMIN D 25 Hydroxy (Vit-D Deficiency, Fractures), hydrochlorothiazide (HYDRODIURIL) 25 MG tablet, valsartan (DIOVAN) 320 MG tablet  Acquired hypothyroidism - Plan: TSH, T4, Free  Actinic keratoses  Idiopathic chronic gout of multiple sites without tophus - Plan: allopurinol (ZYLOPRIM) 100 MG tablet  Pure hypercholesterolemia - Plan: fenofibrate 160 MG tablet, pravastatin (PRAVACHOL) 80 MG tablet  Non-seasonal allergic rhinitis due to pollen - Plan: fluticasone (FLONASE) 50 MCG/ACT nasal spray  History of prostate cancer - Plan: PSA, total and free  Check renal function, vitamin D.  Blood pressure  is controlled with 5 mg of Norvasc, HCTZ and valsartan.  For now continue current regimen.  Asymptomatic from a thyroid standpoint.  Thyroid medications renewed  Actinic keratoses treated with cryoablation today.  He tolerated procedure without difficulty  Continue statin.  Will be due for lipid panel at next visit  Flonase renewed  Did not discuss gout or prostate cancer but did collect labs today  Orders Placed This Encounter  Procedures   CBC with Differential   CMP14+EGFR   TSH   T4, Free   VITAMIN D 25 Hydroxy (Vit-D Deficiency, Fractures)   PSA, total and free   No orders of the defined types were placed in this encounter.    Raliegh Ip, DO [facility] [phone]

## 2023-07-30 ENCOUNTER — Ambulatory Visit: Payer: Medicare Other

## 2023-07-30 DIAGNOSIS — R0902 Hypoxemia: Secondary | ICD-10-CM

## 2023-07-30 NOTE — Progress Notes (Signed)
Patient here today because he wanted to recheck oxygen level.  When he was in the office on 07/27/23 his level was 91% and he was concerned.  Oxygen level today registered at 99%.

## 2023-07-30 NOTE — Progress Notes (Signed)
Patient r/c  

## 2023-08-06 ENCOUNTER — Ambulatory Visit: Payer: Medicare Other

## 2023-08-20 DIAGNOSIS — R3912 Poor urinary stream: Secondary | ICD-10-CM | POA: Diagnosis not present

## 2023-09-12 ENCOUNTER — Telehealth: Payer: Self-pay | Admitting: Family Medicine

## 2023-09-12 NOTE — Telephone Encounter (Signed)
VO given to change manufacturer on Levothyroxine

## 2023-09-12 NOTE — Telephone Encounter (Signed)
Question about levothyroxine (SYNTHROID) 125 MCG tablet ok to change to new manufactor. Please call back

## 2023-09-24 DIAGNOSIS — I129 Hypertensive chronic kidney disease with stage 1 through stage 4 chronic kidney disease, or unspecified chronic kidney disease: Secondary | ICD-10-CM | POA: Diagnosis not present

## 2023-09-24 DIAGNOSIS — R809 Proteinuria, unspecified: Secondary | ICD-10-CM | POA: Diagnosis not present

## 2023-09-24 DIAGNOSIS — R609 Edema, unspecified: Secondary | ICD-10-CM | POA: Diagnosis not present

## 2023-09-24 DIAGNOSIS — N184 Chronic kidney disease, stage 4 (severe): Secondary | ICD-10-CM | POA: Diagnosis not present

## 2023-09-24 DIAGNOSIS — D631 Anemia in chronic kidney disease: Secondary | ICD-10-CM | POA: Diagnosis not present

## 2023-09-24 DIAGNOSIS — N200 Calculus of kidney: Secondary | ICD-10-CM | POA: Diagnosis not present

## 2023-10-03 DIAGNOSIS — M48062 Spinal stenosis, lumbar region with neurogenic claudication: Secondary | ICD-10-CM | POA: Diagnosis not present

## 2023-10-03 DIAGNOSIS — M4150 Other secondary scoliosis, site unspecified: Secondary | ICD-10-CM | POA: Diagnosis not present

## 2023-10-11 ENCOUNTER — Ambulatory Visit: Payer: Medicare Other

## 2023-10-11 DIAGNOSIS — M5116 Intervertebral disc disorders with radiculopathy, lumbar region: Secondary | ICD-10-CM | POA: Diagnosis not present

## 2023-10-11 DIAGNOSIS — M5416 Radiculopathy, lumbar region: Secondary | ICD-10-CM | POA: Diagnosis not present

## 2023-11-02 ENCOUNTER — Telehealth: Payer: Self-pay

## 2023-11-02 ENCOUNTER — Ambulatory Visit (INDEPENDENT_AMBULATORY_CARE_PROVIDER_SITE_OTHER): Payer: Medicare Other | Admitting: Nurse Practitioner

## 2023-11-02 ENCOUNTER — Encounter: Payer: Self-pay | Admitting: Nurse Practitioner

## 2023-11-02 VITALS — BP 135/78 | HR 101 | Temp 98.2°F | Resp 20 | Ht 67.0 in | Wt 214.0 lb

## 2023-11-02 DIAGNOSIS — J40 Bronchitis, not specified as acute or chronic: Secondary | ICD-10-CM

## 2023-11-02 MED ORDER — AZITHROMYCIN 250 MG PO TABS: ORAL_TABLET | ORAL | 0 refills | Status: DC

## 2023-11-02 MED ORDER — BENZONATATE 100 MG PO CAPS: 100.0000 mg | ORAL_CAPSULE | Freq: Three times a day (TID) | ORAL | 0 refills | Status: DC | PRN

## 2023-11-02 NOTE — Progress Notes (Signed)
Subjective:    [name], male    DOB: 1936-08-22, 87 y.o.   MRN: [id number]   Chief Complaint: Cough   Cough The current episode started in the past 7 days. The problem has been waxing and waning. The problem occurs every few minutes. The cough is Productive of sputum. Associated symptoms include rhinorrhea. Pertinent negatives include no chills, ear congestion, ear pain, fever or shortness of breath. Nothing aggravates the symptoms. He has tried OTC cough suppressant for the symptoms. The treatment provided mild relief. His past medical history is significant for pneumonia. Environmental allergies: history X2.   Patient Active Problem List   Diagnosis Date Noted   Lumbar stenosis with neurogenic claudication 08/23/2021   Hip pain 10/06/2020   Scoliosis deformity of spine 10/06/2020   Acquired hypothyroidism 07/24/2019   Backache 09/08/2016   Benign hypertension with chronic kidney disease, stage IV (HCC) 03/18/2015   Allergic rhinitis due to pollen 03/18/2015   Metabolic syndrome 02/12/2014   Hyperlipidemia 05/28/2013   Vitamin D deficiency 05/28/2013   Chronic kidney disease (CKD), stage IV (severe) (HCC) 05/28/2013   History of prostate cancer 05/28/2013   Gout 05/28/2013       Review of Systems  Constitutional:  Negative for chills and fever.  HENT:  Positive for rhinorrhea. Negative for ear pain.   Respiratory:  Positive for cough. Negative for shortness of breath.   Allergic/Immunologic: Environmental allergies: history X2.       Objective:   Physical Exam Constitutional:      Appearance: Normal appearance. He is obese.  HENT:     Right Ear: Tympanic membrane normal.     Left Ear: Tympanic membrane normal.     Nose: Congestion and rhinorrhea present.     Mouth/Throat:     Pharynx: No oropharyngeal exudate or posterior oropharyngeal erythema.  Cardiovascular:     Rate and Rhythm: Normal rate and regular rhythm.     Heart sounds: Normal  heart sounds.  Pulmonary:     Breath sounds: Rhonchi (throughout) present.  Skin:    General: Skin is warm.  Neurological:     General: No focal deficit present.     Mental Status: He is alert and oriented to person, place, and time.  Psychiatric:        Mood and Affect: Mood normal.        Behavior: Behavior normal.       BP 135/78   Pulse (!) 101   Temp 98.2 F (36.8 C) (Temporal)   Resp 20   Ht 5\' 7"  (1.702 m)   Wt 214 lb (97.1 kg)   SpO2 97%   BMI 33.52 kg/m      Assessment & Plan:   Valetta Mole in today with chief complaint of Cough   1. Bronchitis 1. Take meds as prescribed 2. Use a cool mist humidifier especially during the winter months and when heat has been humid. 3. Use saline nose sprays frequently 4. Saline irrigations of the nose can be very helpful if done frequently.  * 4X daily for 1 week*  * Use of a nettie pot can be helpful with this. Follow directions with this* 5. Drink plenty of fluids 6. Keep thermostat turn down low 7.For any cough or congestion- tessalon perles 8. For fever or aces or pains- take tylenol or ibuprofen appropriate for age and weight.  * for fevers greater than 101 orally you may alternate ibuprofen and tylenol every  3  hours.    - azithromycin (ZITHROMAX Z-PAK) 250 MG tablet; As directed  Dispense: 6 tablet; Refill: 0 - benzonatate (TESSALON PERLES) 100 MG capsule; Take 1 capsule (100 mg total) by mouth 3 (three) times daily as needed for cough.  Dispense: 20 capsule; Refill: 0    The above assessment and management plan was discussed with the patient. The patient verbalized understanding of and has agreed to the management plan. Patient is aware to call the clinic if symptoms persist or worsen. Patient is aware when to return to the clinic for a follow-up visit. Patient educated on when it is appropriate to go to the emergency department.   Mary-Margaret Daphine Deutscher, FNP

## 2023-11-02 NOTE — Telephone Encounter (Signed)
Copied from CRM [id number]. Topic: Clinical - Medical Advice >> Nov 02, 2023  9:27 AM Herbert Seta B wrote: Reason for CRM: Patient would like to speak with PCP or care team, has cough and inquiring about possible antibiotics  Called and spoke with patient. Appt given for today

## 2023-11-02 NOTE — Patient Instructions (Addendum)

## 2023-11-06 ENCOUNTER — Telehealth: Payer: Self-pay | Admitting: Family Medicine

## 2023-11-06 NOTE — Telephone Encounter (Unsigned)
Copied from CRM [id number]. Topic: Clinical - Prescription Issue >> Nov 06, 2023  3:37 PM Keith Wiley wrote: Reason for CRM: Pt std Zpak is not working, feel worse than before / Pt is requesting an antibiotic

## 2023-11-07 ENCOUNTER — Ambulatory Visit (INDEPENDENT_AMBULATORY_CARE_PROVIDER_SITE_OTHER): Payer: Medicare Other

## 2023-11-07 ENCOUNTER — Encounter: Payer: Self-pay | Admitting: Family Medicine

## 2023-11-07 ENCOUNTER — Ambulatory Visit: Payer: Medicare Other | Admitting: Family Medicine

## 2023-11-07 VITALS — BP 149/70 | HR 93 | Temp 98.3°F | Ht 67.0 in | Wt 212.0 lb

## 2023-11-07 DIAGNOSIS — J988 Other specified respiratory disorders: Secondary | ICD-10-CM | POA: Diagnosis not present

## 2023-11-07 DIAGNOSIS — R062 Wheezing: Secondary | ICD-10-CM | POA: Diagnosis not present

## 2023-11-07 DIAGNOSIS — I700 Atherosclerosis of aorta: Secondary | ICD-10-CM | POA: Diagnosis not present

## 2023-11-07 MED ORDER — ONDANSETRON 4 MG PO TBDP: 4.0000 mg | ORAL_TABLET | Freq: Three times a day (TID) | ORAL | 0 refills | Status: DC | PRN

## 2023-11-07 MED ORDER — AMOXICILLIN-POT CLAVULANATE 875-125 MG PO TABS
1.0000 | ORAL_TABLET | Freq: Two times a day (BID) | ORAL | 0 refills | Status: DC
Start: 2023-11-07 — End: 2024-02-06

## 2023-11-07 NOTE — Progress Notes (Signed)
BP (!) 149/70   Pulse 93   Temp 98.3 F (36.8 C)   Ht 5\' 7"  (1.702 m)   Wt 212 lb (96.2 kg)   SpO2 98%   BMI 33.20 kg/m    Subjective:   [name], male    DOB: Mar 30, 1936, 87 y.o.   MRN: [id number]  HPI: Keith Wiley is a 87 y.o. male presenting on 11/07/2023 for URI   HPI Patient comes in today complaining of coughing and wheezing.  He said it started about a week and a half ago and he was seen about a week ago and was started on azithromycin and Tessalon Perles and did not feel like they helped at all.  He is still having a significant cough and it is a deep cough and he feels like it is down in his chest.  It is mostly dry but sometimes productive.  He denies any shortness of breath but does have wheezing especially when he lays down he feels like he is more wheezy and coughing when he lays down especially at night.  He has a low-grade temperature in the evenings up to a 99 degree temperature.  He also gets some headaches along with it.  He denies any history of lung disease that he knows of.  He has a very distant history of smoking and quit in 1978.   Relevant past medical, surgical, family and social history reviewed and updated as indicated. Interim medical history since our last visit reviewed. Allergies and medications reviewed and updated.  Review of Systems  Constitutional:  Negative for chills and fever.  HENT:  Positive for congestion and sinus pressure. Negative for ear discharge, ear pain, postnasal drip, rhinorrhea, sneezing, sore throat and voice change.   Eyes:  Negative for pain, discharge, redness and visual disturbance.  Respiratory:  Positive for cough and wheezing. Negative for shortness of breath.   Cardiovascular:  Negative for chest pain and leg swelling.  Musculoskeletal:  Negative for gait problem.  Skin:  Negative for rash and wound.  All other systems reviewed and are negative.   Per HPI unless specifically indicated  above   Allergies as of 11/07/2023       Reactions   Augmentin [amoxicillin-pot Clavulanate] Diarrhea        Medication List        Accurate as of November 07, 2023 12:34 PM. If you have any questions, ask your nurse or doctor.          STOP taking these medications    azithromycin 250 MG tablet Commonly known as: Zithromax Z-Pak Stopped by: Elige Radon Willmer Fellers   benzonatate 100 MG capsule Commonly known as: Lawyer Stopped by: Elige Radon Camila [place]       TAKE these medications    acetaminophen 500 MG tablet Commonly known as: TYLENOL Take 500 mg by mouth every 6 (six) hours as needed for moderate pain.   allopurinol 100 MG tablet Commonly known as: ZYLOPRIM Take 1 tablet (100 mg total) by mouth daily.   amLODipine 5 MG tablet Commonly known as: NORVASC Take 1.5 tablets (7.5 mg total) by mouth daily.   amoxicillin-clavulanate 875-125 MG tablet Commonly known as: AUGMENTIN Take 1 tablet by mouth 2 (two) times daily. Started by: Elige Radon Peng Thorstenson   ascorbic acid 500 MG tablet Commonly known as: VITAMIN C Take 500 mg by mouth daily.   clotrimazole-betamethasone cream Commonly known as: LOTRISONE Apply 1 application topically daily as needed.  fenofibrate 160 MG tablet Take 1 tablet (160 mg total) by mouth daily.   fish oil-omega-3 fatty acids 1000 MG capsule Take 1 g by mouth 2 (two) times daily.   fluticasone 50 MCG/ACT nasal spray Commonly known as: FLONASE Place 2 sprays into both nostrils daily.   hydrochlorothiazide 25 MG tablet Commonly known as: HYDRODIURIL Take 1 tablet (25 mg total) by mouth daily.   hydrocortisone cream 1 % Apply 1 application topically daily as needed for itching.   levothyroxine 125 MCG tablet Commonly known as: SYNTHROID TAKE 1 TABLET BY MOUTH DAILY   ondansetron 4 MG disintegrating tablet Commonly known as: ZOFRAN-ODT Take 1 tablet (4 mg total) by mouth every 8 (eight) hours as needed for nausea or  vomiting.   pravastatin 80 MG tablet Commonly known as: PRAVACHOL Take 1 tablet (80 mg total) by mouth daily.   sodium chloride 0.65 % Soln nasal spray Commonly known as: OCEAN Place 1 spray into both nostrils daily.   valsartan 320 MG tablet Commonly known as: DIOVAN Take 1 tablet (320 mg total) by mouth daily.   Vitamin D 50 MCG (2000 UT) tablet Take 2,000 Units by mouth daily.         Objective:   BP (!) 149/70   Pulse 93   Temp 98.3 F (36.8 C)   Ht 5\' 7"  (1.702 m)   Wt 212 lb (96.2 kg)   SpO2 98%   BMI 33.20 kg/m   Wt Readings from Last 3 Encounters:  11/07/23 212 lb (96.2 kg)  11/02/23 214 lb (97.1 kg)  07/27/23 212 lb 3.2 oz (96.3 kg)    Physical Exam Vitals and nursing note reviewed.  Constitutional:      General: He is not in acute distress.    Appearance: He is well-developed. He is not diaphoretic.  Eyes:     General: No scleral icterus.    Conjunctiva/sclera: Conjunctivae normal.  Neck:     Thyroid: No thyromegaly.  Cardiovascular:     Rate and Rhythm: Normal rate and regular rhythm.     Heart sounds: Normal heart sounds. No murmur heard. Pulmonary:     Effort: Pulmonary effort is normal. No respiratory distress.     Breath sounds: Rales (Right basilar crackles) present. No wheezing.  Musculoskeletal:        General: Normal range of motion.     Cervical back: Neck supple.  Lymphadenopathy:     Cervical: No cervical adenopathy.  Skin:    General: Skin is warm and dry.     Findings: No rash.  Neurological:     Mental Status: He is alert and oriented to person, place, and time.     Coordination: Coordination normal.  Psychiatric:        Behavior: Behavior normal.     Chest x-ray: Vascular congestion especially on the left lower and right hilar regions, await final read from radiology.  No consolidation noted.  Assessment & Plan:   Problem List Items Addressed This Visit   None Visit Diagnoses     Respiratory infection    -   Primary   Relevant Medications   amoxicillin-clavulanate (AUGMENTIN) 875-125 MG tablet   ondansetron (ZOFRAN-ODT) 4 MG disintegrating tablet   Other Relevant Orders   DG Chest 2 View     Patient said he has done well with the Augmentin before and takes it with a nausea medicine, it is not a true allergy but just a reaction.  Will go ahead and treat  with Augmentin.  Will treat like possible respiratory infection or early pneumonia based on x-ray.  Follow up plan: Return if symptoms worsen or fail to improve.  Counseling provided for all of the vaccine components Orders Placed This Encounter  Procedures   DG Chest 2 View    Arville Care, MD [facility] 11/07/2023, 12:34 PM

## 2023-11-27 ENCOUNTER — Other Ambulatory Visit: Payer: Self-pay | Admitting: Family Medicine

## 2023-11-27 DIAGNOSIS — E039 Hypothyroidism, unspecified: Secondary | ICD-10-CM

## 2024-01-07 DIAGNOSIS — H35372 Puckering of macula, left eye: Secondary | ICD-10-CM | POA: Diagnosis not present

## 2024-01-07 DIAGNOSIS — Z961 Presence of intraocular lens: Secondary | ICD-10-CM | POA: Diagnosis not present

## 2024-01-07 DIAGNOSIS — H25811 Combined forms of age-related cataract, right eye: Secondary | ICD-10-CM | POA: Diagnosis not present

## 2024-02-06 ENCOUNTER — Ambulatory Visit: Payer: Medicare Other | Admitting: Family Medicine

## 2024-02-06 ENCOUNTER — Encounter: Payer: Self-pay | Admitting: Family Medicine

## 2024-02-06 VITALS — BP 175/86 | HR 81 | Temp 98.7°F | Ht 67.0 in | Wt 215.4 lb

## 2024-02-06 DIAGNOSIS — N184 Chronic kidney disease, stage 4 (severe): Secondary | ICD-10-CM

## 2024-02-06 DIAGNOSIS — M1A09X0 Idiopathic chronic gout, multiple sites, without tophus (tophi): Secondary | ICD-10-CM

## 2024-02-06 DIAGNOSIS — Z0001 Encounter for general adult medical examination with abnormal findings: Secondary | ICD-10-CM

## 2024-02-06 DIAGNOSIS — I129 Hypertensive chronic kidney disease with stage 1 through stage 4 chronic kidney disease, or unspecified chronic kidney disease: Secondary | ICD-10-CM | POA: Diagnosis not present

## 2024-02-06 DIAGNOSIS — E7800 Pure hypercholesterolemia, unspecified: Secondary | ICD-10-CM

## 2024-02-06 DIAGNOSIS — E039 Hypothyroidism, unspecified: Secondary | ICD-10-CM | POA: Diagnosis not present

## 2024-02-06 DIAGNOSIS — Z0000 Encounter for general adult medical examination without abnormal findings: Secondary | ICD-10-CM

## 2024-02-06 MED ORDER — AMLODIPINE BESYLATE 5 MG PO TABS: 7.5000 mg | ORAL_TABLET | Freq: Every day | ORAL | 3 refills | Status: DC

## 2024-02-06 MED ORDER — VALSARTAN 320 MG PO TABS: 320.0000 mg | ORAL_TABLET | Freq: Every day | ORAL | 3 refills | Status: AC

## 2024-02-06 MED ORDER — ALLOPURINOL 100 MG PO TABS: 100.0000 mg | ORAL_TABLET | Freq: Every day | ORAL | 3 refills | Status: AC

## 2024-02-06 MED ORDER — HYDROCHLOROTHIAZIDE 25 MG PO TABS: 25.0000 mg | ORAL_TABLET | Freq: Every day | ORAL | 3 refills | Status: AC

## 2024-02-06 MED ORDER — PRAVASTATIN SODIUM 80 MG PO TABS: 80.0000 mg | ORAL_TABLET | Freq: Every day | ORAL | 3 refills | Status: AC

## 2024-02-06 MED ORDER — LEVOTHYROXINE SODIUM 125 MCG PO TABS: 125.0000 ug | ORAL_TABLET | Freq: Every day | ORAL | 3 refills | Status: AC

## 2024-02-06 MED ORDER — FENOFIBRATE 160 MG PO TABS: 160.0000 mg | ORAL_TABLET | Freq: Every day | ORAL | 3 refills | Status: AC

## 2024-02-06 NOTE — Patient Instructions (Signed)

## 2024-02-06 NOTE — Progress Notes (Signed)
Keith Wiley is a 88 y.o. male presents to office today for annual physical exam examination.    Concerns today include: 1. Hypertension Patient reports BP has been running pretty consistently 160s over 70s to 80s.  He did have some positional lightheadedness about 2 to 3 weeks ago and this seemed to last about a week before resolving.  He denies any chest pain, shortness of breath, visual disturbance or dizziness now.  He admits that he does eat salt but never add salt to his food.  Frequently eating processed meats, canned goods and occasionally fast foods like hot dogs and chicken sandwiches.  Occupation: retired, Marital status: married, Substance use: none There are no preventive care reminders to display for this patient.  Refills needed today: all  Immunization History  Administered Date(s) Administered   Fluad Quad(high Dose 65+) 11/04/2019, 12/03/2020, 11/25/2021, 10/04/2022   Influenza, High Dose Seasonal PF 10/10/2016, 09/27/2017, 10/30/2018   Influenza,inj,Quad PF,6+ Mos 10/13/2013, 11/04/2014, 11/09/2015   Influenza-Unspecified 10/10/2023   Janssen (J&J) SARS-COV-2 Vaccination 04/30/2020   Pneumococcal Conjugate-13 10/13/2013   Pneumococcal Polysaccharide-23 10/30/2018   Tdap 08/21/2011   Zoster Recombinant(Shingrix) 01/26/2022   Zoster, Live 08/21/2010   Past Medical History:  Diagnosis Date   Back pain    Cancer (HCC)    prostrate   Chronic kidney disease    kidney stones and 1 smaller kidney, history of a kidney blockage   Gout    Hyperlipidemia    Hypertension    dr Dorinda Hill moore  pcp   Hypothyroidism    Pneumonia    Social History   Socioeconomic History   Marital status: Married    Spouse name: Jewel   Number of children: 1   Years of education: Not on file   Highest education level: 12th grade  Occupational History   Occupation: retired    Associate Professor: [employer]    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   Tobacco Use   Smoking status: Former    Current packs/day:  0.00    Average packs/day: 1 pack/day for 20.0 years (20.0 ttl pk-yrs)    Types: Cigarettes    Start date: 04/01/1957    Quit date: 04/01/1977    Years since quitting: 46.8   Smokeless tobacco: Never  Vaping Use   Vaping status: Never Used  Substance and Sexual Activity   Alcohol use: No   Drug use: No   Sexual activity: Not on file  Other Topics Concern   Not on file  Social History Narrative   Randie Heinz grandson lives with them   Daughter lives next door   Social Drivers of Health   Financial Resource Strain: Low Risk  (02/06/2024)   Overall Financial Resource Strain (CARDIA)    Difficulty of Paying Living Expenses: Not hard at all  Food Insecurity: No Food Insecurity (02/06/2024)   Hunger Vital Sign    Worried About Running Out of Food in the Last Year: Never true    Ran Out of Food in the Last Year: Never true  Transportation Needs: No Transportation Needs (02/06/2024)   PRAPARE - Administrator, Civil Service (Medical): No    Lack of Transportation (Non-Medical): No  Physical Activity: Insufficiently Active (02/06/2024)   Exercise Vital Sign    Days of Exercise per Week: 7 days    Minutes of Exercise per Session: 20 min  Stress: No Stress Concern Present (02/06/2024)   Keith [facility] [employer] - Occupational Stress Questionnaire    Feeling of  Stress : Not at all  Social Connections: Socially Integrated (02/06/2024)   Social Connection and Isolation Panel [NHANES]    Frequency of Communication with Friends and Family: More than three times a week    Frequency of Social Gatherings with Friends and Family: More than three times a week    Attends Religious Services: More than 4 times per year    Active Member of [employer] or Organizations: Yes    Attends Engineer, structural: More than 4 times per year    Marital Status: Married  Catering manager Violence: Not At Risk (02/06/2024)   Humiliation, Afraid, Rape, and Kick questionnaire    Fear of Current  or Ex-Partner: No    Emotionally Abused: No    Physically Abused: No    Sexually Abused: No   Past Surgical History:  Procedure Laterality Date   BACK SURGERY     cervical   1989, 2011   CARPAL TUNNEL RELEASE     COLONOSCOPY     HERNIA REPAIR     LUMBAR LAMINECTOMY/DECOMPRESSION MICRODISCECTOMY N/A 08/23/2021   Procedure: Lumbar one-tow, Lumbar two-three, Lumbar three-four, Lumbar four-five, Lumbar five-Sacral one Laminectomy and foraminotomy;  Surgeon: Barnett Abu, MD;  Location: [facility] OR;  Service: Neurosurgery;  Laterality: N/A;  3C/RM 20   prostrate     seeds inplant   2009   Family History  Problem Relation Age of Onset   Heart attack Father    Cancer Brother        unknown origin   Obesity Maternal Grandmother    Cancer Paternal Grandfather        possible -- growth kidney    Anesthesia problems Neg Hx     Current Outpatient Medications:    acetaminophen (TYLENOL) 500 MG tablet, Take 500 mg by mouth every 6 (six) hours as needed for moderate pain., Disp: , Rfl:    Cholecalciferol (VITAMIN D) 50 MCG (2000 UT) tablet, Take 2,000 Units by mouth daily., Disp: , Rfl:    clotrimazole-betamethasone (LOTRISONE) cream, Apply 1 application topically daily as needed., Disp: 30 g, Rfl: 0   fish oil-omega-3 fatty acids 1000 MG capsule, Take 1 g by mouth 2 (two) times daily., Disp: , Rfl:    fluticasone (FLONASE) 50 MCG/ACT nasal spray, Place 2 sprays into both nostrils daily., Disp: 16 g, Rfl: 6   hydrocortisone cream 1 %, Apply 1 application topically daily as needed for itching., Disp: , Rfl:    sodium chloride (OCEAN) 0.65 % SOLN nasal spray, Place 1 spray into both nostrils daily., Disp: , Rfl:    vitamin C (ASCORBIC ACID) 500 MG tablet, Take 500 mg by mouth daily., Disp: , Rfl:    allopurinol (ZYLOPRIM) 100 MG tablet, Take 1 tablet (100 mg total) by mouth daily., Disp: 100 tablet, Rfl: 3   amLODipine (NORVASC) 5 MG tablet, Take 1.5 tablets (7.5 mg total) by mouth daily., Disp: 150  tablet, Rfl: 3   fenofibrate 160 MG tablet, Take 1 tablet (160 mg total) by mouth daily., Disp: 100 tablet, Rfl: 3   hydrochlorothiazide (HYDRODIURIL) 25 MG tablet, Take 1 tablet (25 mg total) by mouth daily., Disp: 100 tablet, Rfl: 3   levothyroxine (SYNTHROID) 125 MCG tablet, Take 1 tablet (125 mcg total) by mouth daily., Disp: 100 tablet, Rfl: 3   pravastatin (PRAVACHOL) 80 MG tablet, Take 1 tablet (80 mg total) by mouth daily., Disp: 100 tablet, Rfl: 3   valsartan (DIOVAN) 320 MG tablet, Take 1 tablet (320 mg total)  by mouth daily., Disp: 100 tablet, Rfl: 3  Allergies  Allergen Reactions   Augmentin [Amoxicillin-Pot Clavulanate] Diarrhea     ROS: Review of Systems Pertinent items noted in HPI and remainder of comprehensive ROS otherwise negative.    Physical exam BP (!) 175/86   Pulse 81   Temp 98.7 F (37.1 C)   Ht 5\' 7"  (1.702 m)   Wt 215 lb 6.4 oz (97.7 kg)   SpO2 100%   BMI 33.74 kg/m  General appearance: alert, cooperative, appears stated age, no distress, and moderately obese Head: Normocephalic, without obvious abnormality, atraumatic Eyes: negative findings: lids and lashes normal, conjunctivae and sclerae normal, and corneas clear Ears: normal TM's and external ear canals both ears Nose: Nares normal. Septum midline. Mucosa normal. No drainage or sinus tenderness. Throat: lips, mucosa, and tongue normal; teeth and gums normal Neck: no adenopathy, supple, symmetrical, trachea midline, and thyroid not enlarged, symmetric, no tenderness/mass/nodules Back: Slightly increased kyphosis of thoracic spine Lungs: clear to auscultation bilaterally Chest wall: no tenderness Heart: regular rate and rhythm, S1, S2 normal, no murmur, click, rub or gallop Abdomen: soft, non-tender; bowel sounds normal; no masses,  no organomegaly Extremities: extremities normal, atraumatic, no cyanosis or edema Pulses: 2+ and symmetric Skin:  Multiple senile lentigo noted Lymph nodes: Cervical,  supraclavicular, and axillary nodes normal. Neurologic: Grossly normal      02/06/2024    8:05 AM 11/02/2023    2:34 PM 07/27/2023    8:10 AM  Depression screen PHQ 2/9  Decreased Interest 0 0 0  Down, Depressed, Hopeless 0 0 0  PHQ - 2 Score 0 0 0  Altered sleeping  0 0  Tired, decreased energy  1 2  Change in appetite  0 0  Feeling bad or failure about yourself   0 0  Trouble concentrating  0 0  Moving slowly or fidgety/restless  0 0  Suicidal thoughts  0 0  PHQ-9 Score  1 2  Difficult doing work/chores  Somewhat difficult Somewhat difficult      11/02/2023    2:35 PM 07/27/2023    8:11 AM 01/24/2023    8:01 AM 08/01/2022    2:05 PM  GAD 7 : Generalized Anxiety Score  Nervous, Anxious, on Edge 0 0 0 0  Control/stop worrying 0 0 0 0  Worry too much - different things 0 0 0 0  Trouble relaxing 0 0 0 0  Restless 0 0 0 0  Easily annoyed or irritable 0 0 0 0  Afraid - awful might happen 0 0 0 0  Total GAD 7 Score 0 0 0 0  Anxiety Difficulty Not difficult at all Not difficult at all Not difficult at all Not difficult at all     Assessment/ Plan: Valetta Mole here for annual physical exam.   Annual physical exam  Benign hypertension with chronic kidney disease, stage IV (HCC) - Plan: Uric Acid, amLODipine (NORVASC) 5 MG tablet, hydrochlorothiazide (HYDRODIURIL) 25 MG tablet, valsartan (DIOVAN) 320 MG tablet  Acquired hypothyroidism - Plan: TSH + free T4, levothyroxine (SYNTHROID) 125 MCG tablet  Pure hypercholesterolemia - Plan: Lipid Panel, fenofibrate 160 MG tablet, pravastatin (PRAVACHOL) 80 MG tablet  Idiopathic chronic gout of multiple sites without tophus - Plan: Uric Acid, allopurinol (ZYLOPRIM) 100 MG tablet  Hypertension not controlled.  I reiterated that he is supposed to be on 7.5 mg of Norvasc daily.  It sounds like he is only been taking 5 mg.  Would like him  to have a blood pressure check with nurse in 2 weeks.  Check thyroid levels.  Clinically  asymptomatic  Check fasting lipid.  Continue pravastatin, fenofibrate  Check uric acid level.  Continue allopurinol  Counseled on healthy lifestyle choices, including diet (rich in fruits, vegetables and lean meats and low in salt and simple carbohydrates) and exercise (at least 30 minutes of moderate physical activity daily).  Patient to follow up 52m with me but 2 weeks with nurse for BP check.  Glema Takaki M. Nadine Counts, DO

## 2024-02-07 LAB — LIPID PANEL
Chol/HDL Ratio: 2.5 {ratio} (ref 0.0–5.0)
Cholesterol, Total: 119 mg/dL (ref 100–199)
HDL: 47 mg/dL (ref >39)
LDL Chol Calc (NIH): 58 mg/dL (ref 0–99)
Triglycerides: 68 mg/dL (ref 0–149)
VLDL Cholesterol Cal: 14 mg/dL (ref 5–40)

## 2024-02-07 LAB — TSH+FREE T4
Free T4: 1.72 ng/dL (ref 0.82–1.77)
TSH: 2.46 u[IU]/mL (ref 0.450–4.500)

## 2024-02-07 LAB — URIC ACID: Uric Acid: 6.5 mg/dL (ref 3.8–8.4)

## 2024-02-20 ENCOUNTER — Ambulatory Visit (INDEPENDENT_AMBULATORY_CARE_PROVIDER_SITE_OTHER): Payer: Medicare Other

## 2024-02-20 NOTE — Progress Notes (Signed)
 Patient is in office today for a nurse visit for Blood Pressure Check. Patient blood pressure was 134/63, Patient denies any symptoms.

## 2024-05-23 DIAGNOSIS — N184 Chronic kidney disease, stage 4 (severe): Secondary | ICD-10-CM | POA: Diagnosis not present

## 2024-05-23 DIAGNOSIS — N189 Chronic kidney disease, unspecified: Secondary | ICD-10-CM | POA: Diagnosis not present

## 2024-05-23 DIAGNOSIS — I129 Hypertensive chronic kidney disease with stage 1 through stage 4 chronic kidney disease, or unspecified chronic kidney disease: Secondary | ICD-10-CM | POA: Diagnosis not present

## 2024-05-23 DIAGNOSIS — R609 Edema, unspecified: Secondary | ICD-10-CM | POA: Diagnosis not present

## 2024-05-23 DIAGNOSIS — D631 Anemia in chronic kidney disease: Secondary | ICD-10-CM | POA: Diagnosis not present

## 2024-05-23 DIAGNOSIS — R809 Proteinuria, unspecified: Secondary | ICD-10-CM | POA: Diagnosis not present

## 2024-05-23 DIAGNOSIS — N200 Calculus of kidney: Secondary | ICD-10-CM | POA: Diagnosis not present

## 2024-05-27 LAB — LAB REPORT - SCANNED
Creatinine, POC: 45.9 mg/dL
EGFR: 33

## 2024-07-10 ENCOUNTER — Other Ambulatory Visit: Payer: Self-pay

## 2024-07-10 ENCOUNTER — Emergency Department (HOSPITAL_BASED_OUTPATIENT_CLINIC_OR_DEPARTMENT_OTHER)
Admission: EM | Admit: 2024-07-10 | Discharge: 2024-07-10 | Disposition: A | Discharge status: Home / Self Care | Attending: Emergency Medicine | Admitting: Emergency Medicine

## 2024-07-10 ENCOUNTER — Emergency Department (HOSPITAL_COMMUNITY)

## 2024-07-10 ENCOUNTER — Emergency Department (HOSPITAL_BASED_OUTPATIENT_CLINIC_OR_DEPARTMENT_OTHER)

## 2024-07-10 ENCOUNTER — Encounter (HOSPITAL_BASED_OUTPATIENT_CLINIC_OR_DEPARTMENT_OTHER): Payer: Self-pay | Admitting: Emergency Medicine

## 2024-07-10 DIAGNOSIS — R9082 White matter disease, unspecified: Secondary | ICD-10-CM | POA: Diagnosis not present

## 2024-07-10 DIAGNOSIS — G319 Degenerative disease of nervous system, unspecified: Secondary | ICD-10-CM | POA: Diagnosis not present

## 2024-07-10 DIAGNOSIS — Z79899 Other long term (current) drug therapy: Secondary | ICD-10-CM | POA: Diagnosis not present

## 2024-07-10 DIAGNOSIS — Z79890 Hormone replacement therapy (postmenopausal): Secondary | ICD-10-CM | POA: Diagnosis not present

## 2024-07-10 DIAGNOSIS — Z87891 Personal history of nicotine dependence: Secondary | ICD-10-CM | POA: Diagnosis not present

## 2024-07-10 DIAGNOSIS — N189 Chronic kidney disease, unspecified: Secondary | ICD-10-CM | POA: Insufficient documentation

## 2024-07-10 DIAGNOSIS — E039 Hypothyroidism, unspecified: Secondary | ICD-10-CM | POA: Diagnosis not present

## 2024-07-10 DIAGNOSIS — Z471 Aftercare following joint replacement surgery: Secondary | ICD-10-CM | POA: Diagnosis not present

## 2024-07-10 DIAGNOSIS — I129 Hypertensive chronic kidney disease with stage 1 through stage 4 chronic kidney disease, or unspecified chronic kidney disease: Secondary | ICD-10-CM | POA: Diagnosis not present

## 2024-07-10 DIAGNOSIS — R42 Dizziness and giddiness: Secondary | ICD-10-CM | POA: Insufficient documentation

## 2024-07-10 DIAGNOSIS — R29818 Other symptoms and signs involving the nervous system: Secondary | ICD-10-CM | POA: Diagnosis not present

## 2024-07-10 DIAGNOSIS — Z8546 Personal history of malignant neoplasm of prostate: Secondary | ICD-10-CM | POA: Diagnosis not present

## 2024-07-10 LAB — COMPREHENSIVE METABOLIC PANEL WITH GFR
ALT: 14 U/L (ref 0–44)
AST: 26 U/L (ref 15–41)
Albumin: 4.2 g/dL (ref 3.5–5.0)
Alkaline Phosphatase: 49 U/L (ref 38–126)
Anion gap: 13 (ref 5–15)
BUN: 34 mg/dL — ABNORMAL HIGH (ref 8–23)
CO2: 21 mmol/L — ABNORMAL LOW (ref 22–32)
Calcium: 9.6 mg/dL (ref 8.9–10.3)
Chloride: 109 mmol/L (ref 98–111)
Creatinine, Ser: 2.27 mg/dL — ABNORMAL HIGH (ref 0.61–1.24)
GFR, Estimated: 27 mL/min — ABNORMAL LOW (ref >60)
Glucose, Bld: 108 mg/dL — ABNORMAL HIGH (ref 70–99)
Potassium: 4.1 mmol/L (ref 3.5–5.1)
Sodium: 143 mmol/L (ref 135–145)
Total Bilirubin: 0.5 mg/dL (ref 0.0–1.2)
Total Protein: 6.9 g/dL (ref 6.5–8.1)

## 2024-07-10 LAB — DIFFERENTIAL
Abs Immature Granulocytes: 0.02 K/uL (ref 0.00–0.07)
Basophils Absolute: 0.1 K/uL (ref 0.0–0.1)
Basophils Relative: 1 %
Eosinophils Absolute: 0.1 K/uL (ref 0.0–0.5)
Eosinophils Relative: 1 %
Immature Granulocytes: 0 %
Lymphocytes Relative: 27 %
Lymphs Abs: 2.1 K/uL (ref 0.7–4.0)
Monocytes Absolute: 0.6 K/uL (ref 0.1–1.0)
Monocytes Relative: 8 %
Neutro Abs: 4.9 K/uL (ref 1.7–7.7)
Neutrophils Relative %: 63 %

## 2024-07-10 LAB — PROTIME-INR
INR: 1 (ref 0.8–1.2)
Prothrombin Time: 13.7 s (ref 11.4–15.2)

## 2024-07-10 LAB — CBC
HCT: 38 % — ABNORMAL LOW (ref 39.0–52.0)
Hemoglobin: 12.1 g/dL — ABNORMAL LOW (ref 13.0–17.0)
MCH: 30.6 pg (ref 26.0–34.0)
MCHC: 31.8 g/dL (ref 30.0–36.0)
MCV: 96 fL (ref 80.0–100.0)
Platelets: 278 K/uL (ref 150–400)
RBC: 3.96 MIL/uL — ABNORMAL LOW (ref 4.22–5.81)
RDW: 13.2 % (ref 11.5–15.5)
WBC: 7.8 K/uL (ref 4.0–10.5)
nRBC: 0 % (ref 0.0–0.2)

## 2024-07-10 LAB — APTT: aPTT: 28 s (ref 24–36)

## 2024-07-10 LAB — ETHANOL: Alcohol, Ethyl (B): <15 mg/dL (ref <15)

## 2024-07-10 MED ORDER — MECLIZINE HCL 25 MG PO TABS
25.0000 mg | ORAL_TABLET | Freq: Once | ORAL | Status: AC
  Administered 2024-07-10: 25 mg via ORAL
  Filled 2024-07-10: qty 1

## 2024-07-10 MED ORDER — MECLIZINE HCL 25 MG PO TABS
25.0000 mg | ORAL_TABLET | Freq: Three times a day (TID) | ORAL | 0 refills | Status: AC | PRN
Start: 2024-07-10 — End: ?

## 2024-07-10 NOTE — ED Notes (Signed)
 [facility] notified patient is here

## 2024-07-10 NOTE — ED Triage Notes (Signed)
 Coming from [facility] for MRI   [facility] triage:   Reports woke up with severe dizziness and headache this morning. LKN 2300. No unilateral deficits or visual changes. HTN in triage despite personal meds.    Reports splitting wood yesterday with no issues.

## 2024-07-10 NOTE — ED Provider Notes (Signed)
 Care was taken over from Dr. Zackowski.  Patient had presented with some vague dizziness and feeling off balance, leaning to the left.  CT scan was negative.  No other focal neurologic deficits.  Discussed with patient going to [facility] for an MRI.  Patient accepted by Dr. Emil.   Lenor Hollering, MD 07/10/24 1524

## 2024-07-10 NOTE — ED Provider Notes (Signed)
   ED Course / MDM   Clinical Course as of 07/10/24 2046  Thu Jul 10, 2024  2042 Patient transferred from [facility] emergency department for MRI to r/o CNS cause of dizziness/balance issue. Patient reports his symptoms are vastly improved s/p meclizine . MRI performed without signs of central cause of vertigo. Suspect peripheral vertigo. Patient was able to ambulate with nursing and would like to go home. Requests additional dose of meclizine . Discussed sedating nature of meclizine  with patient and wife. Will discharge patient to home. All questions answered. Patient comfortable with plan of discharge. Return precautions discussed with patient and specified on the after visit summary.  [WS]    Clinical Course User Index [WS] Francesca Elsie CROME, MD   Medical Decision Making Amount and/or Complexity of Data Reviewed Labs: ordered. Radiology: ordered.          Francesca Elsie CROME, MD 07/10/24 2046

## 2024-07-10 NOTE — ED Notes (Signed)
 He leaves with [facility] at this time. Report given to Lauraine, RN, CN [place].

## 2024-07-10 NOTE — ED Notes (Signed)
Pt ambulated well with walker; MD notified.

## 2024-07-10 NOTE — ED Triage Notes (Addendum)
 Reports woke up with severe dizziness and headache this morning. LKN 2300. No unilateral deficits or visual changes. HTN in triage despite personal meds.   Reports splitting wood yesterday with no issues.

## 2024-07-10 NOTE — ED Notes (Signed)
 Called [facility] to transport patient to [facility]--MRI r/o stroke--Dr. Emil accepting

## 2024-07-10 NOTE — ED Provider Notes (Signed)
 [place] [facility] AT [facility] Provider Note   CSN: [id number] Arrival date & time: 07/10/24  1231     Patient presents with: Dizziness   Keith Wiley is a 88 y.o. male.   Patient with complaint of feeling off balance may be some dizziness little bit of room spinning associated with a headache.  Patient went to bed at 2300 yesterday felt fine.  Awoke at 5 in the morning and noted that he seemed to be off balance leaning more to the left.  Feeling lightheaded maybe a little bit of dizziness but no true room spinning.  Patient's had brief episodes of this in the past.  But nothing is lasted this long is why he came in.  Patient denies any visual changes any speech problems any numbness or weakness in upper extremities lower extremity.  Past medical history in for hypothyroidism prostate cancer hypertension hyperlipidemia chronic kidney disease.  Patient is a former smoker quit 1978.       Prior to Admission medications   Medication Sig Start Date End Date Taking? Authorizing Provider  doxazosin (CARDURA) 1 MG tablet Take 1 mg by mouth daily. 05/23/24  Yes [provider]  tamsulosin (FLOMAX) 0.4 MG CAPS capsule Take 0.4 mg by mouth daily. 05/23/24  Yes [provider]  acetaminophen  (TYLENOL ) 500 MG tablet Take 500 mg by mouth every 6 (six) hours as needed for moderate pain.    [provider]  allopurinol  (ZYLOPRIM ) 100 MG tablet Take 1 tablet (100 mg total) by mouth daily. 02/06/24   Jolinda Norene HERO, DO  amLODipine  (NORVASC ) 5 MG tablet Take 1.5 tablets (7.5 mg total) by mouth daily. 02/06/24   Jolinda Norene HERO, DO  Cholecalciferol (VITAMIN D ) 50 MCG (2000 UT) tablet Take 2,000 Units by mouth daily.    [provider]  clotrimazole -betamethasone  (LOTRISONE ) cream Apply 1 application topically daily as needed. 01/26/22   Jolinda Norene HERO, DO  fenofibrate  160 MG tablet Take 1 tablet (160 mg total) by mouth daily. 02/06/24    Jolinda Norene HERO, DO  fish oil-omega-3 fatty acids 1000 MG capsule Take 1 g by mouth 2 (two) times daily.    [provider]  fluticasone  (FLONASE ) 50 MCG/ACT nasal spray Place 2 sprays into both nostrils daily. 07/27/23   Jolinda Norene HERO, DO  hydrochlorothiazide  (HYDRODIURIL ) 25 MG tablet Take 1 tablet (25 mg total) by mouth daily. 02/06/24   Jolinda Norene HERO, DO  hydrocortisone cream 1 % Apply 1 application topically daily as needed for itching.    [provider]  levothyroxine  (SYNTHROID ) 125 MCG tablet Take 1 tablet (125 mcg total) by mouth daily. 02/06/24   Jolinda Norene HERO, DO  pravastatin  (PRAVACHOL ) 80 MG tablet Take 1 tablet (80 mg total) by mouth daily. 02/06/24   Jolinda Norene HERO, DO  sodium chloride  (OCEAN) 0.65 % SOLN nasal spray Place 1 spray into both nostrils daily.    [provider]  valsartan  (DIOVAN ) 320 MG tablet Take 1 tablet (320 mg total) by mouth daily. 02/06/24   Jolinda Norene HERO, DO  vitamin C  (ASCORBIC ACID ) 500 MG tablet Take 500 mg by mouth daily.    [provider]    Allergies: Augmentin  [amoxicillin -pot clavulanate]    Review of Systems  Constitutional:  Negative for chills and fever.  HENT:  Negative for ear pain and sore throat.   Eyes:  Negative for pain and visual disturbance.  Respiratory:  Negative for cough and shortness of  breath.   Cardiovascular:  Negative for chest pain and palpitations.  Gastrointestinal:  Negative for abdominal pain and vomiting.  Genitourinary:  Negative for dysuria and hematuria.  Musculoskeletal:  Negative for arthralgias and back pain.  Skin:  Negative for color change and rash.  Neurological:  Positive for dizziness, light-headedness and headaches. Negative for seizures, syncope, facial asymmetry, speech difficulty, weakness and numbness.  All other systems reviewed and are negative.   Updated Vital Signs BP (!) 152/66   Pulse 68   Temp 98.7 F (37.1 C) (Oral)    Resp 18   SpO2 96%   Physical Exam Vitals and nursing note reviewed.  Constitutional:      General: He is not in acute distress.    Appearance: Normal appearance. He is well-developed.  HENT:     Head: Normocephalic and atraumatic.     Mouth/Throat:     Mouth: Mucous membranes are moist.  Eyes:     Extraocular Movements: Extraocular movements intact.     Conjunctiva/sclera: Conjunctivae normal.     Pupils: Pupils are equal, round, and reactive to light.  Cardiovascular:     Rate and Rhythm: Normal rate and regular rhythm.     Heart sounds: No murmur heard. Pulmonary:     Effort: Pulmonary effort is normal. No respiratory distress.     Breath sounds: Normal breath sounds.  Abdominal:     Palpations: Abdomen is soft.     Tenderness: There is no abdominal tenderness.  Musculoskeletal:        General: No swelling.     Cervical back: Normal range of motion and neck supple.  Skin:    General: Skin is warm and dry.     Capillary Refill: Capillary refill takes less than 2 seconds.  Neurological:     General: No focal deficit present.     Mental Status: He is alert and oriented to person, place, and time.     Cranial Nerves: No cranial nerve deficit.     Sensory: No sensory deficit.     Motor: No weakness.     Comments: Dizziness is not reproducible by moving head left to right.  Psychiatric:        Mood and Affect: Mood normal.     (all labs ordered are listed, but only abnormal results are displayed) Labs Reviewed  ETHANOL  PROTIME-INR  APTT  CBC  DIFFERENTIAL  COMPREHENSIVE METABOLIC PANEL WITH GFR  URINE DRUG SCREEN    EKG: EKG Interpretation Date/Time:  Thursday July 10 2024 12:48:37 EDT Ventricular Rate:  73 PR Interval:  208 QRS Duration:  98 QT Interval:  420 QTC Calculation: 463 R Axis:   42  Text Interpretation: Sinus rhythm Ventricular premature complex Aberrant conduction of SV complex(es) Probable left atrial enlargement Low voltage, precordial  leads Confirmed by Geraldene Hamilton [phone]) on 07/10/2024 12:58:58 PM  Radiology: No results found.   Procedures   Medications Ordered in the ED  meclizine  (ANTIVERT ) tablet 25 mg (has no administration in time range)                                    Medical Decision Making Amount and/or Complexity of Data Reviewed Labs: ordered. Radiology: ordered.   Patient symptoms not reproducible by moving his head left or right while laying down.  However if he sits up he does get the sensation.  And if he tries to stand  up he feels like he is going to fall to the left side.  All this is somewhat concerning for possible stroke.  Will treat with some Antivert  to see if it helps.  Will get head CT and stroke order set.  But certainly out of the window for any intervention.  CRITICAL CARE Performed by: Megha Agnes Total critical care time: 45 minutes Critical care time was exclusive of separately billable procedures and treating other patients. Critical care was necessary to treat or prevent imminent or life-threatening deterioration. Critical care was time spent personally by me on the following activities: development of treatment plan with patient and/or surrogate as well as nursing, discussions with consultants, evaluation of patient's response to treatment, examination of patient, obtaining history from patient or surrogate, ordering and performing treatments and interventions, ordering and review of laboratory studies, ordering and review of radiographic studies, pulse oximetry and re-evaluation of patient's condition.   Final diagnoses:  Dizziness    ED Discharge Orders     None          Geraldene Hamilton, MD 07/10/24 1404

## 2024-07-10 NOTE — Discharge Instructions (Addendum)
 We evaluated you for your vertigo and dizziness.  Your MRI test was negative for any stroke.  Your symptoms improved with medication for vertigo.  We suspect your symptoms were due to a type of vertigo called peripheral vertigo.  This is typically caused by an issue with your inner ear.  It is not dangerous but can be very uncomfortable.  We have prescribed you medication which you can take 3 times daily as needed for dizziness or vertigo.  Please be careful when taking this medicine as it can cause you to be sleepy and increase your risk of falls.  Please follow-up closely with your primary doctor.  Please return if you have any new or worsening symptoms such as uncontrolled vertigo, trouble walking, numbness or tingling, weakness, facial droop, trouble swallowing, vision changes, or any other new symptoms

## 2024-08-04 NOTE — Progress Notes (Signed)
 Subjective: CC: General follow-up PCP: Jolinda Norene HERO, DO YEP:Keith Wiley is a 88 y.o. male presenting to clinic today for:  1.  Hypothyroidism/ HTN Compliant with Synthroid  125 mcg daily.  No reports of tremor or heart palpitations but he did have some unusual vertiginous symptoms last month that required eval in the ER.  He had negative MRI for CVA or TIA.  He notes that about a month and a half prior to onset of events he had had some well-controlled blood pressure with new doxazosin plus Flomax.  His amlodipine  was discontinued.  He was continued on hydrochlorothiazide .  He reports he still does not feel quite right but he is not having the same vertiginous symptoms he was having before.  He is seeing Dr. Watt soon and needs a PSA collected given history of prostate cancer.  Blood pressure still fluctuating anywhere between 140s and 150s systolic at home.  He reports some improvement in lower extremity edema after discontinuation of amlodipine  but no resolution  2.  Rash Patient reports itchy rash along the left lower ankle.  He has applied his betamethasone  clotrimazole  cream but did not find it super helpful.  Also tried Neosporin but did not resolve issue.  3.  Arthritis Asking for handicap placard as he gives out going long distances  ROS: Per HPI  Allergies  Allergen Reactions   Augmentin  [Amoxicillin -Pot Clavulanate] Diarrhea   Past Medical History:  Diagnosis Date   Back pain    Cancer (HCC)    prostrate   Chronic kidney disease    kidney stones and 1 smaller kidney, history of a kidney blockage   Gout    Hyperlipidemia    Hypertension    dr nancyann moore  pcp   Hypothyroidism    Pneumonia     Current Outpatient Medications:    acetaminophen  (TYLENOL ) 500 MG tablet, Take 500 mg by mouth every 6 (six) hours as needed for moderate pain., Disp: , Rfl:    allopurinol  (ZYLOPRIM ) 100 MG tablet, Take 1 tablet (100 mg total) by mouth daily., Disp: 100 tablet, Rfl:  3   Cholecalciferol (VITAMIN D ) 50 MCG (2000 UT) tablet, Take 2,000 Units by mouth daily., Disp: , Rfl:    clotrimazole -betamethasone  (LOTRISONE ) cream, Apply 1 application topically daily as needed., Disp: 30 g, Rfl: 0   doxazosin (CARDURA) 1 MG tablet, Take 1 mg by mouth daily., Disp: , Rfl:    fenofibrate  160 MG tablet, Take 1 tablet (160 mg total) by mouth daily., Disp: 100 tablet, Rfl: 3   fish oil-omega-3 fatty acids 1000 MG capsule, Take 1 g by mouth 2 (two) times daily., Disp: , Rfl:    fluticasone  (FLONASE ) 50 MCG/ACT nasal spray, Place 2 sprays into both nostrils daily., Disp: 16 g, Rfl: 6   hydrochlorothiazide  (HYDRODIURIL ) 25 MG tablet, Take 1 tablet (25 mg total) by mouth daily., Disp: 100 tablet, Rfl: 3   levothyroxine  (SYNTHROID ) 125 MCG tablet, Take 1 tablet (125 mcg total) by mouth daily., Disp: 100 tablet, Rfl: 3   meclizine  (ANTIVERT ) 25 MG tablet, Take 1 tablet (25 mg total) by mouth 3 (three) times daily as needed for dizziness., Disp: 30 tablet, Rfl: 0   pravastatin  (PRAVACHOL ) 80 MG tablet, Take 1 tablet (80 mg total) by mouth daily., Disp: 100 tablet, Rfl: 3   tamsulosin (FLOMAX) 0.4 MG CAPS capsule, Take 0.4 mg by mouth daily., Disp: , Rfl:    valsartan  (DIOVAN ) 320 MG tablet, Take 1 tablet (320 mg total)  by mouth daily., Disp: 100 tablet, Rfl: 3   vitamin C  (ASCORBIC ACID ) 500 MG tablet, Take 500 mg by mouth daily., Disp: , Rfl:    amLODipine  (NORVASC ) 5 MG tablet, Take 1.5 tablets (7.5 mg total) by mouth daily. 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   Tobacco Use   Smoking status: Former    Current packs/day: 0.00    Average packs/day: 1 pack/day for 20.0 years  (20.0 ttl pk-yrs)    Types: Cigarettes    Start date: 04/01/1957    Quit date: 04/01/1977    Years since quitting: 47.3   Smokeless tobacco: Never  Vaping Use   Vaping status: Never Used  Substance and Sexual Activity   Alcohol use: No   Drug use: No   Sexual activity: Not on file  Other Topics Concern   Not on file  Social History Narrative   [place] grandson lives with them   Daughter lives next door   Social Drivers of Health   Financial Resource Strain: Low Risk  (02/06/2024)   Overall Financial Resource Strain (CARDIA)    Difficulty of Paying Living Expenses: Not hard at all  Food Insecurity: No Food Insecurity (02/06/2024)   Hunger Vital Sign    Worried About Running Out of Food in the Last Year: Never true    Ran Out of Food in the Last Year: Never true  Transportation Needs: No Transportation Needs (02/06/2024)   PRAPARE - Administrator, Civil Service (Medical): No    Lack of Transportation (Non-Medical): No  Physical Activity: Insufficiently Active (02/06/2024)   Exercise Vital Sign    Days of Exercise per Week: 7 days    Minutes of Exercise per Session: 20 min  Stress: No Stress Concern Present (02/06/2024)   [employer] of Occupational Health - Occupational Stress Questionnaire    Feeling of Stress : Not at all  Social Connections: Socially Integrated (02/06/2024)   Social Connection and Isolation Panel    Frequency of Communication with Friends and Family: More than three times a week    Frequency of Social Gatherings with Friends and Family: More than three times a week    Attends Religious Services: More than 4 times per year    Active Member of [employer] or Organizations: Yes    Attends Engineer, structural: More than 4 times per year    Marital Status: Married  Catering manager Violence: Not At Risk (02/06/2024)   Humiliation, Afraid, Rape, and Kick questionnaire    Fear of Current or Ex-Partner: No    Emotionally Abused: No    Physically  Abused: No    Sexually Abused: No   Family History  Problem Relation Age of Onset   Heart attack Father    Cancer Brother        unknown origin   Obesity Maternal Grandmother    Cancer Paternal Grandfather        possible -- growth kidney    Anesthesia problems Neg Hx     Objective: Office vital signs reviewed. BP (!) 153/85   Pulse 83   Temp 98 F (36.7 C)  Ht 5' 7 (1.702 m)   Wt 213 lb (96.6 kg)   SpO2 95%   BMI 33.36 kg/m   Physical Examination:  General: Awake, alert, nontoxic male, No acute distress HEENT: Sclera white.  Moist mucous membranes Cardio: regular rate and rhythm, S1S2 heard, no murmurs appreciated Pulm: clear to auscultation bilaterally, no wheezes, rhonchi or rales; normal work of breathing on room air Extremities: warm, well perfused, bilateral trace ankle edema, no cyanosis or clubbing; +2 pulses bilaterally MSK: Antalgic gait and hunched station but ambulating independently Skin: Left medial ankle with psoriatic plaque  Orthostatic VS for the past 72 hrs (Last 3 readings):  Orthostatic BP Patient Position BP Location Orthostatic Pulse  08/05/24 0910 (!) 160/91 Standing Left Arm 83  08/05/24 0909 163/87 Sitting Right Arm 76  08/05/24 0906 155/85 Supine Right Arm 68   Assessment/ Plan: 88 y.o. male   Acquired hypothyroidism - Plan: TSH + free T4  History of prostate cancer - Plan: PSA, CANCELED: PSA  Benign hypertension with chronic kidney disease, stage IV (HCC)  Labile blood pressure  Psoriasis - Plan: triamcinolone  ointment (KENALOG ) 0.5 %  Other idiopathic scoliosis, thoracolumbar region  Clinically euthyroid.    PSA collected and will CC to Dr. Watt  Continue to follow-up with nephrology as directed.  I do have concerns that perhaps the combination of Flomax and doxazosin may be precipitating some blood pressure lability and/or orthostasis. I advised him to trial back on the amlodipine  and hold the doxazosin for now to see if  his symptoms improved.  I am going to reevaluate him in 2 weeks and if he has had resolution of symptoms but return of edema, we can consider alternative antihypertensives.  Of course ARB and ACE inhibitor are not options for him given advanced renal disease at this point but we might give to add something like hydralazine or clonidine.  He had a psoriatic plaque along the left medial ankle and I prescribed a topical for this  Handicap placard completed and returned to patient   Norene CHRISTELLA Fielding, DO [facility] [phone]

## 2024-08-05 ENCOUNTER — Encounter: Payer: Self-pay | Admitting: Family Medicine

## 2024-08-05 ENCOUNTER — Ambulatory Visit (INDEPENDENT_AMBULATORY_CARE_PROVIDER_SITE_OTHER): Payer: Medicare Other | Admitting: Family Medicine

## 2024-08-05 VITALS — BP 153/85 | Temp 98.0°F | Ht 67.0 in | Wt 213.0 lb

## 2024-08-05 DIAGNOSIS — M4125 Other idiopathic scoliosis, thoracolumbar region: Secondary | ICD-10-CM | POA: Diagnosis not present

## 2024-08-05 DIAGNOSIS — E039 Hypothyroidism, unspecified: Secondary | ICD-10-CM | POA: Diagnosis not present

## 2024-08-05 DIAGNOSIS — R0989 Other specified symptoms and signs involving the circulatory and respiratory systems: Secondary | ICD-10-CM

## 2024-08-05 DIAGNOSIS — Z8546 Personal history of malignant neoplasm of prostate: Secondary | ICD-10-CM | POA: Diagnosis not present

## 2024-08-05 DIAGNOSIS — L409 Psoriasis, unspecified: Secondary | ICD-10-CM | POA: Diagnosis not present

## 2024-08-05 DIAGNOSIS — N184 Chronic kidney disease, stage 4 (severe): Secondary | ICD-10-CM | POA: Diagnosis not present

## 2024-08-05 DIAGNOSIS — I129 Hypertensive chronic kidney disease with stage 1 through stage 4 chronic kidney disease, or unspecified chronic kidney disease: Secondary | ICD-10-CM | POA: Diagnosis not present

## 2024-08-05 MED ORDER — TRIAMCINOLONE ACETONIDE 0.5 % EX OINT: 1.0000 | TOPICAL_OINTMENT | Freq: Two times a day (BID) | CUTANEOUS | 1 refills | Status: AC | PRN

## 2024-08-05 NOTE — Patient Instructions (Signed)
 Hold Doxazosin and try going back on Amlodipine  for next 2 weeks.  Psoriasis Psoriasis is a long-term (chronic) skin condition. It causes raised, red patches (plaques) that look silvery on your skin. The patches may be on all areas of your body and can be any size or shape. Symptoms can range from mild to very bad. This condition cannot be passed from one person to another (is not contagious). What are the causes? The exact cause of psoriasis is not known. It occurs because the body's defense system (immune system) attacks healthy skin. This causes the patches. Some things can make the condition worse. These are: Skin damage, such as cuts, scrapes, sunburn, and dryness. Not getting enough sunlight. Some medicines. Alcohol. Tobacco. Stress. Infections. What increases the risk? You are more likely to develop this condition if you: Have a family member with psoriasis. Are very overweight (obese). Are 72-67 years old. Take certain medicines. What are the signs or symptoms? There are different types of psoriasis. You can have more than one type. The types are: Plaque. This is the most common. Symptoms include red, raised patches with a silvery coating. These may be itchy. Your nails may be crumbly or fall off. Guttate. Symptoms include small red spots on your stomach area, arms, and legs. These may happen after you have been sick, especially with strep throat. Inverse. Symptoms include patches in your armpits, under your breasts, private areas, or on your butt. Pustular. Symptoms include swollen, red, pus-filled bumps that hurt on the palms of your hands or the soles of your feet. You also may feel very tired, weak, have a fever, and not be hungry. Erythrodermic. Symptoms include bright red skin that looks burned. You may have a fast heartbeat and a body temperature that is too high or too low. You may be itchy or in pain. Sebopsoriasis. Symptoms include red patches on your scalp, forehead, and  face that are greasy. Psoriatic arthritis. Symptoms include swollen, painful joints along with scaly skin patches. Your nails may be crumbly or fall off. There are times when symptoms may get worse (flares). How is this treated? There is no cure for this condition, but treatment can: Help your skin heal. Help with itching. Help with irritation and swelling (inflammation). Slow the growth of new skin cells. Help your body's defense system respond better to your skin. Help with any related conditions. Psoriasis can increase your risk of other conditions, such as: Heart disease. High blood pressure. Eye problems. Depression. Treatment may include: Creams or ointments. Light therapy. This may include natural sunlight or light therapy in a doctor's office. Medicines. These can help your body better manage skin cells. They may be used with light therapy or ointments. Medicines may include pills or injections. You may also get antibiotic medicines if you have an infection. These may include: Systemic therapy medicines. These medicines: Can help your body manage how the skin cells grow. Can help with irritation and swelling. May be given as pills or injections. Biologic medicines. These medicines: Are usually given through an IV or as injections. Are helpful for people with very bad symptoms. Have a higher risk of infection. Follow these instructions at home: Skin Care Use lotion on your skin as needed. Only use lotions that your doctor has said are okay. Put cool, wet cloths (cold compresses) on the affected areas. Do not use a hot tub or take hot showers. Use slightly warm water when taking showers and baths. Do not scratch your skin. Lifestyle  Keep a healthy weight. Do not eat a lot of foods that have a lot of solid fats, added sugars, or salt in them. Eat a healthy diet that includes lots of: Vegetables. Fruit. Whole grains. Low-fat dairy products. Lean proteins. Do not smoke or  use any products that contain nicotine or tobacco. If you need help quitting, ask your doctor. Lower your stress. Go out in the sun as told by your doctor. Do not get sunburned. Join a support group. General instructions  Take or use over-the-counter and prescription medicines only as told by your doctor. Track the things that cause symptoms (triggers). Try to avoid these things. Do not drink alcohol if your doctor tells you not to drink. See a counselor if you feel the support would help. Keep all follow-up visits. Your doctor will check on your condition to make sure it does not get worse or cause problems. Where to find support National Psoriasis Foundation: www.psoriasis.org Where to find more information American Academy of Dermatology: InfoExam.si Contact a doctor if: You have a fever or chills. Your signs or symptoms get worse. You have more redness or warmth in the affected areas. You have new or worse pain or stiffness in your joints. Your nails break easily or pull away from the nail bed. You feel very sad (depressed), frustrated, or hopeless. Get help right away if: You have thoughts of hurting yourself or others. Get help right away if you feel like you may hurt yourself or others, or have thoughts about taking your own life. Go to your nearest emergency room or: Call 911. Call the National Suicide Prevention Lifeline at 386-532-4352 or 988. This is open 24 hours a day. Text the Crisis Text Line at (770)602-0613. Summary Psoriasis is a long-term (chronic) skin condition. There is no cure for this condition, but treatment can help. Track the things that cause symptoms. Take or use over-the-counter and prescription medicines only as told by your doctor. Keep all follow-up visits. This information is not intended to replace advice given to you by your health care provider. Make sure you discuss any questions you have with your health care provider. Document Revised: 02/15/2022  Document Reviewed: 02/15/2022 Elsevier Patient Education  2024 [employer].

## 2024-08-06 ENCOUNTER — Ambulatory Visit: Payer: Self-pay | Admitting: Family Medicine

## 2024-08-06 DIAGNOSIS — E039 Hypothyroidism, unspecified: Secondary | ICD-10-CM

## 2024-08-06 LAB — TSH+FREE T4
Free T4: 1.48 ng/dL (ref 0.82–1.77)
TSH: 9.4 u[IU]/mL — ABNORMAL HIGH (ref 0.450–4.500)

## 2024-08-06 LAB — PSA: Prostate Specific Ag, Serum: <0.1 ng/mL (ref 0.0–4.0)

## 2024-08-08 ENCOUNTER — Telehealth: Payer: Self-pay | Admitting: Family Medicine

## 2024-08-08 NOTE — Telephone Encounter (Signed)
 Patient has appt 8-22 to recheck PSA and needs orders put in.

## 2024-08-08 NOTE — Telephone Encounter (Signed)
 Patient was needed TSH order which has been placed.

## 2024-08-15 ENCOUNTER — Other Ambulatory Visit

## 2024-08-15 DIAGNOSIS — E039 Hypothyroidism, unspecified: Secondary | ICD-10-CM

## 2024-08-16 LAB — TSH+FREE T4
Free T4: 1.44 ng/dL (ref 0.82–1.77)
TSH: 7.19 u[IU]/mL — ABNORMAL HIGH (ref 0.450–4.500)

## 2024-08-18 ENCOUNTER — Ambulatory Visit: Payer: Self-pay | Admitting: Family Medicine

## 2024-08-18 DIAGNOSIS — E039 Hypothyroidism, unspecified: Secondary | ICD-10-CM

## 2024-08-19 ENCOUNTER — Encounter: Payer: Self-pay | Admitting: Family Medicine

## 2024-08-19 ENCOUNTER — Ambulatory Visit (INDEPENDENT_AMBULATORY_CARE_PROVIDER_SITE_OTHER): Admitting: Family Medicine

## 2024-08-19 VITALS — BP 127/64 | HR 77 | Temp 98.1°F | Ht 67.0 in | Wt 216.0 lb

## 2024-08-19 DIAGNOSIS — Z8546 Personal history of malignant neoplasm of prostate: Secondary | ICD-10-CM

## 2024-08-19 DIAGNOSIS — L409 Psoriasis, unspecified: Secondary | ICD-10-CM | POA: Diagnosis not present

## 2024-08-19 DIAGNOSIS — N184 Chronic kidney disease, stage 4 (severe): Secondary | ICD-10-CM | POA: Diagnosis not present

## 2024-08-19 DIAGNOSIS — I129 Hypertensive chronic kidney disease with stage 1 through stage 4 chronic kidney disease, or unspecified chronic kidney disease: Secondary | ICD-10-CM

## 2024-08-19 DIAGNOSIS — E039 Hypothyroidism, unspecified: Secondary | ICD-10-CM | POA: Diagnosis not present

## 2024-08-19 MED ORDER — AMLODIPINE BESYLATE 5 MG PO TABS: 5.0000 mg | ORAL_TABLET | Freq: Every day | ORAL | 3 refills | Status: AC

## 2024-08-19 NOTE — Progress Notes (Signed)
 Subjective: CC: f/u BP, rash PCP: Keith Wiley [name] is a 88 y.o. male presenting to clinic today for:  Discussed the use of AI scribe software for clinical note transcription with the patient, who gave verbal consent to proceed.  History of Present Illness   Keith Wiley is an 88 year old male with hypertension and psoriasis who presents for follow-up on blood pressure management and skin rash.  He is currently taking amlodipine , valsartan , and hydrochlorothiazide  for blood pressure control. Home blood pressure readings range from 122/60 to 143/70 mmHg.  Doxazosin was discontinued 2 weeks ago due to fluctuating blood pressures.  Denies edema with resuming use of Amlodipine . Feeling better off Doxazosin.  He has a history of psoriasis and is experiencing improvement in his rash on his left ankle with triamcinolone  cream, which is more effective than previous treatments.  He discontinued doxazosin and is now on tamsulosin only, which helps with urinary dribbling. No significant changes in urinary flow have been noted since the medication change.  Has an appointment with urology tomorrow.    Recent thyroid  function tests show a decrease in TSH from 9.4 to 7.19.  Compliant with Synthroid .   He is concerned about his PSA levels, but they remain undetectable, consistent with his history over the past 15 years.      ROS: Per HPI  Allergies  Allergen Reactions   Augmentin  [Amoxicillin -Pot Clavulanate] Diarrhea   Past Medical History:  Diagnosis Date   Back pain    Cancer (HCC)    prostrate   Chronic kidney disease    kidney stones and 1 smaller kidney, history of a kidney blockage   Gout    Hyperlipidemia    Hypertension    dr Keith Wiley  pcp   Hypothyroidism    Pneumonia     Current Outpatient Medications:    acetaminophen  (TYLENOL ) 500 MG tablet, Take 500 mg by mouth every 6 (six) hours as needed for moderate pain., Disp: , Rfl:    allopurinol   (ZYLOPRIM ) 100 MG tablet, Take 1 tablet (100 mg total) by mouth daily., Disp: 100 tablet, Rfl: 3   Cholecalciferol (VITAMIN D ) 50 MCG (2000 UT) tablet, Take 2,000 Units by mouth daily., Disp: , Rfl:    clotrimazole -betamethasone  (LOTRISONE ) cream, Apply 1 application topically daily as needed., Disp: 30 g, Rfl: 0   doxazosin (CARDURA) 1 MG tablet, Take 1 mg by mouth daily., Disp: , Rfl:    fenofibrate  160 MG tablet, Take 1 tablet (160 mg total) by mouth daily., Disp: 100 tablet, Rfl: 3   fish oil-omega-3 fatty acids 1000 MG capsule, Take 1 g by mouth 2 (two) times daily., Disp: , Rfl:    fluticasone  (FLONASE ) 50 MCG/ACT nasal spray, Place 2 sprays into both nostrils daily., Disp: 16 g, Rfl: 6   hydrochlorothiazide  (HYDRODIURIL ) 25 MG tablet, Take 1 tablet (25 mg total) by mouth daily., Disp: 100 tablet, Rfl: 3   levothyroxine  (SYNTHROID ) 125 MCG tablet, Take 1 tablet (125 mcg total) by mouth daily., Disp: 100 tablet, Rfl: 3   meclizine  (ANTIVERT ) 25 MG tablet, Take 1 tablet (25 mg total) by mouth 3 (three) times daily as needed for dizziness., Disp: 30 tablet, Rfl: 0   pravastatin  (PRAVACHOL ) 80 MG tablet, Take 1 tablet (80 mg total) by mouth daily., Disp: 100 tablet, Rfl: 3   tamsulosin (FLOMAX) 0.4 MG CAPS capsule, Take 0.4 mg by mouth daily., Disp: , Rfl:    triamcinolone  ointment (KENALOG ) 0.5 %,  Apply 1 Application topically 2 (two) times daily as needed (psoriasis rash x7-10 days per flare up)., Disp: 30 g, Rfl: 1   valsartan  (DIOVAN ) 320 MG tablet, Take 1 tablet (320 mg total) by mouth daily., Disp: 100 tablet, Rfl: 3   vitamin C  (ASCORBIC ACID ) 500 MG tablet, Take 500 mg by mouth daily., Disp: , Rfl:    amLODipine  (NORVASC ) 5 MG tablet, Take 1 tablet (5 mg total) by mouth daily. Dose change. Dc Doxazosin, Disp: 100 tablet, Rfl: 3 Social History   Socioeconomic History   Marital status: Married    Spouse name: Jewel   Number of children: 1   Years of education: Not on file   Highest  education level: 12th grade  Occupational History   Occupation: retired    Associate Professor: [employer]    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   Tobacco Use   Smoking status: Former    Current packs/day: 0.00    Average packs/day: 1 pack/day for 20.0 years (20.0 ttl pk-yrs)    Types: Cigarettes    Start date: 04/01/1957    Quit date: 04/01/1977    Years since quitting: 47.4   Smokeless tobacco: Never  Vaping Use   Vaping status: Never Used  Substance and Sexual Activity   Alcohol use: No   Drug use: No   Sexual activity: Not on file  Other Topics Concern   Not on file  Social History Narrative   [place] grandson lives with them   Daughter lives next door   Social Drivers of Health   Financial Resource Strain: Low Risk  (02/06/2024)   Overall Financial Resource Strain (CARDIA)    Difficulty of Paying Living Expenses: Not hard at all  Food Insecurity: No Food Insecurity (02/06/2024)   Hunger Vital Sign    Worried About Running Out of Food in the Last Year: Never true    Ran Out of Food in the Last Year: Never true  Transportation Needs: No Transportation Needs (02/06/2024)   PRAPARE - Administrator, Civil Service (Medical): No    Lack of Transportation (Non-Medical): No  Physical Activity: Insufficiently Active (02/06/2024)   Exercise Vital Sign    Days of Exercise per Week: 7 days    Minutes of Exercise per Session: 20 min  Stress: No Stress Concern Present (02/06/2024)   Keith [facility] [employer] Occupational [employer] - Occupational Stress Questionnaire    Feeling of Stress : Not at all  Social Connections: Socially Integrated (02/06/2024)   Social Connection and Isolation Panel    Frequency of Communication with Friends and Family: More than three times a week    Frequency of Social Gatherings with Friends and Family: More than three times a week    Attends Religious Services: More than 4 times per year    Active Member of [employer] or Organizations: Yes    Attends Engineer, structural: More  than 4 times per year    Marital Status: Married  Catering manager Violence: Not At Risk (02/06/2024)   Humiliation, Afraid, Rape, and Kick questionnaire    Fear of Current or Ex-Partner: No    Emotionally Abused: No    Physically Abused: No    Sexually Abused: No   Family History  Problem Relation Age of Onset   Heart attack Father    Cancer Brother        unknown origin   Obesity Maternal Grandmother    Cancer Paternal Grandfather        possible --  growth kidney    Anesthesia problems Neg Hx     Objective: Office vital signs reviewed. BP 127/64   Pulse 77   Temp 98.1 F (36.7 C)   Ht 5' 7 (1.702 m)   Wt 216 lb (98 kg)   SpO2 99%   BMI 33.83 kg/m   Physical Examination:  General: Awake, alert, well nourished, No acute distress HEENT: sclera white, MMM Cardio: regular rate and rhythm, S1S2 heard, no murmurs appreciated Pulm: clear to auscultation bilaterally, no wheezes, rhonchi or rales; normal work of breathing on room air Skin: rash nearly resolved on left inner ankle. Extremities: trace ankle edema, nonpitting  Recent Results (from the past 2160 hours)  Lab report - scanned     Status: None   Collection Time: 05/27/24  4:40 PM  Result Value Ref Range   Creatinine, POC 45.9 mg/dL    Comment: Abstracted by HIM   EGFR 33.0     Comment: Abstracted by HIM  Ethanol     Status: None   Collection Time: 07/10/24  2:05 PM  Result Value Ref Range   Alcohol, Ethyl (B) <15 <15 mg/dL    Comment: (NOTE) For medical purposes only. Performed at [facility], [address], [place], [place] [address]   Protime-INR     Status: None   Collection Time: 07/10/24  2:05 PM  Result Value Ref Range   Prothrombin Time 13.7 11.4 - 15.2 seconds   INR 1.0 0.8 - 1.2    Comment: (NOTE) INR goal varies based on device and disease states. Performed at [facility], [address] [place], [place] [address]   APTT     Status: None    Collection Time: 07/10/24  2:05 PM  Result Value Ref Range   aPTT 28 24 - 36 seconds    Comment: Performed at [facility], [address], [place], [place] [address]  CBC     Status: Abnormal   Collection Time: 07/10/24  2:05 PM  Result Value Ref Range   WBC 7.8 4.0 - 10.5 K/uL   RBC 3.96 (L) 4.22 - 5.81 MIL/uL   Hemoglobin 12.1 (L) 13.0 - 17.0 g/dL   HCT 61.9 (L) 60.9 - 47.9 %   MCV 96.0 80.0 - 100.0 fL   MCH 30.6 26.0 - 34.0 pg   MCHC 31.8 30.0 - 36.0 g/dL   RDW 86.7 88.4 - 84.4 %   Platelets 278 150 - 400 K/uL   nRBC 0.0 0.0 - 0.2 %    Comment: Performed at [facility], [address] [place], [place] [address]  Differential     Status: None   Collection Time: 07/10/24  2:05 PM  Result Value Ref Range   Neutrophils Relative % 63 %   Neutro Abs 4.9 1.7 - 7.7 K/uL   Lymphocytes Relative 27 %   Lymphs Abs 2.1 0.7 - 4.0 K/uL   Monocytes Relative 8 %   Monocytes Absolute 0.6 0.1 - 1.0 K/uL   Eosinophils Relative 1 %   Eosinophils Absolute 0.1 0.0 - 0.5 K/uL   Basophils Relative 1 %   Basophils Absolute 0.1 0.0 - 0.1 K/uL   Immature Granulocytes 0 %   Abs Immature Granulocytes 0.02 0.00 - 0.07 K/uL    Comment: Performed at [facility], [address] [place], [place] [address]  Comprehensive metabolic panel     Status: Abnormal   Collection Time: 07/10/24  2:05 PM  Result Value Ref  Range   Sodium 143 135 - 145 mmol/L   Potassium 4.1 3.5 - 5.1 mmol/L   Chloride 109 98 - 111 mmol/L   CO2 21 (L) 22 - 32 mmol/L   Glucose, Bld 108 (H) 70 - 99 mg/dL    Comment: Glucose reference range applies only to samples taken after fasting for at least 8 hours.   BUN 34 (H) 8 - 23 mg/dL   Creatinine, Ser 7.72 (H) 0.61 - 1.24 mg/dL   Calcium 9.6 8.9 - 89.6 mg/dL   Total Protein 6.9 6.5 - 8.1 g/dL   Albumin 4.2 3.5 - 5.0 g/dL   AST 26 15 - 41 U/L   ALT 14 0 - 44 U/L   Alkaline Phosphatase 49 38 - 126 U/L   Total Bilirubin  0.5 0.0 - 1.2 mg/dL   GFR, Estimated 27 (L) >60 mL/min    Comment: (NOTE) Calculated using the CKD-EPI Creatinine Equation (2021)    Anion gap 13 5 - 15    Comment: Performed at [facility], [address] [place], [place] [address]  TSH + free T4     Status: Abnormal   Collection Time: 08/05/24  9:27 AM  Result Value Ref Range   TSH 9.400 (H) 0.450 - 4.500 uIU/mL   Free T4 1.48 0.82 - 1.77 ng/dL  PSA     Status: None   Collection Time: 08/05/24  9:27 AM  Result Value Ref Range   Prostate Specific Ag, Serum <0.1 0.0 - 4.0 ng/mL    Comment: **Verified by repeat analysis** Roche ECLIA methodology. According to the American Urological Association, Serum PSA should decrease and remain at undetectable levels after radical prostatectomy. The AUA defines biochemical recurrence as an initial PSA value 0.2 ng/mL or greater followed by a subsequent confirmatory PSA value 0.2 ng/mL or greater. Values obtained with different assay methods or kits cannot be used interchangeably. Results cannot be interpreted as absolute evidence of the presence or absence of malignant disease.   TSH + free T4     Status: Abnormal   Collection Time: 08/15/24  9:10 AM  Result Value Ref Range   TSH 7.190 (H) 0.450 - 4.500 uIU/mL   Free T4 1.44 0.82 - 1.77 ng/dL    Assessment/ Plan: 88 y.o. male   Benign hypertension with chronic kidney disease, stage IV (HCC) - Plan: amLODipine  (NORVASC ) 5 MG tablet  Psoriasis  Acquired hypothyroidism  History of prostate cancer  Assessment and Plan    Hypertension Blood pressure controlled with current regimen. Home readings within target. No edema with amlodipine .   - Continue valsartan , hydrochlorothiazide , and amlodipine . - Discontinue doxazosin. - Update amlodipine  to 5 mg daily with refills.  Hypothyroidism TSH decreased from 9.4 to 7.19, showing improvement. Trend reassuring despite early lab draw. - Repeat TSH in 4-6 months unless  develops symptoms of hypothyroidism.  FT4 in acceptable range both lab draws.  History of prostate cancer, status post treatment; Urinary flow stable with tamsulosin. No symptom change after discontinuing doxazosin. PSA undetectable, indicating no recurrence. - Ensure PSA results communicated to Dr. Watt. - Continue tamsulosin for urinary symptoms.  Psoriasis Flare on left ankle improved with triamcinolone . Rash resolving. - Continue triamcinolone  cream as needed for flare-ups.      Norene CHRISTELLA Fielding, [facility] [phone]

## 2024-08-20 DIAGNOSIS — R3912 Poor urinary stream: Secondary | ICD-10-CM | POA: Diagnosis not present

## 2024-08-26 NOTE — Telephone Encounter (Signed)
 I called and spoke to patient and made him aware. He voiced understanding and scheduled a lab appt on 9/23 to have thyroid  rechecked.

## 2024-09-16 ENCOUNTER — Other Ambulatory Visit

## 2024-09-16 DIAGNOSIS — E039 Hypothyroidism, unspecified: Secondary | ICD-10-CM

## 2024-09-17 ENCOUNTER — Ambulatory Visit: Payer: Self-pay | Admitting: Family Medicine

## 2024-09-17 LAB — TSH+FREE T4
Free T4: 1.44 ng/dL (ref 0.82–1.77)
TSH: 4.37 u[IU]/mL (ref 0.450–4.500)

## 2024-10-15 ENCOUNTER — Ambulatory Visit: Admitting: *Deleted

## 2024-10-15 DIAGNOSIS — Z23 Encounter for immunization: Secondary | ICD-10-CM | POA: Diagnosis not present

## 2024-10-15 NOTE — Progress Notes (Signed)
 Patient is in office today for a nurse visit for Immunization. Patient Injection was given in the  Left deltoid. Patient tolerated injection well.

## 2024-11-19 LAB — LAB REPORT - SCANNED: EGFR: 32

## 2025-01-15 ENCOUNTER — Ambulatory Visit: Payer: Self-pay

## 2025-01-15 NOTE — Telephone Encounter (Signed)
 FYI Only or Action Required?: FYI only for provider: appointment scheduled on 1/23.  Patient was last seen in primary care on 08/19/2024 by Jolinda Norene HERO, DO.  Called Nurse Triage reporting Cough.  Symptoms began several days ago.  Interventions attempted: OTC medications: Robitussin, Alka Seltzer and Rest, hydration, or home remedies.  Symptoms are: gradually worsening.  Triage Disposition: See Physician Within 24 Hours  Patient/caregiver understands and will follow disposition?: Yes  Message from [facility] sent at 01/15/2025  3:58 PM EST  Reason for Triage: Patient called earlier and left a message to provider for medication. Patient calling back states he has a fever,extreme head congestion , discolored mucus from his nose, a very painful cough that's so deep it makes him vomit .   Reason for Disposition  Fever present > 3 days (72 hours)  Answer Assessment - Initial Assessment Questions Patient called earlier and was told there are no appts today and asked for a CB from Nursing- no notes in chart to reflect that. Was told to call back at 4pm if hasn't heard from nursing. Thinks he spoke to a Olam in the office  Patient with symptoms since Tuesday- cough, sinus congestion, runny nose, low grade fever (99), body aches, productive cough (green/brown mucous). Has coughed so hard he's thrown up. Sinus headache in the forehead and cheeks. Robitussin and alka seltzer gel caps.minimal relief. Daughter reports he is out of bed the most today so far- otherwise has been resting a lot.   Denies known sick contacts- mostly stays home.   Appt with PCP in AM to assess. Concerned it will develop into Pneumonia which is why he called this morning. ED precautions understood.   1. ONSET: When did the nasal discharge start?      Tuesday  2. AMOUNT: How much discharge is there?      Moderate discharge and congestions  3. COUGH: Do you have a cough? If Yes, ask: Describe the color of  your mucus. (e.g., clear, white, yellow, green)     Productive cough- dark green brown thick  4. RESPIRATORY DISTRESS: Describe your breathing.      denies 5. FEVER: Do you have a fever? If Yes, ask: What is your temperature, how was it measured, and when did it start?     Low grade fever- 99 degrees 6. SEVERITY: Overall, how bad are you feeling right now? (e.g., doesn't interfere with normal activities, staying home from school/work, staying in bed)      Been sleeping a lot.  7. OTHER SYMPTOMS: Do you have any other symptoms? (e.g., earache, mouth sores, sore throat, wheezing)     Headache  Protocols used: Common Cold-A-AH

## 2025-01-16 ENCOUNTER — Ambulatory Visit: Admitting: Family Medicine

## 2025-01-16 ENCOUNTER — Encounter: Payer: Self-pay | Admitting: Family Medicine

## 2025-01-16 VITALS — BP 163/73 | HR 86 | Temp 98.4°F | Ht 67.0 in | Wt 216.2 lb

## 2025-01-16 DIAGNOSIS — I10 Essential (primary) hypertension: Secondary | ICD-10-CM

## 2025-01-16 DIAGNOSIS — J069 Acute upper respiratory infection, unspecified: Secondary | ICD-10-CM

## 2025-01-16 LAB — VERITOR SARS-COV-2 AND FLU A+B
BD Veritor SARS-CoV-2 Ag: NEGATIVE
Influenza A: NEGATIVE
Influenza B: NEGATIVE

## 2025-01-16 MED ORDER — AMOXICILLIN-POT CLAVULANATE 875-125 MG PO TABS: 1.0000 | ORAL_TABLET | Freq: Two times a day (BID) | ORAL | 0 refills | Status: AC

## 2025-01-16 MED ORDER — ONDANSETRON HCL 4 MG PO TABS: 4.0000 mg | ORAL_TABLET | Freq: Three times a day (TID) | ORAL | 0 refills | Status: AC | PRN

## 2025-01-16 NOTE — Progress Notes (Signed)
 "  Acute Office Visit  Subjective:     [name], male    DOB: 12-Aug-1936, 89 y.o.   MRN: [id number]  Chief Complaint  Patient presents with   Cough    HPI  History of Present Illness   Keith Wiley is an 89 year old male who presents with worsening cough and respiratory symptoms.  Cough and sputum production - Persistent cough since Tuesday, worsening slightly over time - Sputum production described as yellow colored - Cough occasionally induces gag reflex  Upper respiratory symptoms - Nasal congestion and rhinorrhea present - Headaches and generalized myalgias - No sore throat, otalgia, or chills  Fever and constitutional symptoms - Low-grade fever with temperatures of 98.67F on Wednesday and 56F on Thursday - No temperature checked today  Respiratory distress - Shortness of breath present with exertion - No chest pain  Gastrointestinal symptoms - No nausea, vomiting, or diarrhea  Medication use and response - Taking over-the-counter medications including Alka Seltzer cold and Robitussin - No improvement with current medications  Infectious exposure - No recent contact with sick individuals  Antibiotic allergies and tolerances - Able to tolerate augmentin  when taken with Zofran  to prevent nausea  History of pneumonia - History of pneumonia, with two prior episodes       ROS As per HPI.     Objective:    BP (!) 163/73   Pulse 86   Temp 98.4 F (36.9 C) (Temporal)   Ht 5' 7 (1.702 m)   Wt 216 lb 3.2 oz (98.1 kg)   SpO2 99%   BMI 33.86 kg/m    Physical Exam Vitals and nursing note reviewed.  Constitutional:      General: Keith Wiley is not in acute distress.    Appearance: Keith Wiley is obese. Keith Wiley is not ill-appearing, toxic-appearing or diaphoretic.  HENT:     Head: Normocephalic and atraumatic.     Right Ear: Tympanic membrane, ear canal and external ear normal.     Left Ear: Tympanic membrane, ear canal and external ear normal.      Nose: Congestion present.     Mouth/Throat:     Mouth: Mucous membranes are moist.     Pharynx: Oropharynx is clear. No pharyngeal swelling, posterior oropharyngeal erythema, uvula swelling or postnasal drip.     Tonsils: No tonsillar exudate or tonsillar abscesses. 1+ on the right. 1+ on the left.  Eyes:     General:        Right eye: No discharge.        Left eye: No discharge.     Conjunctiva/sclera: Conjunctivae normal.  Cardiovascular:     Rate and Rhythm: Normal rate and regular rhythm.     Heart sounds: Normal heart sounds. No murmur heard. Pulmonary:     Effort: Pulmonary effort is normal. No respiratory distress.     Breath sounds: Normal breath sounds. No wheezing, rhonchi or rales.  Abdominal:     General: Bowel sounds are normal. There is no distension.     Palpations: Abdomen is soft.     Tenderness: There is no abdominal tenderness.  Musculoskeletal:     Cervical back: Neck supple. No rigidity.     Right lower leg: No edema.     Left lower leg: No edema.  Skin:    General: Skin is warm and dry.  Neurological:     Mental Status: Keith Wiley is alert and oriented to person, place, and time. Mental status is at  baseline.  Psychiatric:        Mood and Affect: Mood normal.        Behavior: Behavior normal.     No results found for any visits on 01/16/25.      Assessment & Plan:   Keith Wiley was seen today for cough.  Diagnoses and all orders for this visit:  Upper respiratory tract infection, unspecified type -     Veritor SARS-CoV-2 and Flu A+B -     amoxicillin -clavulanate (AUGMENTIN ) 875-125 MG tablet; Take 1 tablet by mouth 2 (two) times daily for 7 days. -     ondansetron  (ZOFRAN ) 4 MG tablet; Take 1 tablet (4 mg total) by mouth every 8 (eight) hours as needed for nausea or vomiting.  Primary hypertension   Assessment and Plan    Acute upper respiratory infection Negative COVID and flu tests today. Lungs clear on exam. Discussed likely viral etiology. Rx for  Augmentin  provided to have on hand over the weekend due to potential winter weather. Instructed to start for worsening symptoms or if symptoms do not improve after 1 week.  - Prescribed Augmentin  with Zofran  for nausea.  - Advised rest and symptom monitoring. - Instructed to seek medical attention if symptoms worsen or do not improve.     HTN BP not at goal. Likely due to decongestant use. Expected BP will normalize after discontinuation.   Return to office for new or worsening symptoms, or if symptoms persist.   The patient indicates understanding of these issues and agrees with the plan.  Annabella CHRISTELLA Search, FNP   "

## 2025-04-14 ENCOUNTER — Encounter: Payer: Self-pay | Admitting: Family Medicine
